# Patient Record
Sex: Female | Born: 1975
Health system: Southern US, Community
[De-identification: ages and names within clinical notes are randomized; demographics above are authoritative.]

## PROBLEM LIST (undated history)

## (undated) DIAGNOSIS — Z9889 Other specified postprocedural states: Secondary | ICD-10-CM

## (undated) DIAGNOSIS — C801 Malignant (primary) neoplasm, unspecified: Secondary | ICD-10-CM

## (undated) DIAGNOSIS — C50919 Malignant neoplasm of unspecified site of unspecified female breast: Secondary | ICD-10-CM

## (undated) DIAGNOSIS — Z9221 Personal history of antineoplastic chemotherapy: Secondary | ICD-10-CM

## (undated) DIAGNOSIS — Z923 Personal history of irradiation: Secondary | ICD-10-CM

## (undated) DIAGNOSIS — R112 Nausea with vomiting, unspecified: Secondary | ICD-10-CM

## (undated) HISTORY — PX: BREAST LUMPECTOMY: SHX2

---

## 2005-09-15 ENCOUNTER — Other Ambulatory Visit: Admission: RE | Admit: 2005-09-15 | Discharge: 2005-09-15 | Payer: Self-pay | Admitting: Obstetrics and Gynecology

## 2007-05-30 ENCOUNTER — Encounter (INDEPENDENT_AMBULATORY_CARE_PROVIDER_SITE_OTHER): Payer: Self-pay | Admitting: Obstetrics and Gynecology

## 2007-05-30 ENCOUNTER — Ambulatory Visit (HOSPITAL_COMMUNITY): Admission: RE | Admit: 2007-05-30 | Discharge: 2007-05-30 | Payer: Self-pay | Admitting: Obstetrics and Gynecology

## 2010-01-07 ENCOUNTER — Encounter (INDEPENDENT_AMBULATORY_CARE_PROVIDER_SITE_OTHER): Payer: Self-pay | Admitting: Obstetrics & Gynecology

## 2010-01-07 ENCOUNTER — Inpatient Hospital Stay (HOSPITAL_COMMUNITY): Admission: AD | Admit: 2010-01-07 | Discharge: 2010-01-10 | Payer: Self-pay | Admitting: Obstetrics and Gynecology

## 2010-11-22 LAB — CBC
HCT: 30.4 % — ABNORMAL LOW (ref 36.0–46.0)
HCT: 36.8 % (ref 36.0–46.0)
Hemoglobin: 10.9 g/dL — ABNORMAL LOW (ref 12.0–15.0)
Hemoglobin: 13.2 g/dL (ref 12.0–15.0)
MCHC: 35.7 g/dL (ref 30.0–36.0)
MCHC: 35.8 g/dL (ref 30.0–36.0)
MCV: 100.2 fL — ABNORMAL HIGH (ref 78.0–100.0)
MCV: 98.4 fL (ref 78.0–100.0)
Platelets: 220 10*3/uL (ref 150–400)
Platelets: 256 10*3/uL (ref 150–400)
RBC: 3.03 MIL/uL — ABNORMAL LOW (ref 3.87–5.11)
RBC: 3.74 MIL/uL — ABNORMAL LOW (ref 3.87–5.11)
RDW: 13.7 % (ref 11.5–15.5)
RDW: 13.8 % (ref 11.5–15.5)
WBC: 11.6 10*3/uL — ABNORMAL HIGH (ref 4.0–10.5)
WBC: 20.1 10*3/uL — ABNORMAL HIGH (ref 4.0–10.5)

## 2010-11-22 LAB — CCBB MATERNAL DONOR DRAW

## 2010-11-22 LAB — RPR: RPR Ser Ql: NONREACTIVE

## 2011-01-17 NOTE — Op Note (Signed)
NAME:  Autumn Bullock, Autumn Bullock               ACCOUNT NO.:  [id number]   MEDICAL RECORD NO.:  [id number]          PATIENT TYPE:  AMB   LOCATION:  SDC                           FACILITY:  [facility]   PHYSICIAN:  Miguel Aschoff, M.D.       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   PROCEDURE:  Cold knife conization of cervix followed by endocervical  curettage.   SURGEON:  Miguel Aschoff, MD   ANESTHESIA:  General.   COMPLICATIONS:  None.   JUSTIFICATION:  The patient is a 35 year old white female who had a Pap  smear evaluation done, which revealed a high-grade squamous  intraepithelial lesion, CIN III, could not rule out CIS.  Because of the  significant high-grade lesion on her cervix, she presents now to undergo  cold knife conization of the cervix for both therapeutic and diagnostic  purposes and to rule out invasive disease.  The risks and benefits of  this procedure were discussed with the patient.   PROCEDURE:  The patient was taken to the operating room and placed in a  supine position and general anesthesia was administered without  difficulty.  She was then placed into the dorsal lithotomy position,  prepped and draped in the usual sterile fashion.  Bladder was  catheterized.  After this was done, speculum was placed in the vaginal  vault and the anterior cervical lip was grasped with a tenaculum.  At  this point, figure-of-eight of 0 chromic were placed between the 2  o'clock and 4 o'clock positions and the 8 o'clock and 10 o'clock  positions to occlude the descending branch of the cervical artery.  At  this point, the cervix was stained with Lugol's solution.  There was  poor Lugol's solution at the transformation zone.  There was diffuse  uptake over the  major portion of the cervix.  At this point, the cervix  was injected with 1% Xylocaine with epinephrine for hemostasis.  After  this was done, a cone biopsy specimen was then cut without difficulty  and tagged at the 12 o'clock position with a Vicryl suture.  The  residual portion of the endocervix was then curetted using an  endocervical curette.  The bed of the cone biopsy site was then  cauterized with electrocautery; excellent hemostasis was achieved.  At  this point, a Gelfoam pack was placed into the defect and the previously  placed sutures were tied across the cervix to hold the Gelfoam in place.  The estimated blood loss was less than 10 mL.  The patient tolerated the  procedure well.   Plan is for the patient be discharged home.  Medications for home  include Darvocet-N 100 one every 4 hours as needed for pain, doxycycline  100 mg twice a day for 3 days.  She is to call if there are any  problems  such as fever, pain or heavy bleeding.  She will be seen back in 4 weeks  for a followup examination.  She was told to place nothing in vagina for  4 weeks.      Miguel Aschoff, M.D.  Electronically Signed     AR/MEDQ  D:  05/30/2007  T:  05/31/2007  Job:  [id number]

## 2011-06-15 LAB — CBC
HCT: 39.2
Hemoglobin: 13.9
MCHC: 35.4
MCV: 97.2
Platelets: 284
RBC: 4.04
RDW: 12.8
WBC: 6.1

## 2011-06-15 LAB — PREGNANCY, URINE: Preg Test, Ur: NEGATIVE

## 2013-06-05 ENCOUNTER — Other Ambulatory Visit: Payer: Self-pay | Admitting: Obstetrics and Gynecology

## 2014-08-10 ENCOUNTER — Other Ambulatory Visit: Payer: Self-pay | Admitting: Obstetrics and Gynecology

## 2014-08-12 LAB — CYTOLOGY - PAP

## 2015-09-15 ENCOUNTER — Other Ambulatory Visit: Payer: Self-pay | Admitting: Obstetrics and Gynecology

## 2015-09-16 LAB — CYTOLOGY - PAP

## 2016-10-04 ENCOUNTER — Other Ambulatory Visit: Payer: Self-pay | Admitting: Obstetrics and Gynecology

## 2016-10-05 LAB — CYTOLOGY - PAP

## 2020-06-07 ENCOUNTER — Other Ambulatory Visit: Payer: Self-pay | Admitting: Obstetrics and Gynecology

## 2020-06-07 DIAGNOSIS — R928 Other abnormal and inconclusive findings on diagnostic imaging of breast: Secondary | ICD-10-CM

## 2020-06-08 ENCOUNTER — Other Ambulatory Visit: Payer: Self-pay | Admitting: Obstetrics and Gynecology

## 2020-06-08 ENCOUNTER — Ambulatory Visit
Admission: RE | Admit: 2020-06-08 | Discharge: 2020-06-08 | Disposition: A | Discharge status: Home / Self Care | Payer: BC Managed Care – PPO | Source: Ambulatory Visit | Attending: Obstetrics and Gynecology | Admitting: Obstetrics and Gynecology

## 2020-06-08 ENCOUNTER — Other Ambulatory Visit: Payer: Self-pay

## 2020-06-08 DIAGNOSIS — R928 Other abnormal and inconclusive findings on diagnostic imaging of breast: Secondary | ICD-10-CM

## 2020-06-08 DIAGNOSIS — N6310 Unspecified lump in the right breast, unspecified quadrant: Secondary | ICD-10-CM

## 2020-06-09 ENCOUNTER — Telehealth: Payer: Self-pay | Admitting: Oncology

## 2020-06-09 NOTE — Telephone Encounter (Signed)
Spoke with patient to confirm morning [facility] appointment for 10/13, will email packet to patient, explained surgeon's office will call

## 2020-06-11 ENCOUNTER — Encounter: Payer: Self-pay | Admitting: *Deleted

## 2020-06-11 DIAGNOSIS — Z170 Estrogen receptor positive status [ER+]: Secondary | ICD-10-CM

## 2020-06-11 DIAGNOSIS — C50411 Malignant neoplasm of upper-outer quadrant of right female breast: Secondary | ICD-10-CM

## 2020-06-15 NOTE — Progress Notes (Signed)
Radiation Oncology         [phone] ________________________________  Initial Outpatient Consultation  Name: Autumn Bullock MRN: [id number]  Date: 06/16/2020  DOB: 10-22-1975  XF:GHWEXH, Maida, PA-C  Donnie Mesa, MD   REFERRING PHYSICIAN: Donnie Mesa, MD  DIAGNOSIS:    ICD-10-CM   1. Malignant neoplasm of upper-outer quadrant of right breast in female, estrogen receptor positive ([place])  C50.411    Z17.0     Cancer Staging Malignant neoplasm of upper-outer quadrant of right breast in female, estrogen receptor positive ([place]) Staging form: Breast, AJCC 8th Edition - Clinical stage from 06/16/2020: Stage IB (cT2, cN0, cM0, G2, ER+, PR+, HER2-) - Unsigned  CHIEF COMPLAINT: Here to discuss management of right breast cancer  HISTORY OF PRESENT ILLNESS:Autumn Bullock is a 44 y.o. female who presented with right breast abnormality on the following imaging: bilateral screening mammogram on the date of 06/02/2020. No symptoms were reported at that time. Ultrasound of breast on 06/08/2020 revealed a suspicious 2.3 cm mass in the 11:30 o'clock location of the right breast and a suspicious 0.8 cm spiculated mass superficial to the larger mass. No right axillary lymphadenopathy. Biopsy on the date of 06/08/2020 showed invasive mammary carcinoma with mammary carcinoma in situ. E-cadherin was positive, supporting a ductal origin. ER status: 90% strong; PR status: 70% strong, Her2 status negative; Grade 2.  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  She has received her Covid vaccines.  She previously had Covid before getting vaccinated.  PREVIOUS RADIATION THERAPY: No  PAST MEDICAL HISTORY:  has a past medical history of Family history of uterine cancer (06/16/2020).    PAST SURGICAL HISTORY: Past Surgical History:  Procedure Laterality Date  . CESAREAN SECTION     2011    FAMILY HISTORY: family history includes Cancer (age of onset: 85) in her  maternal grandmother.  SOCIAL HISTORY:  reports that she has never smoked. She does not have any smokeless tobacco history on file. She reports current alcohol use. She reports that she does not use drugs.  ALLERGIES: Patient has no known allergies.  MEDICATIONS:  No current outpatient medications on file.   No current facility-administered medications for this encounter.    REVIEW OF SYSTEMS: As above.   PHYSICAL EXAM:  vitals were not taken for this visit.   General: Alert and oriented, in no acute distress HEENT: Head is normocephalic. Extraocular movements are intact.  Heart: Regular in rate and rhythm with no murmurs, rubs, or gallops. Chest: Clear to auscultation bilaterally, with no rhonchi, wheezes, or rales. Musculoskeletal: symmetric strength and muscle tone throughout. Neurologic:  No obvious focalities. Speech is fluent. Coordination is intact. Psychiatric: Judgment and insight are intact. Affect is appropriate. Breasts: Notable for at least 2 masses in the upper right breast with overlying ecchymoses; no other palpable masses appreciated in the breasts or axillae bilaterally.   ECOG = 0  0 - Asymptomatic (Fully active, able to carry on all predisease activities without restriction)  1 - Symptomatic but completely ambulatory (Restricted in physically strenuous activity but ambulatory and able to carry out work of a light or sedentary nature. For example, light housework, office work)  2 - Symptomatic, <50% in bed during the day (Ambulatory and capable of all self care but unable to carry out any work activities. Up and about more than 50% of waking hours)  3 - Symptomatic, >50% in bed, but not bedbound (Capable of only limited self-care, confined to bed  or chair 50% or more of waking hours)  4 - Bedbound (Completely disabled. Cannot carry on any self-care. Totally confined to bed or chair)  5 - Death   Santiago Glad MM, Creech RH, Tormey DC, et al. [phone]). "Toxicity and  response criteria of the [facility]". Am. Evlyn Clines. Oncol. 5 (6): 649-55   LABORATORY DATA:  Lab Results  Component Value Date   WBC 6.0 06/16/2020   HGB 13.5 06/16/2020   HCT 39.5 06/16/2020   MCV 96.6 06/16/2020   PLT 263 06/16/2020   CMP     Component Value Date/Time   NA 140 06/16/2020 0819   K 4.1 06/16/2020 0819   CL 104 06/16/2020 0819   CO2 30 06/16/2020 0819   GLUCOSE 123 (H) 06/16/2020 0819   BUN 15 06/16/2020 0819   CREATININE 0.83 06/16/2020 0819   CALCIUM 9.5 06/16/2020 0819   PROT 7.6 06/16/2020 0819   ALBUMIN 4.4 06/16/2020 0819   AST 18 06/16/2020 0819   ALT 15 06/16/2020 0819   ALKPHOS 64 06/16/2020 0819   BILITOT 0.4 06/16/2020 0819   GFRNONAA >60 06/16/2020 0819         RADIOGRAPHY: US BREAST LTD UNI RIGHT INC AXILLA  Result Date: 06/08/2020 CLINICAL DATA:  Patient returns after screening for evaluation possible RIGHT breast mass. EXAM: DIGITAL DIAGNOSTIC RIGHT MAMMOGRAM WITH CAD AND TOMO ULTRASOUND RIGHT BREAST COMPARISON:  Previous exam(s). ACR Breast Density Category c: The breast tissue is heterogeneously dense, which may obscure small masses. FINDINGS: Additional 2-D and 3-D images are performed. These views confirm presence of a spiculated mass in the UPPER-OUTER QUADRANT of the RIGHT breast. No definitive abnormality identified in the retroareolar region. Mammographic images were processed with CAD. On physical exam, I palpate focal soft thickening in the 11:30 location of the RIGHT breast. I palpate no discrete mass. Targeted ultrasound is performed, showing irregular mass with indistinct margins in the 11:30 o'clock location of the RIGHT breast 3 centimeters from the nipple. Mass is 2.3 x 1.5 x 1.7 centimeters. No blood flow identified on Doppler evaluation. Just superficial to this mass, also in the 11:30 o'clock location 3 centimeters from the nipple, there is a spiculated solid mass with internal blood flow measuring 0.8 x 0.6  x 0.7 centimeters. In the 11:30 o'clock location of the RIGHT breast 4 centimeters from the nipple, there is an anechoic mass with internal septations measuring 0.8 x 0.5 x 0.8 centimeters. In the 12 o'clock location 3 centimeters from the nipple, there is a simple cyst measuring 0.6 centimeters. Evaluation of the RIGHT axilla shows no adenopathy. IMPRESSION: 1. Suspicious 2.3 centimeter mass in the 11:30 o'clock location of the RIGHT breast warranting tissue diagnosis. 2. Suspicious 0.8 centimeter spiculated mass superficial to the larger mass also warranting tissue diagnosis. 3. No RIGHT axillary adenopathy. RECOMMENDATION: 1. Recommend ultrasound-guided core biopsy of mass in the 11:30 o'clock location of the RIGHT breast. 2. Recommend ultrasound-guided core biopsy of more superficial mass in the 11:30 o'clock location of the RIGHT breast. 3. Biopsies will be performed later today. I have discussed the findings and recommendations with the patient. If applicable, a reminder letter will be sent to the patient regarding the next appointment. BI-RADS CATEGORY  4: Suspicious. Electronically Signed   By: Norva Pavlov M.D.   On: 06/08/2020 12:20   MM DIAG BREAST TOMO UNI RIGHT  Result Date: 06/08/2020 CLINICAL DATA:  Patient returns after screening for evaluation possible RIGHT breast mass. EXAM: DIGITAL DIAGNOSTIC RIGHT  MAMMOGRAM WITH CAD AND TOMO ULTRASOUND RIGHT BREAST COMPARISON:  Previous exam(s). ACR Breast Density Category c: The breast tissue is heterogeneously dense, which may obscure small masses. FINDINGS: Additional 2-D and 3-D images are performed. These views confirm presence of a spiculated mass in the UPPER-OUTER QUADRANT of the RIGHT breast. No definitive abnormality identified in the retroareolar region. Mammographic images were processed with CAD. On physical exam, I palpate focal soft thickening in the 11:30 location of the RIGHT breast. I palpate no discrete mass. Targeted ultrasound is  performed, showing irregular mass with indistinct margins in the 11:30 o'clock location of the RIGHT breast 3 centimeters from the nipple. Mass is 2.3 x 1.5 x 1.7 centimeters. No blood flow identified on Doppler evaluation. Just superficial to this mass, also in the 11:30 o'clock location 3 centimeters from the nipple, there is a spiculated solid mass with internal blood flow measuring 0.8 x 0.6 x 0.7 centimeters. In the 11:30 o'clock location of the RIGHT breast 4 centimeters from the nipple, there is an anechoic mass with internal septations measuring 0.8 x 0.5 x 0.8 centimeters. In the 12 o'clock location 3 centimeters from the nipple, there is a simple cyst measuring 0.6 centimeters. Evaluation of the RIGHT axilla shows no adenopathy. IMPRESSION: 1. Suspicious 2.3 centimeter mass in the 11:30 o'clock location of the RIGHT breast warranting tissue diagnosis. 2. Suspicious 0.8 centimeter spiculated mass superficial to the larger mass also warranting tissue diagnosis. 3. No RIGHT axillary adenopathy. RECOMMENDATION: 1. Recommend ultrasound-guided core biopsy of mass in the 11:30 o'clock location of the RIGHT breast. 2. Recommend ultrasound-guided core biopsy of more superficial mass in the 11:30 o'clock location of the RIGHT breast. 3. Biopsies will be performed later today. I have discussed the findings and recommendations with the patient. If applicable, a reminder letter will be sent to the patient regarding the next appointment. BI-RADS CATEGORY  4: Suspicious. Electronically Signed   By: Nolon Nations M.D.   On: 06/08/2020 12:20   MM CLIP PLACEMENT RIGHT  Result Date: 06/08/2020 CLINICAL DATA:  Status post ultrasound-guided core biopsies of 2 masses in the RIGHT breast at the 11:30 o'clock axis. EXAM: DIAGNOSTIC RIGHT MAMMOGRAM POST ULTRASOUND BIOPSY x2 COMPARISON:  Previous exam(s). FINDINGS: Mammographic images were obtained following ultrasound guided biopsy of 2 adjacent masses in the RIGHT breast  at the 11:30 o'clock axis. Both biopsy marking clips are in expected position at the sites of the biopsies. IMPRESSION: 1. Appropriate positioning of the ribbon shaped biopsy marking clip at the site of biopsy in the upper-outer quadrant of the RIGHT breast. 2. Appropriate positioning of the coil shaped biopsy marking clip at the site of biopsy in the upper-outer quadrant of the RIGHT breast. Final Assessment: Post Procedure Mammograms for Marker Placement Electronically Signed   By: Franki Cabot M.D.   On: 06/08/2020 16:03   [place] RT BREAST BX W LOC DEV 1ST LESION IMG BX SPEC US GUIDE  Addendum Date: 06/10/2020   ADDENDUM REPORT: 06/09/2020 11:16 ADDENDUM: Pathology revealed GRADE II INVASIVE MAMMARY CARCINOMA, MAMMARY CARCINOMA IN SITU of the RIGHT breast, 11:30 o'clock axis. This was found to be concordant by Dr. Franki Cabot. Pathology revealed GRADE II INVASIVE MAMMARY CARCINOMA, MAMMARY CARCINOMA IN SITU of the RIGHT breast, 11:30 o'clock axis. This was found to be concordant by Dr. Franki Cabot. Pathology results were discussed with the patient by telephone. The patient reported doing well after the biopsies with tenderness at the sites. Post biopsy instructions and care were reviewed and  questions were answered. The patient was encouraged to call [facility] for any additional concerns. The patient was referred to [facility] at [facility] on June 16, 2020. Pathology results reported by Stacie Acres RN on 06/09/2020. Electronically Signed   By: Franki Cabot M.D.   On: 06/09/2020 11:16   Result Date: 06/10/2020 CLINICAL DATA:  Patient with 2 adjacent suspicious masses within the outer RIGHT breast, both at the 11:30 o'clock axis, presents for ultrasound-guided core biopsies of each. EXAM: ULTRASOUND GUIDED RIGHT BREAST CORE NEEDLE BIOPSY x2 COMPARISON:  Previous exam(s). PROCEDURE: I met with the patient and we  discussed the procedure of ultrasound-guided biopsy, including benefits and alternatives. We discussed the high likelihood of a successful procedure. We discussed the risks of the procedure, including infection, bleeding, tissue injury, clip migration, and inadequate sampling. Informed written consent was given. The usual time-out protocol was performed immediately prior to the procedure. Site 1: Lesion quadrant: Upper outer quadrant Using sterile technique and 1% Lidocaine as local anesthetic, under direct ultrasound visualization, a 12 gauge spring-loaded device was used to perform biopsy of the smaller more superficial RIGHT breast mass at the 11:30 o'clock axis using a lateral approach. At the conclusion of the procedure ribbon shaped tissue marker clip was deployed into the biopsy cavity. Site 2: Lesion quadrant: Upper outer quadrant Using sterile technique and 1% Lidocaine as local anesthetic, under direct ultrasound visualization, a 12 gauge spring-loaded device was used to perform biopsy of the larger deeper mass in the RIGHT breast at the 11:30 o'clock using a lateral approach. At the conclusion of the procedure coil shaped tissue marker clip was deployed into the biopsy cavity. Follow up 2 view mammogram was performed and dictated separately. IMPRESSION: 1. Ultrasound guided biopsy of the smaller more superficial mass in the RIGHT breast at the 11:30 o'clock axis (ribbon clip). No apparent complications. 2. Ultrasound guided biopsy of the larger deeper mass in the RIGHT breast at the 11:30 o'clock axis (coil clip). No apparent complications. Electronically Signed: By: Franki Cabot M.D. On: 06/08/2020 15:49   [place] RT BREAST BX W LOC DEV EA ADD LESION IMG BX SPEC US GUIDE  Addendum Date: 06/10/2020   ADDENDUM REPORT: 06/09/2020 11:16 ADDENDUM: Pathology revealed GRADE II INVASIVE MAMMARY CARCINOMA, MAMMARY CARCINOMA IN SITU of the RIGHT breast, 11:30 o'clock axis. This was found to be concordant by Dr.  Franki Cabot. Pathology revealed GRADE II INVASIVE MAMMARY CARCINOMA, MAMMARY CARCINOMA IN SITU of the RIGHT breast, 11:30 o'clock axis. This was found to be concordant by Dr. Franki Cabot. Pathology results were discussed with the patient by telephone. The patient reported doing well after the biopsies with tenderness at the sites. Post biopsy instructions and care were reviewed and questions were answered. The patient was encouraged to call [facility] for any additional concerns. The patient was referred to [facility] on June 16, 2020. Pathology results reported by Stacie Acres RN on 06/09/2020. Electronically Signed   By: Franki Cabot M.D.   On: 06/09/2020 11:16   Result Date: 06/10/2020 CLINICAL DATA:  Patient with 2 adjacent suspicious masses within the outer RIGHT breast, both at the 11:30 o'clock axis, presents for ultrasound-guided core biopsies of each. EXAM: ULTRASOUND GUIDED RIGHT BREAST CORE NEEDLE BIOPSY x2 COMPARISON:  Previous exam(s). PROCEDURE: I met with the patient and we discussed the procedure of  ultrasound-guided biopsy, including benefits and alternatives. We discussed the high likelihood of a successful procedure. We discussed the risks of the procedure, including infection, bleeding, tissue injury, clip migration, and inadequate sampling. Informed written consent was given. The usual time-out protocol was performed immediately prior to the procedure. Site 1: Lesion quadrant: Upper outer quadrant Using sterile technique and 1% Lidocaine as local anesthetic, under direct ultrasound visualization, a 12 gauge spring-loaded device was used to perform biopsy of the smaller more superficial RIGHT breast mass at the 11:30 o'clock axis using a lateral approach. At the conclusion of the procedure ribbon shaped tissue marker clip was deployed into the biopsy cavity. Site 2: Lesion quadrant:  Upper outer quadrant Using sterile technique and 1% Lidocaine as local anesthetic, under direct ultrasound visualization, a 12 gauge spring-loaded device was used to perform biopsy of the larger deeper mass in the RIGHT breast at the 11:30 o'clock using a lateral approach. At the conclusion of the procedure coil shaped tissue marker clip was deployed into the biopsy cavity. Follow up 2 view mammogram was performed and dictated separately. IMPRESSION: 1. Ultrasound guided biopsy of the smaller more superficial mass in the RIGHT breast at the 11:30 o'clock axis (ribbon clip). No apparent complications. 2. Ultrasound guided biopsy of the larger deeper mass in the RIGHT breast at the 11:30 o'clock axis (coil clip). No apparent complications. Electronically Signed: By: Franki Cabot M.D. On: 06/08/2020 15:49      IMPRESSION/PLAN: Right Breast Cancer  The patient was discussed at tumor board.  The consensus is that she will be referred to genetic counseling.  She will undergo an MRI of her breasts for further staging.  Anticipate MammaPrint testing to discern her disposition regarding chemotherapy.  If MRI demonstrates that breast conserving surgery is possible the patient is enthusiastic about that.  Anticipate adjuvant radiotherapy in the setting of breast conservation.  It was a pleasure meeting the patient today. We discussed the risks, benefits, and side effects of radiotherapy. I recommend radiotherapy to the right breast to reduce her risk of locoregional recurrence by 2/3.  We discussed that radiation would take approximately 4 weeks to complete and that I would give the patient a few weeks to heal following surgery before starting treatment planning.  If chemotherapy were to be given, this would precede radiotherapy. We spoke about acute effects including skin irritation and fatigue as well as much less common late effects including internal organ injury or irritation. We spoke about the latest technology  that is used to minimize the risk of late effects for patients undergoing radiotherapy to the breast or chest wall. No guarantees of treatment were given. The patient is enthusiastic about proceeding with treatment. I look forward to participating in the patient's care.  I will await her referral back to me for postoperative follow-up and eventual CT simulation/treatment planning.  On date of service, in total, I spent 32 minutes on this encounter. Patient was seen in person.   __________________________________________   Eppie Gibson, MD  This document serves as a record of services personally performed by Eppie Gibson, MD. It was created on his behalf by Clerance Lav, a trained medical scribe. The creation of this record is based on the scribe's personal observations and the provider's statements to them. This document has been checked and approved by the attending provider.

## 2020-06-15 NOTE — Progress Notes (Signed)
[place]  [phone] [phone]     ID: Autumn Bullock DOB: [birth year]  MR#: [id number]  KGY#:[id number]  Patient Care Team: Brock Ra, PA-C as PCP - General Mauro Kaufmann, RN as Oncology Nurse Navigator Rockwell Germany, RN as Oncology Nurse Navigator Donnie Mesa, MD as Consulting Physician (General Surgery) Eppie Gibson, MD as Attending Physician (Radiation Oncology) Mahagony Grieb, Virgie Dad, MD as Consulting Physician (Oncology) Vanessa Kick, MD as Consulting Physician (Obstetrics and Gynecology) Lovett Calender, MD as Consulting Physician (Orthopedic Surgery) Chauncey Cruel, MD OTHER MD:  CHIEF COMPLAINT: Estrogen receptor positive breast cancer  CURRENT TREATMENT: Awaiting definitive surgery   HISTORY OF CURRENT ILLNESS: Hang had routine screening mammography on 06/02/2020 showing a possible abnormality in the right breast. She underwent right diagnostic mammography with tomography and right breast ultrasonography at [facility] on 06/08/2020 showing: breast density category C; 2.3 cm right breast mass at 11:30; 0.8 cm mass superficial to dominant mass; no right axillary adenopathy.  Accordingly on 06/08/2020 she proceeded to biopsy of the right breast area in question. The pathology from this procedure ([id number]) showed: invasive and in situ mammary carcinoma, grade 2, e-cadherin positive. Both biopsied masses showed this and were found to be morphologically similar. Prognostic indicators significant for: estrogen receptor, 90% positive and progesterone receptor, 70% positive, both with strong staining intensity. Proliferation marker Ki67 at 5%. HER2 negative by immunohistochemistry (1+).  The patient's subsequent history is as detailed below.   INTERVAL HISTORY: Autumn Bullock was evaluated in the multidisciplinary breast cancer clinic on 06/16/2020 accompanied by her husband [name]. Her case was also presented at the  multidisciplinary breast cancer conference on the same day. At that time a preliminary plan was proposed: Genetics testing, MRI, definitive surgery, MammaPrint, adjuvant radiation, antiestrogens   REVIEW OF SYSTEMS: There were no specific symptoms leading to the original mammogram, which was routinely scheduled. The patient denies unusual headaches, visual changes, nausea, vomiting, stiff neck, dizziness, or gait imbalance. There has been no cough, phlegm production, or pleurisy, no chest pain or pressure, and no change in bowel or bladder habits. The patient denies fever, rash, bleeding, unexplained fatigue or unexplained weight loss.  Lakesia exercises by going to the gym about 3 times a week and of course she is on her feet all day at work.  She had Covid and also has had both Pfizer vaccine doses.  A detailed review of systems was otherwise entirely negative.   PAST MEDICAL HISTORY: No past medical history on file.  PAST SURGICAL HISTORY: Past Surgical History:  Procedure Laterality Date  . CESAREAN SECTION     2011    FAMILY HISTORY: Family History  Problem Relation Age of Onset  . Uterine cancer Maternal Grandmother   As of October 2021 her parents are both living, her father at age 82 and her mother at 11, . Millenia has 1 brother and 2 sisters. She reports uterine cancer in her maternal grandmother at age 33. There is no family history of breast, ovarian, or colon cancer to her knowledge.   GYNECOLOGIC HISTORY:  No LMP recorded. Menarche: 44 years old Age at first live birth: 44 years old [place] P 1 LMP 05/05/2020, regular monthly periods, lasting 3 days with 1-2 heavy days Contraceptive: has used for 15 years, no complications HRT n/a  Hysterectomy? no BSO? 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 Husband [name] works in Nature conservation officer. She  lives at home with [name] and their daughter [name], age 16. She  attends [employer] in [place].    ADVANCED DIRECTIVES: In the absence of any documentation to the contrary, the patient's spouse is their HCPOA.    HEALTH MAINTENANCE: Social History   Tobacco Use  . Smoking status: Never Smoker  Substance Use Topics  . Alcohol use: Yes    Comment: 5/week  . Drug use: Never     Colonoscopy: 1999?  PAP: 05/31/2020  Bone density: never done   No Known Allergies  No current outpatient medications on file.   No current facility-administered medications for this visit.    OBJECTIVE: White woman who appears well  Vitals:   06/16/20 0857  BP: 115/76  Pulse: 73  Resp: 19  Temp: 98.2 F (36.8 C)  SpO2: 98%     Body mass index is 25.97 kg/m.   Wt Readings from Last 3 Encounters:  06/16/20 158 lb 8 oz (71.9 kg)      ECOG FS:0 - Asymptomatic  Ocular: Sclerae unicteric, pupils round and equal Ear-nose-throat: Wearing a mask Lymphatic: No cervical or supraclavicular adenopathy Lungs no rales or rhonchi Heart regular rate and rhythm Abd soft, nontender, positive bowel sounds MSK no focal spinal tenderness, no joint edema Neuro: non-focal, well-oriented, appropriate affect Breasts: The right breast is status post recent biopsy.  There is a significant ecchymosis.  Left breast is benign.  Both axillae are benign.   LAB RESULTS:  CMP     Component Value Date/Time   NA 140 06/16/2020 0819   K 4.1 06/16/2020 0819   CL 104 06/16/2020 0819   CO2 30 06/16/2020 0819   GLUCOSE 123 (H) 06/16/2020 0819   BUN 15 06/16/2020 0819   CREATININE 0.83 06/16/2020 0819   CALCIUM 9.5 06/16/2020 0819   PROT 7.6 06/16/2020 0819   ALBUMIN 4.4 06/16/2020 0819   AST 18 06/16/2020 0819   ALT 15 06/16/2020 0819   ALKPHOS 64 06/16/2020 0819   BILITOT 0.4 06/16/2020 0819   GFRNONAA >60 06/16/2020 0819    No results found for: TOTALPROTELP, ALBUMINELP, A1GS, A2GS, BETS, BETA2SER, GAMS, MSPIKE, SPEI  Lab Results  Component Value Date   WBC  6.0 06/16/2020   NEUTROABS 4.1 06/16/2020   HGB 13.5 06/16/2020   HCT 39.5 06/16/2020   MCV 96.6 06/16/2020   PLT 263 06/16/2020    No results found for: LABCA2  No components found for: OQHUTM546  No results for input(s): INR in the last 168 hours.  No results found for: LABCA2  No results found for: TKP546  No results found for: FKC127  No results found for: NTZ001  No results found for: CA2729  No components found for: HGQUANT  No results found for: CEA1 / No results found for: CEA1   No results found for: AFPTUMOR  No results found for: CHROMOGRNA  No results found for: KPAFRELGTCHN, LAMBDASER, KAPLAMBRATIO (kappa/lambda light chains)  No results found for: HGBA, HGBA2QUANT, HGBFQUANT, HGBSQUAN (Hemoglobinopathy evaluation)   No results found for: LDH  No results found for: IRON, TIBC, IRONPCTSAT (Iron and TIBC)  No results found for: FERRITIN  Urinalysis No results found for: COLORURINE, APPEARANCEUR, LABSPEC, PHURINE, GLUCOSEU, HGBUR, BILIRUBINUR, KETONESUR, PROTEINUR, UROBILINOGEN, NITRITE, LEUKOCYTESUR   STUDIES: US BREAST LTD UNI RIGHT INC AXILLA  Result Date: 06/08/2020 CLINICAL DATA:  Patient returns after screening for evaluation possible RIGHT breast mass. EXAM: DIGITAL DIAGNOSTIC RIGHT MAMMOGRAM WITH CAD AND TOMO ULTRASOUND RIGHT BREAST COMPARISON:  Previous exam(s).  ACR Breast Density Category c: The breast tissue is heterogeneously dense, which may obscure small masses. FINDINGS: Additional 2-D and 3-D images are performed. These views confirm presence of a spiculated mass in the UPPER-OUTER QUADRANT of the RIGHT breast. No definitive abnormality identified in the retroareolar region. Mammographic images were processed with CAD. On physical exam, I palpate focal soft thickening in the 11:30 location of the RIGHT breast. I palpate no discrete mass. Targeted ultrasound is performed, showing irregular mass with indistinct margins in the 11:30 o'clock  location of the RIGHT breast 3 centimeters from the nipple. Mass is 2.3 x 1.5 x 1.7 centimeters. No blood flow identified on Doppler evaluation. Just superficial to this mass, also in the 11:30 o'clock location 3 centimeters from the nipple, there is a spiculated solid mass with internal blood flow measuring 0.8 x 0.6 x 0.7 centimeters. In the 11:30 o'clock location of the RIGHT breast 4 centimeters from the nipple, there is an anechoic mass with internal septations measuring 0.8 x 0.5 x 0.8 centimeters. In the 12 o'clock location 3 centimeters from the nipple, there is a simple cyst measuring 0.6 centimeters. Evaluation of the RIGHT axilla shows no adenopathy. IMPRESSION: 1. Suspicious 2.3 centimeter mass in the 11:30 o'clock location of the RIGHT breast warranting tissue diagnosis. 2. Suspicious 0.8 centimeter spiculated mass superficial to the larger mass also warranting tissue diagnosis. 3. No RIGHT axillary adenopathy. RECOMMENDATION: 1. Recommend ultrasound-guided core biopsy of mass in the 11:30 o'clock location of the RIGHT breast. 2. Recommend ultrasound-guided core biopsy of more superficial mass in the 11:30 o'clock location of the RIGHT breast. 3. Biopsies will be performed later today. I have discussed the findings and recommendations with the patient. If applicable, a reminder letter will be sent to the patient regarding the next appointment. BI-RADS CATEGORY  4: Suspicious. Electronically Signed   By: Nolon Nations M.D.   On: 06/08/2020 12:20   MM DIAG BREAST TOMO UNI RIGHT  Result Date: 06/08/2020 CLINICAL DATA:  Patient returns after screening for evaluation possible RIGHT breast mass. EXAM: DIGITAL DIAGNOSTIC RIGHT MAMMOGRAM WITH CAD AND TOMO ULTRASOUND RIGHT BREAST COMPARISON:  Previous exam(s). ACR Breast Density Category c: The breast tissue is heterogeneously dense, which may obscure small masses. FINDINGS: Additional 2-D and 3-D images are performed. These views confirm presence of a  spiculated mass in the UPPER-OUTER QUADRANT of the RIGHT breast. No definitive abnormality identified in the retroareolar region. Mammographic images were processed with CAD. On physical exam, I palpate focal soft thickening in the 11:30 location of the RIGHT breast. I palpate no discrete mass. Targeted ultrasound is performed, showing irregular mass with indistinct margins in the 11:30 o'clock location of the RIGHT breast 3 centimeters from the nipple. Mass is 2.3 x 1.5 x 1.7 centimeters. No blood flow identified on Doppler evaluation. Just superficial to this mass, also in the 11:30 o'clock location 3 centimeters from the nipple, there is a spiculated solid mass with internal blood flow measuring 0.8 x 0.6 x 0.7 centimeters. In the 11:30 o'clock location of the RIGHT breast 4 centimeters from the nipple, there is an anechoic mass with internal septations measuring 0.8 x 0.5 x 0.8 centimeters. In the 12 o'clock location 3 centimeters from the nipple, there is a simple cyst measuring 0.6 centimeters. Evaluation of the RIGHT axilla shows no adenopathy. IMPRESSION: 1. Suspicious 2.3 centimeter mass in the 11:30 o'clock location of the RIGHT breast warranting tissue diagnosis. 2. Suspicious 0.8 centimeter spiculated mass superficial to the larger mass  also warranting tissue diagnosis. 3. No RIGHT axillary adenopathy. RECOMMENDATION: 1. Recommend ultrasound-guided core biopsy of mass in the 11:30 o'clock location of the RIGHT breast. 2. Recommend ultrasound-guided core biopsy of more superficial mass in the 11:30 o'clock location of the RIGHT breast. 3. Biopsies will be performed later today. I have discussed the findings and recommendations with the patient. If applicable, a reminder letter will be sent to the patient regarding the next appointment. BI-RADS CATEGORY  4: Suspicious. Electronically Signed   By: Nolon Nations M.D.   On: 06/08/2020 12:20   MM CLIP PLACEMENT RIGHT  Result Date: 06/08/2020 CLINICAL  DATA:  Status post ultrasound-guided core biopsies of 2 masses in the RIGHT breast at the 11:30 o'clock axis. EXAM: DIAGNOSTIC RIGHT MAMMOGRAM POST ULTRASOUND BIOPSY x2 COMPARISON:  Previous exam(s). FINDINGS: Mammographic images were obtained following ultrasound guided biopsy of 2 adjacent masses in the RIGHT breast at the 11:30 o'clock axis. Both biopsy marking clips are in expected position at the sites of the biopsies. IMPRESSION: 1. Appropriate positioning of the ribbon shaped biopsy marking clip at the site of biopsy in the upper-outer quadrant of the RIGHT breast. 2. Appropriate positioning of the coil shaped biopsy marking clip at the site of biopsy in the upper-outer quadrant of the RIGHT breast. Final Assessment: Post Procedure Mammograms for Marker Placement Electronically Signed   By: Franki Cabot M.D.   On: 06/08/2020 16:03   [place] RT BREAST BX W LOC DEV 1ST LESION IMG BX SPEC US GUIDE  Addendum Date: 06/10/2020   ADDENDUM REPORT: 06/09/2020 11:16 ADDENDUM: Pathology revealed GRADE II INVASIVE MAMMARY CARCINOMA, MAMMARY CARCINOMA IN SITU of the RIGHT breast, 11:30 o'clock axis. This was found to be concordant by Dr. Franki Cabot. Pathology revealed GRADE II INVASIVE MAMMARY CARCINOMA, MAMMARY CARCINOMA IN SITU of the RIGHT breast, 11:30 o'clock axis. This was found to be concordant by Dr. Franki Cabot. Pathology results were discussed with the patient by telephone. The patient reported doing well after the biopsies with tenderness at the sites. Post biopsy instructions and care were reviewed and questions were answered. The patient was encouraged to call [facility] for any additional concerns. The patient was referred to [facility] on June 16, 2020. Pathology results reported by Stacie Acres RN on 06/09/2020. Electronically Signed   By: Franki Cabot M.D.   On: 06/09/2020 11:16   Result Date:  06/10/2020 CLINICAL DATA:  Patient with 2 adjacent suspicious masses within the outer RIGHT breast, both at the 11:30 o'clock axis, presents for ultrasound-guided core biopsies of each. EXAM: ULTRASOUND GUIDED RIGHT BREAST CORE NEEDLE BIOPSY x2 COMPARISON:  Previous exam(s). PROCEDURE: I met with the patient and we discussed the procedure of ultrasound-guided biopsy, including benefits and alternatives. We discussed the high likelihood of a successful procedure. We discussed the risks of the procedure, including infection, bleeding, tissue injury, clip migration, and inadequate sampling. Informed written consent was given. The usual time-out protocol was performed immediately prior to the procedure. Site 1: Lesion quadrant: Upper outer quadrant Using sterile technique and 1% Lidocaine as local anesthetic, under direct ultrasound visualization, a 12 gauge spring-loaded device was used to perform biopsy of the smaller more superficial RIGHT breast mass at the 11:30 o'clock axis using a lateral approach. At the conclusion of the procedure ribbon shaped tissue marker clip was deployed into the biopsy cavity. Site 2: Lesion quadrant: Upper outer quadrant Using sterile technique and  1% Lidocaine as local anesthetic, under direct ultrasound visualization, a 12 gauge spring-loaded device was used to perform biopsy of the larger deeper mass in the RIGHT breast at the 11:30 o'clock using a lateral approach. At the conclusion of the procedure coil shaped tissue marker clip was deployed into the biopsy cavity. Follow up 2 view mammogram was performed and dictated separately. IMPRESSION: 1. Ultrasound guided biopsy of the smaller more superficial mass in the RIGHT breast at the 11:30 o'clock axis (ribbon clip). No apparent complications. 2. Ultrasound guided biopsy of the larger deeper mass in the RIGHT breast at the 11:30 o'clock axis (coil clip). No apparent complications. Electronically Signed: By: Franki Cabot M.D. On:  06/08/2020 15:49   [place] RT BREAST BX W LOC DEV EA ADD LESION IMG BX SPEC US GUIDE  Addendum Date: 06/10/2020   ADDENDUM REPORT: 06/09/2020 11:16 ADDENDUM: Pathology revealed GRADE II INVASIVE MAMMARY CARCINOMA, MAMMARY CARCINOMA IN SITU of the RIGHT breast, 11:30 o'clock axis. This was found to be concordant by Dr. Franki Cabot. Pathology revealed GRADE II INVASIVE MAMMARY CARCINOMA, MAMMARY CARCINOMA IN SITU of the RIGHT breast, 11:30 o'clock axis. This was found to be concordant by Dr. Franki Cabot. Pathology results were discussed with the patient by telephone. The patient reported doing well after the biopsies with tenderness at the sites. Post biopsy instructions and care were reviewed and questions were answered. The patient was encouraged to call [facility] for any additional concerns. The patient was referred to [facility] on June 16, 2020. Pathology results reported by Stacie Acres RN on 06/09/2020. Electronically Signed   By: Franki Cabot M.D.   On: 06/09/2020 11:16   Result Date: 06/10/2020 CLINICAL DATA:  Patient with 2 adjacent suspicious masses within the outer RIGHT breast, both at the 11:30 o'clock axis, presents for ultrasound-guided core biopsies of each. EXAM: ULTRASOUND GUIDED RIGHT BREAST CORE NEEDLE BIOPSY x2 COMPARISON:  Previous exam(s). PROCEDURE: I met with the patient and we discussed the procedure of ultrasound-guided biopsy, including benefits and alternatives. We discussed the high likelihood of a successful procedure. We discussed the risks of the procedure, including infection, bleeding, tissue injury, clip migration, and inadequate sampling. Informed written consent was given. The usual time-out protocol was performed immediately prior to the procedure. Site 1: Lesion quadrant: Upper outer quadrant Using sterile technique and 1% Lidocaine as local anesthetic, under direct  ultrasound visualization, a 12 gauge spring-loaded device was used to perform biopsy of the smaller more superficial RIGHT breast mass at the 11:30 o'clock axis using a lateral approach. At the conclusion of the procedure ribbon shaped tissue marker clip was deployed into the biopsy cavity. Site 2: Lesion quadrant: Upper outer quadrant Using sterile technique and 1% Lidocaine as local anesthetic, under direct ultrasound visualization, a 12 gauge spring-loaded device was used to perform biopsy of the larger deeper mass in the RIGHT breast at the 11:30 o'clock using a lateral approach. At the conclusion of the procedure coil shaped tissue marker clip was deployed into the biopsy cavity. Follow up 2 view mammogram was performed and dictated separately. IMPRESSION: 1. Ultrasound guided biopsy of the smaller more superficial mass in the RIGHT breast at the 11:30 o'clock axis (ribbon clip). No apparent complications. 2. Ultrasound guided biopsy of the larger deeper mass in the RIGHT breast at the 11:30 o'clock axis (coil clip). No apparent complications. Electronically Signed: By: Franki Cabot M.D. On: 06/08/2020  15:49     ELIGIBLE FOR AVAILABLE RESEARCH PROTOCOL: AET  ASSESSMENT: 44 y.o. [place] woman status post right breast upper outer quadrant biopsy 06/08/2020 for a clinical mT2 N0, stage IB invasive ductal carcinoma, grade 2, estrogen and progesterone receptor positive, HER-2 not amplified, with an MIB-1 of 5%.  (1) genetics testing pending  (2) definitive surgery pending  (3) MammaPrint to be obtained from the definitive surgical sample  (4) adjuvant radiation to follow  (5) antiestrogens to start at the completion of local treatment   PLAN: I met today with Vi to review her new diagnosis. Specifically we discussed the biology of her breast cancer, its diagnosis, staging, treatment  options and prognosis.We first reviewed the fact that cancer is not one disease but more than 100  different diseases and that it is important to keep them separate-- otherwise when friends and relatives discuss their own cancer experiences with [place] confusion can result. Similarly we explained that if breast cancer spreads to the bone or liver, the patient would not have bone cancer or liver cancer, but breast cancer in the bone and breast cancer in the liver: one cancer in three places-- not 3 different cancers which otherwise would have to be treated in 3 different ways.  We discussed the difference between local and systemic therapy. In terms of loco-regional treatment, lumpectomy plus radiation is equivalent to mastectomy as far as survival is concerned. For this reason, and because the cosmetic results are generally superior, we recommend breast conserving surgery.   We then discussed the rationale for systemic therapy. There is some risk that this cancer may have already spread to other parts of her body. Patients frequently ask at this point about bone scans, CAT scans and PET scans to find out if they have occult breast cancer somewhere else. The problem is that in early stage disease we are much more likely to find false positives then true cancers and this would expose the patient to unnecessary procedures as well as unnecessary radiation. Scans cannot answer the question the patient really would like to know, which is whether she has microscopic disease elsewhere in her body. For those reasons we do not recommend them.  Of course we would proceed to aggressive evaluation of any symptoms that might suggest metastatic disease, but that is not the case here.  Next we went over the options for systemic therapy which are anti-estrogens, anti-HER-2 immunotherapy, and chemotherapy. Kambrea does not meet criteria for anti-HER-2 immunotherapy. She is a good candidate for anti-estrogens.  The question of chemotherapy is more complicated. Chemotherapy is most effective in rapidly growing, aggressive  tumors. It is much less effective in slow growing cancers, like Brinly 's. For that reason we are going to request a MammaPrint from the definitive surgical sample, as suggested by NCCN guidelines. That will help [place] make a definitive decision regarding chemotherapy in this case.  Wessie also qualifies for genetics testing. In patients who carry a deleterious mutation [for example in a  BRCA gene], the risk of a new breast cancer developing in the future may be sufficiently great that the patient may choose bilateral mastectomies. However if she wishes to keep her breasts in that situation it is safe to do so. That would require intensified screening, which generally means not only yearly mammography but a yearly breast MRI as well.  Galit has a good understanding of the overall plan. She agrees with it. She knows the goal of treatment in her case is cure.  She will call with any problems that may develop before her next visit here.  Total encounter time 65 minutes.Sarajane Jews C. Malaysha Arlen, MD 06/16/2020 11:28 AM Medical Oncology and Hematology [facility] [place], [place] [address] Tel. [phone]    Fax. [phone]   This document serves as a record of services personally performed by Lurline Del, MD. It was created on his behalf by Wilburn Mylar, a trained medical scribe. The creation of this record is based on the scribe's personal observations and the provider's statements to them.   I, Lurline Del MD, have reviewed the above documentation for accuracy and completeness, and I agree with the above.    *Total Encounter Time as defined by the Centers for Medicare and Medicaid Services includes, in addition to the face-to-face time of a patient visit (documented in the note above) non-face-to-face time: obtaining and reviewing outside history, ordering and reviewing medications, tests or procedures, care coordination (communications with other  health care professionals or caregivers) and documentation in the medical record.

## 2020-06-16 ENCOUNTER — Telehealth: Payer: Self-pay | Admitting: *Deleted

## 2020-06-16 ENCOUNTER — Inpatient Hospital Stay: Payer: BC Managed Care – PPO

## 2020-06-16 ENCOUNTER — Encounter: Payer: Self-pay | Admitting: *Deleted

## 2020-06-16 ENCOUNTER — Inpatient Hospital Stay: Payer: BC Managed Care – PPO | Attending: Oncology | Admitting: Oncology

## 2020-06-16 ENCOUNTER — Encounter: Payer: Self-pay | Admitting: Genetic Counselor

## 2020-06-16 ENCOUNTER — Encounter: Payer: Self-pay | Admitting: Physical Therapy

## 2020-06-16 ENCOUNTER — Ambulatory Visit
Admission: RE | Admit: 2020-06-16 | Discharge: 2020-06-16 | Disposition: A | Discharge status: Home / Self Care | Payer: BC Managed Care – PPO | Source: Ambulatory Visit | Attending: Radiation Oncology | Admitting: Radiation Oncology

## 2020-06-16 ENCOUNTER — Ambulatory Visit (HOSPITAL_BASED_OUTPATIENT_CLINIC_OR_DEPARTMENT_OTHER): Payer: BC Managed Care – PPO | Admitting: Genetic Counselor

## 2020-06-16 ENCOUNTER — Ambulatory Visit: Payer: Self-pay | Admitting: Surgery

## 2020-06-16 ENCOUNTER — Other Ambulatory Visit: Payer: Self-pay

## 2020-06-16 ENCOUNTER — Ambulatory Visit: Payer: BC Managed Care – PPO | Attending: Surgery | Admitting: Physical Therapy

## 2020-06-16 ENCOUNTER — Inpatient Hospital Stay: Payer: BC Managed Care – PPO | Admitting: Licensed Clinical Social Worker

## 2020-06-16 ENCOUNTER — Encounter: Payer: Self-pay | Admitting: Radiation Oncology

## 2020-06-16 VITALS — BP 115/76 | HR 73 | Temp 98.2°F | Resp 19 | Ht 65.5 in | Wt 158.5 lb

## 2020-06-16 DIAGNOSIS — C50411 Malignant neoplasm of upper-outer quadrant of right female breast: Secondary | ICD-10-CM

## 2020-06-16 DIAGNOSIS — R293 Abnormal posture: Secondary | ICD-10-CM | POA: Diagnosis present

## 2020-06-16 DIAGNOSIS — Z8049 Family history of malignant neoplasm of other genital organs: Secondary | ICD-10-CM

## 2020-06-16 DIAGNOSIS — Z170 Estrogen receptor positive status [ER+]: Secondary | ICD-10-CM

## 2020-06-16 HISTORY — DX: Family history of malignant neoplasm of other genital organs: Z80.49

## 2020-06-16 LAB — CBC WITH DIFFERENTIAL (CANCER CENTER ONLY)
Abs Immature Granulocytes: 0.02 10*3/uL (ref 0.00–0.07)
Basophils Absolute: 0 10*3/uL (ref 0.0–0.1)
Basophils Relative: 0 %
Eosinophils Absolute: 0.1 10*3/uL (ref 0.0–0.5)
Eosinophils Relative: 1 %
HCT: 39.5 % (ref 36.0–46.0)
Hemoglobin: 13.5 g/dL (ref 12.0–15.0)
Immature Granulocytes: 0 %
Lymphocytes Relative: 24 %
Lymphs Abs: 1.5 10*3/uL (ref 0.7–4.0)
MCH: 33 pg (ref 26.0–34.0)
MCHC: 34.2 g/dL (ref 30.0–36.0)
MCV: 96.6 fL (ref 80.0–100.0)
Monocytes Absolute: 0.3 10*3/uL (ref 0.1–1.0)
Monocytes Relative: 6 %
Neutro Abs: 4.1 10*3/uL (ref 1.7–7.7)
Neutrophils Relative %: 69 %
Platelet Count: 263 10*3/uL (ref 150–400)
RBC: 4.09 MIL/uL (ref 3.87–5.11)
RDW: 11.9 % (ref 11.5–15.5)
WBC Count: 6 10*3/uL (ref 4.0–10.5)
nRBC: 0 % (ref 0.0–0.2)

## 2020-06-16 LAB — CMP (CANCER CENTER ONLY)
ALT: 15 U/L (ref 0–44)
AST: 18 U/L (ref 15–41)
Albumin: 4.4 g/dL (ref 3.5–5.0)
Alkaline Phosphatase: 64 U/L (ref 38–126)
Anion gap: 6 (ref 5–15)
BUN: 15 mg/dL (ref 6–20)
CO2: 30 mmol/L (ref 22–32)
Calcium: 9.5 mg/dL (ref 8.9–10.3)
Chloride: 104 mmol/L (ref 98–111)
Creatinine: 0.83 mg/dL (ref 0.44–1.00)
GFR, Estimated: >60 mL/min (ref >60)
Glucose, Bld: 123 mg/dL — ABNORMAL HIGH (ref 70–99)
Potassium: 4.1 mmol/L (ref 3.5–5.1)
Sodium: 140 mmol/L (ref 135–145)
Total Bilirubin: 0.4 mg/dL (ref 0.3–1.2)
Total Protein: 7.6 g/dL (ref 6.5–8.1)

## 2020-06-16 LAB — GENETIC SCREENING ORDER

## 2020-06-16 NOTE — H&P (Addendum)
History of Present Illness Autumn Bullock. Autumn Ashenfelter MD; 06/16/2020 11:46 AM) The patient is a 44 year old female who presents with breast cancer. [place] - 06/16/20 Autumn Bullock  This is a healthy 44 year old female who presents after routine screening mammogram revealed two suspicious areas in the right upper outer quadrant. She underwent further evaluation with mammogram and [place] that revealed a mass at 11:30 in the right breast 2.3 x 1.5 x 1.7 cm 3 cmfn. Just superficial to this area, there is a second 0.8 x 0.6 x 0.7 cm mass. 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 She has a large hematoma from the biopsy.  No family history of breast cancer.  CLINICAL DATA: Patient returns after screening for evaluation possible RIGHT breast mass.  EXAM: DIGITAL DIAGNOSTIC RIGHT MAMMOGRAM WITH CAD AND TOMO  ULTRASOUND RIGHT BREAST  COMPARISON: Previous exam(s).  ACR Breast Density Category c: The breast tissue is heterogeneously dense, which may obscure small masses.  FINDINGS: Additional 2-D and 3-D images are performed. These views confirm presence of a spiculated mass in the UPPER-OUTER QUADRANT of the RIGHT breast. No definitive abnormality identified in the retroareolar region.  Mammographic images were processed with CAD.  On physical exam, I palpate focal soft thickening in the 11:30 location of the RIGHT breast. I palpate no discrete mass.  Targeted ultrasound is performed, showing irregular mass with indistinct margins in the 11:30 o'clock location of the RIGHT breast 3 centimeters from the nipple. Mass is 2.3 x 1.5 x 1.7 centimeters. No blood flow identified on Doppler evaluation.  Just superficial to this mass, also in the 11:30 o'clock location 3 centimeters from the nipple, there is a spiculated solid mass with internal blood flow measuring 0.8 x 0.6 x 0.7 centimeters.  In the 11:30 o'clock location of the RIGHT breast 4 centimeters from the  nipple, there is an anechoic mass with internal septations measuring 0.8 x 0.5 x 0.8 centimeters.  In the 12 o'clock location 3 centimeters from the nipple, there is a simple cyst measuring 0.6 centimeters.  Evaluation of the RIGHT axilla shows no adenopathy.  IMPRESSION: 1. Suspicious 2.3 centimeter mass in the 11:30 o'clock location of the RIGHT breast warranting tissue diagnosis. 2. Suspicious 0.8 centimeter spiculated mass superficial to the larger mass also warranting tissue diagnosis. 3. No RIGHT axillary adenopathy.  RECOMMENDATION: 1. Recommend ultrasound-guided core biopsy of mass in the 11:30 o'clock location of the RIGHT breast. 2. Recommend ultrasound-guided core biopsy of more superficial mass in the 11:30 o'clock location of the RIGHT breast. 3. Biopsies will be performed later today.  I have discussed the findings and recommendations with the patient. If applicable, a reminder letter will be sent to the patient regarding the next appointment.  BI-RADS CATEGORY 4: Suspicious.   Electronically Signed By: Autumn Bullock M.D. On: 06/08/2020 12:20   CLINICAL DATA: Patient with 2 adjacent suspicious masses within the outer RIGHT breast, both at the 11:30 o'clock axis, presents for ultrasound-guided core biopsies of each.  EXAM: ULTRASOUND GUIDED RIGHT BREAST CORE NEEDLE BIOPSY x2  COMPARISON: Previous exam(s).  PROCEDURE: I met with the patient and we discussed the procedure of ultrasound-guided biopsy, including benefits and alternatives. We discussed the high likelihood of a successful procedure. We discussed the risks of the procedure, including infection, bleeding, tissue injury, clip migration, and inadequate sampling. Informed written consent was given. The usual time-out protocol was performed immediately prior to the procedure.  Site 1: Lesion  quadrant: Upper outer quadrant  Using sterile technique and 1% Lidocaine as local anesthetic,  under direct ultrasound visualization, a 12 gauge spring-loaded device was used to perform biopsy of the smaller more superficial RIGHT breast mass at the 11:30 o'clock axis using a lateral approach. At the conclusion of the procedure ribbon shaped tissue marker clip was deployed into the biopsy cavity.  Site 2: Lesion quadrant: Upper outer quadrant  Using sterile technique and 1% Lidocaine as local anesthetic, under direct ultrasound visualization, a 12 gauge spring-loaded device was used to perform biopsy of the larger deeper mass in the RIGHT breast at the 11:30 o'clock using a lateral approach. At the conclusion of the procedure coil shaped tissue marker clip was deployed into the biopsy cavity.  Follow up 2 view mammogram was performed and dictated separately.  IMPRESSION: 1. Ultrasound guided biopsy of the smaller more superficial mass in the RIGHT breast at the 11:30 o'clock axis (ribbon clip). No apparent complications. 2. Ultrasound guided biopsy of the larger deeper mass in the RIGHT breast at the 11:30 o'clock axis (coil clip). No apparent complications.  Electronically Signed: By: Autumn Bullock M.D. On: 06/08/2020 15:49    Past Surgical History Autumn Slipper, RN; 06/16/2020 8:12 AM) Cesarean Section - 1  Diagnostic Studies History Autumn Slipper, RN; 06/16/2020 8:12 AM) Colonoscopy >10 years ago Mammogram within last year Pap Smear 1-5 years ago  Allergies Autumn Key K. Izeah Vossler, MD; 06/16/2020 11:46 AM) Adhesive Tape Rash. redness, sensitivity to adhesives  Medication History Autumn Bullock. Autumn Germany, MD; 06/16/2020 11:46 AM) Medications Reconciled No Current Medications  Social History Autumn Slipper, RN; 06/16/2020 8:12 AM) Alcohol use Moderate alcohol use. Caffeine use Coffee. Illicit drug use Remotely quit drug use. Tobacco use Former smoker.  Family History Autumn Slipper, RN; 06/16/2020 8:12 AM) Alcohol Abuse Brother, Sister. Diabetes Mellitus  Family Members In General. Heart Disease Family Members In General. Hypertension Father, Sister. Migraine Headache Mother, Sister.  Pregnancy / Birth History Autumn Slipper, RN; 06/16/2020 8:12 AM) Age at menarche 62 years. Gravida 1 Length (months) of breastfeeding 3-6 Maternal age 56-35 Para 1 Regular periods  Other Problems Autumn Slipper, RN; 06/16/2020 8:12 AM) Back Pain Hemorrhoids     Review of Systems Autumn Slipper RN; 06/16/2020 8:12 AM) General Not Present- Appetite Loss, Chills, Fatigue, Fever, Night Sweats, Weight Gain and Weight Loss. Skin Not Present- Change in Wart/Mole, Dryness, Hives, Jaundice, New Lesions, Non-Healing Wounds, Rash and Ulcer. HEENT Present- Seasonal Allergies. Not Present- Earache, Hearing Loss, Hoarseness, Nose Bleed, Oral Ulcers, Ringing in the Ears, Sinus Pain, Sore Throat, Visual Disturbances, Wears glasses/contact lenses and Yellow Eyes. Respiratory Not Present- Bloody sputum, Chronic Cough, Difficulty Breathing, Snoring and Wheezing. Breast Not Present- Breast Mass, Breast Pain, Nipple Discharge and Skin Changes. Cardiovascular Not Present- Chest Pain, Difficulty Breathing Lying Down, Leg Cramps, Palpitations, Rapid Heart Rate, Shortness of Breath and Swelling of Extremities. Gastrointestinal Present- Hemorrhoids. Not Present- Abdominal Pain, Bloating, Bloody Stool, Change in Bowel Habits, Chronic diarrhea, Constipation, Difficulty Swallowing, Excessive gas, Gets full quickly at meals, Indigestion, Nausea, Rectal Pain and Vomiting. Female Genitourinary Not Present- Frequency, Nocturia, Painful Urination, Pelvic Pain and Urgency. Musculoskeletal Not Present- Back Pain, Joint Pain, Joint Stiffness, Muscle Pain, Muscle Weakness and Swelling of Extremities. Neurological Not Present- Decreased Memory, Fainting, Headaches, Numbness, Seizures, Tingling, Tremor, Trouble walking and Weakness. Psychiatric Not Present- Anxiety, Bipolar, Change in  Sleep Pattern, Depression, Fearful and Frequent crying. Endocrine Not Present- Cold Intolerance, Excessive Hunger, Hair Changes, Heat Intolerance, Hot flashes and New Diabetes. Hematology  Present- Easy Bruising. Not Present- Blood Thinners, Excessive bleeding, Gland problems, HIV and Persistent Infections.   Physical Exam Autumn Key K. Chaya Dehaan MD; 06/16/2020 11:47 AM)  The physical exam findings are as follows: Note:Constitutional: WDWN in NAD, conversant, no obvious deformities; resting comfortably Eyes: Pupils equal, round; sclera anicteric; moist conjunctiva; no lid lag HENT: Oral mucosa moist; good dentition Neck: No masses palpated, trachea midline; no thyromegaly Lungs: CTA bilaterally; normal respiratory effort Breasts: symmetric except for ecchymosis in the RUOQ from the biopsy; no nipple changes; no axillary lymphadenopathy; bilateral fibrocystic changes RUOQ at 11:00 deep palpable firmness underneath the ecchymosis CV: Regular rate and rhythm; no murmurs; extremities well-perfused with no edema Abd: +bowel sounds, soft, non-tender, no palpable organomegaly; no palpable hernias Musc: Normal gait; no apparent clubbing or cyanosis in extremities Lymphatic: No palpable cervical or axillary lymphadenopathy Skin: Warm, dry; no sign of jaundice Psychiatric - alert and oriented x 4; calm mood and affect    Assessment & Plan Autumn Key K. 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 Genetics and MRI may push the decision towards mastectomy w/ or w/o reconstruction. We spent about 45 minutes discussing her  disease and the surgical options. We will talk further after the MRI and Genetic testing is complete.  Autumn Bullock. Georgette Dover, MD, [facility]  General/ Trauma Surgery   06/16/2020 11:50 AM

## 2020-06-16 NOTE — H&P (View-Only) (Signed)
History of Present Illness (Gleb Mcguire K. Joann Kulpa MD; 06/16/2020 11:46 AM) The patient is a 44 year old female who presents with breast cancer. [facility] - 06/16/20 Magrinat Squire  This is a healthy 44 year old female who presents after routine screening mammogram revealed two suspicious areas in the right upper outer quadrant. She underwent further evaluation with mammogram and US that revealed a mass at 11:30 in the right breast 2.3 x 1.5 x 1.7 cm 3 cmfn. Just superficial to this area, there is a second 0.8 x 0.6 x 0.7 cm mass. 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 She has a large hematoma from the biopsy.  No family history of breast cancer.  CLINICAL DATA: Patient returns after screening for evaluation possible RIGHT breast mass.  EXAM: DIGITAL DIAGNOSTIC RIGHT MAMMOGRAM WITH CAD AND TOMO  ULTRASOUND RIGHT BREAST  COMPARISON: Previous exam(s).  ACR Breast Density Category c: The breast tissue is heterogeneously dense, which may obscure small masses.  FINDINGS: Additional 2-D and 3-D images are performed. These views confirm presence of a spiculated mass in the UPPER-OUTER QUADRANT of the RIGHT breast. No definitive abnormality identified in the retroareolar region.  Mammographic images were processed with CAD.  On physical exam, I palpate focal soft thickening in the 11:30 location of the RIGHT breast. I palpate no discrete mass.  Targeted ultrasound is performed, showing irregular mass with indistinct margins in the 11:30 o'clock location of the RIGHT breast 3 centimeters from the nipple. Mass is 2.3 x 1.5 x 1.7 centimeters. No blood flow identified on Doppler evaluation.  Just superficial to this mass, also in the 11:30 o'clock location 3 centimeters from the nipple, there is a spiculated solid mass with internal blood flow measuring 0.8 x 0.6 x 0.7 centimeters.  In the 11:30 o'clock location of the RIGHT breast 4 centimeters from the  nipple, there is an anechoic mass with internal septations measuring 0.8 x 0.5 x 0.8 centimeters.  In the 12 o'clock location 3 centimeters from the nipple, there is a simple cyst measuring 0.6 centimeters.  Evaluation of the RIGHT axilla shows no adenopathy.  IMPRESSION: 1. Suspicious 2.3 centimeter mass in the 11:30 o'clock location of the RIGHT breast warranting tissue diagnosis. 2. Suspicious 0.8 centimeter spiculated mass superficial to the larger mass also warranting tissue diagnosis. 3. No RIGHT axillary adenopathy.  RECOMMENDATION: 1. Recommend ultrasound-guided core biopsy of mass in the 11:30 o'clock location of the RIGHT breast. 2. Recommend ultrasound-guided core biopsy of more superficial mass in the 11:30 o'clock location of the RIGHT breast. 3. Biopsies will be performed later today.  I have discussed the findings and recommendations with the patient. If applicable, a reminder letter will be sent to the patient regarding the next appointment.  BI-RADS CATEGORY 4: Suspicious.   Electronically Signed By: Elizabeth Brown M.D. On: 06/08/2020 12:20   CLINICAL DATA: Patient with 2 adjacent suspicious masses within the outer RIGHT breast, both at the 11:30 o'clock axis, presents for ultrasound-guided core biopsies of each.  EXAM: ULTRASOUND GUIDED RIGHT BREAST CORE NEEDLE BIOPSY x2  COMPARISON: Previous exam(s).  PROCEDURE: I met with the patient and we discussed the procedure of ultrasound-guided biopsy, including benefits and alternatives. We discussed the high likelihood of a successful procedure. We discussed the risks of the procedure, including infection, bleeding, tissue injury, clip migration, and inadequate sampling. Informed written consent was given. The usual time-out protocol was performed immediately prior to the procedure.  Site 1: Lesion   quadrant: Upper outer quadrant  Using sterile technique and 1% Lidocaine as local anesthetic,  under direct ultrasound visualization, a 12 gauge spring-loaded device was used to perform biopsy of the smaller more superficial RIGHT breast mass at the 11:30 o'clock axis using a lateral approach. At the conclusion of the procedure ribbon shaped tissue marker clip was deployed into the biopsy cavity.  Site 2: Lesion quadrant: Upper outer quadrant  Using sterile technique and 1% Lidocaine as local anesthetic, under direct ultrasound visualization, a 12 gauge spring-loaded device was used to perform biopsy of the larger deeper mass in the RIGHT breast at the 11:30 o'clock using a lateral approach. At the conclusion of the procedure coil shaped tissue marker clip was deployed into the biopsy cavity.  Follow up 2 view mammogram was performed and dictated separately.  IMPRESSION: 1. Ultrasound guided biopsy of the smaller more superficial mass in the RIGHT breast at the 11:30 o'clock axis (ribbon clip). No apparent complications. 2. Ultrasound guided biopsy of the larger deeper mass in the RIGHT breast at the 11:30 o'clock axis (coil clip). No apparent complications.  Electronically Signed: By: Stan Maynard M.D. On: 06/08/2020 15:49    Past Surgical History (Wendy Smith, RN; 06/16/2020 8:12 AM) Cesarean Section - 1  Diagnostic Studies History (Wendy Smith, RN; 06/16/2020 8:12 AM) Colonoscopy >10 years ago Mammogram within last year Pap Smear 1-5 years ago  Allergies (Kasyn Stouffer K. Clemence Stillings, MD; 06/16/2020 11:46 AM) Adhesive Tape Rash. redness, sensitivity to adhesives  Medication History (Yancarlos Berthold K. Adalina Dopson, MD; 06/16/2020 11:46 AM) Medications Reconciled No Current Medications  Social History (Wendy Smith, RN; 06/16/2020 8:12 AM) Alcohol use Moderate alcohol use. Caffeine use Coffee. Illicit drug use Remotely quit drug use. Tobacco use Former smoker.  Family History (Wendy Smith, RN; 06/16/2020 8:12 AM) Alcohol Abuse Brother, Sister. Diabetes Mellitus  Family Members In General. Heart Disease Family Members In General. Hypertension Father, Sister. Migraine Headache Mother, Sister.  Pregnancy / Birth History (Wendy Smith, RN; 06/16/2020 8:12 AM) Age at menarche 16 years. Gravida 1 Length (months) of breastfeeding 3-6 Maternal age 31-35 Para 1 Regular periods  Other Problems (Wendy Smith, RN; 06/16/2020 8:12 AM) Back Pain Hemorrhoids     Review of Systems (Wendy Smith RN; 06/16/2020 8:12 AM) General Not Present- Appetite Loss, Chills, Fatigue, Fever, Night Sweats, Weight Gain and Weight Loss. Skin Not Present- Change in Wart/Mole, Dryness, Hives, Jaundice, New Lesions, Non-Healing Wounds, Rash and Ulcer. HEENT Present- Seasonal Allergies. Not Present- Earache, Hearing Loss, Hoarseness, Nose Bleed, Oral Ulcers, Ringing in the Ears, Sinus Pain, Sore Throat, Visual Disturbances, Wears glasses/contact lenses and Yellow Eyes. Respiratory Not Present- Bloody sputum, Chronic Cough, Difficulty Breathing, Snoring and Wheezing. Breast Not Present- Breast Mass, Breast Pain, Nipple Discharge and Skin Changes. Cardiovascular Not Present- Chest Pain, Difficulty Breathing Lying Down, Leg Cramps, Palpitations, Rapid Heart Rate, Shortness of Breath and Swelling of Extremities. Gastrointestinal Present- Hemorrhoids. Not Present- Abdominal Pain, Bloating, Bloody Stool, Change in Bowel Habits, Chronic diarrhea, Constipation, Difficulty Swallowing, Excessive gas, Gets full quickly at meals, Indigestion, Nausea, Rectal Pain and Vomiting. Female Genitourinary Not Present- Frequency, Nocturia, Painful Urination, Pelvic Pain and Urgency. Musculoskeletal Not Present- Back Pain, Joint Pain, Joint Stiffness, Muscle Pain, Muscle Weakness and Swelling of Extremities. Neurological Not Present- Decreased Memory, Fainting, Headaches, Numbness, Seizures, Tingling, Tremor, Trouble walking and Weakness. Psychiatric Not Present- Anxiety, Bipolar, Change in  Sleep Pattern, Depression, Fearful and Frequent crying. Endocrine Not Present- Cold Intolerance, Excessive Hunger, Hair Changes, Heat Intolerance, Hot flashes and New Diabetes. Hematology   Present- Easy Bruising. Not Present- Blood Thinners, Excessive bleeding, Gland problems, HIV and Persistent Infections.   Physical Exam (Orian Amberg K. Lismary Kiehn MD; 06/16/2020 11:47 AM)  The physical exam findings are as follows: Note:Constitutional: WDWN in NAD, conversant, no obvious deformities; resting comfortably Eyes: Pupils equal, round; sclera anicteric; moist conjunctiva; no lid lag HENT: Oral mucosa moist; good dentition Neck: No masses palpated, trachea midline; no thyromegaly Lungs: CTA bilaterally; normal respiratory effort Breasts: symmetric except for ecchymosis in the RUOQ from the biopsy; no nipple changes; no axillary lymphadenopathy; bilateral fibrocystic changes RUOQ at 11:00 deep palpable firmness underneath the ecchymosis CV: Regular rate and rhythm; no murmurs; extremities well-perfused with no edema Abd: +bowel sounds, soft, non-tender, no palpable organomegaly; no palpable hernias Musc: Normal gait; no apparent clubbing or cyanosis in extremities Lymphatic: No palpable cervical or axillary lymphadenopathy Skin: Warm, dry; no sign of jaundice Psychiatric - alert and oriented x 4; calm mood and affect    Assessment & Plan (Rajeev Escue K. 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 Genetics and MRI may push the decision towards mastectomy w/ or w/o reconstruction. We spent about 45 minutes discussing her  disease and the surgical options. We will talk further after the MRI and Genetic testing is complete.  Kweku Stankey K. Arihant Pennings, MD, FACS [facility]  General/ Trauma Surgery   06/16/2020 11:50 AM  

## 2020-06-16 NOTE — Telephone Encounter (Signed)
Received order for mammaprint testing on core bx per Dr. Jana Hakim. Requisition faxed to [facility] and [facility].

## 2020-06-16 NOTE — Patient Instructions (Signed)

## 2020-06-16 NOTE — Progress Notes (Signed)
[place] Work  Initial Assessment   Autumn Bullock is a 44 y.o. year old female accompanied by patient and husband, [name]. Clinical Social Work was referred by [facility] for assessment of psychosocial needs.   SDOH (Social Determinants of Health) assessments performed: Yes SDOH Interventions     Most Recent Value  SDOH Interventions  Food Insecurity Interventions Intervention Not Indicated  Financial Strain Interventions Intervention Not Indicated  Housing Interventions Intervention Not Indicated  Transportation Interventions Intervention Not Indicated      Distress Screen completed: Yes:   ONCBCN DISTRESS SCREENING 06/16/2020  Screening Type Initial Screening  Distress experienced in past week (1-10) 8  Emotional problem type Nervousness/Anxiety  Information Concerns Type Lack of info about diagnosis;Lack of info about treatment    Family/Social Information:  . Housing Arrangement: patient lives with husband and 10yo daughter. Parents staying in in-law suite to help during treatment . Family members/support persons in your life? Husband, parents, close friends . Transportation concerns: no  . Employment: Working full timePsychologist, sport and exercise with [employer] (has leave time available) Income source: Employment . Financial concerns: No o Type of concern: None . Food access concerns: no . Religious or spiritual practice: not addressed . Services Currently in place:  Leave available through work  Coping/ Adjustment to diagnosis: . Patient understands treatment plan and what happens next? yes, still waiting on final plan after more imaging, but knows she will have surgery and radiation . Concerns about diagnosis and/or treatment: I'm not especially worried about anything . Patient reported stressors: Nervous around diagnosis and treatment. More information today was helpful . Patient enjoys time with family/ friends and watching football, relaxing on  weekends . Current coping skills/ strengths: Active sense of humor, Communication skills, Financial means, Motivation for treatment/growth and Supportive family/friends    SUMMARY: Current SDOH Barriers:  . None noted today  Clinical Social Work Clinical Goal(s):  Marland Kitchen Patient will continue to attend all scheduled medical appointments  Interventions: . Discussed common feeling and emotions when being diagnosed with cancer, and the importance of support during treatment . Informed patient of the support team roles and support services at [facility] . Provided CSW contact information and encouraged patient to call with any questions or concerns   Follow Up Plan: Patient will contact this CSW with any support or resource needs Patient verbalizes understanding of plan: Yes    Edwinna Areola Sharone Almond LCSW

## 2020-06-16 NOTE — Therapy (Signed)
[place], [place], [address] Phone: [phone]   Fax:  [phone]  Physical Therapy Evaluation  Patient Details  Name: Autumn Bullock MRN: [id number] Date of Birth: [birth year] Referring Provider (PT): Dr. Donnie Mesa   Encounter Date: 06/16/2020   PT End of Session - 06/16/20 1345    Visit Number 1    Number of Visits 2    Date for PT Re-Evaluation 08/11/20    PT Start Time 8938    PT Stop Time 1004   Also saw pt from 1053-1116 for a total of 33 minutes   PT Time Calculation (min) 10 min    Activity Tolerance Patient tolerated treatment well    Behavior During Therapy [facility] for tasks assessed/performed           Past Medical History:  Diagnosis Date  . Family history of uterine cancer 06/16/2020    Past Surgical History:  Procedure Laterality Date  . CESAREAN SECTION     2011    There were no vitals filed for this visit.    Subjective Assessment - 06/16/20 1329    Subjective Patient reports she is here today to be seen by her medical team for her newly diagnosed right breast cancer.    Patient is accompained by: Family member    Pertinent History Patient was diagnosed on 06/02/2020 with right grade II invasive ductal carcinoma breast cancer. There are 2 masses that measure 8 mm and 2.3 cm both in the upper outer quadrant. It is ER/PR positive and HER2 negative with a Ki67 of 5%.    Patient Stated Goals Reduce lymphedema risk and learn post op shoulder ROM HEP    Currently in Pain? No/denies              [facility] PT Assessment - 06/16/20 0001      Assessment   Medical Diagnosis Right breast cancer    Referring Provider (PT) Dr. Donnie Mesa    Onset Date/Surgical Date 06/02/20    Hand Dominance Right    Prior Therapy none      Precautions   Precautions Other (comment)    Precaution Comments Active cancer      Restrictions   Weight Bearing Restrictions No       Balance Screen   Has the patient fallen in the past 6 months No    Has the patient had a decrease in activity level because of a fear of falling?  No    Is the patient reluctant to leave their home because of a fear of falling?  No      Home Environment   Living Environment Private residence    Living Arrangements Spouse/significant other;Children   Husband and 42 y.o. 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    Leisure She does boot camp 2-3x/week      Cognition   Overall Cognitive Status Within Functional Limits for tasks assessed      Posture/Postural Control   Posture/Postural Control Postural limitations    Postural Limitations Rounded Shoulders;Forward head      ROM / Strength   AROM / PROM / Strength AROM;Strength      AROM   Overall AROM Comments Cervical AROM is WNL    AROM Assessment  Site Shoulder    Right/Left Shoulder Right;Left    Right Shoulder Extension 47 Degrees    Right Shoulder Flexion 157 Degrees    Right Shoulder ABduction 153 Degrees    Right Shoulder Internal Rotation 53 Degrees   Painful   Right Shoulder External Rotation 90 Degrees   Painful   Left Shoulder Extension 44 Degrees    Left Shoulder Flexion 154 Degrees    Left Shoulder ABduction 167 Degrees    Left Shoulder Internal Rotation 69 Degrees    Left Shoulder External Rotation 88 Degrees      Strength   Overall Strength Within functional limits for tasks performed             LYMPHEDEMA/ONCOLOGY QUESTIONNAIRE - 06/16/20 0001      Type   Cancer Type Right breast cancer      Lymphedema Assessments   Lymphedema Assessments Upper extremities      Right Upper Extremity Lymphedema   10 cm Proximal to Olecranon Process 30.3 cm    Olecranon Process 25.4 cm    10 cm Proximal to Ulnar Styloid  Process 20.4 cm    Just Proximal to Ulnar Styloid Process 14.3 cm    Across Hand at [employer] 18.8 cm    At [place] of 2nd Digit 5.9 cm      Left Upper Extremity Lymphedema   10 cm Proximal to Olecranon Process 30.3 cm    Olecranon Process 25.3 cm    10 cm Proximal to Ulnar Styloid Process 19.6 cm    Just Proximal to Ulnar Styloid Process 13.9 cm    Across Hand at [employer] 18.4 cm    At [place] of 2nd Digit 5.8 cm           L-DEX FLOWSHEETS - 06/16/20 1300      L-DEX LYMPHEDEMA SCREENING   Measurement Type Unilateral    L-DEX MEASUREMENT EXTREMITY Upper Extremity    POSITION  Standing    DOMINANT SIDE Right    At Risk Side Right    BASELINE SCORE (UNILATERAL) -2.7           The patient was assessed using the L-Dex machine today to produce a lymphedema index baseline score. The patient will be reassessed on a regular basis (typically every 3 months) to obtain new L-Dex scores. If the score is > 6.5 points away from his/her baseline score indicating onset of subclinical lymphedema, it will be recommended to wear a compression garment for 4 weeks, 12 hours per day and then be reassessed. If the score continues to be > 6.5 points from baseline at reassessment, we will initiate lymphedema treatment. Assessing in this manner has a 95% rate of preventing clinically significant lymphedema.      Katina Dung - 06/16/20 0001    Open a tight or new jar No difficulty    Do heavy household chores (wash walls, wash floors) No difficulty    Carry a shopping bag or briefcase No difficulty    Wash your back No difficulty    Use a knife to cut food No difficulty    Recreational activities in which you take some force or impact through your arm, shoulder, or hand (golf, hammering, tennis) No difficulty    During the past week, to what extent has your arm, shoulder or hand problem interfered with your normal social activities with family, friends, neighbors, or groups? Not at all     During the past week, to what  extent has your arm, shoulder or hand problem limited your work or other regular daily activities Not at all    Arm, shoulder, or hand pain. None    Tingling (pins and needles) in your arm, shoulder, or hand None    Difficulty Sleeping No difficulty    DASH Score 0 %            Objective measurements completed on examination: See above findings.        Patient was instructed today in a home exercise program today for post op shoulder range of motion. These included active assist shoulder flexion in sitting, scapular retraction, wall walking with shoulder abduction, and hands behind head external rotation.  She was encouraged to do these twice a day, holding 3 seconds and repeating 5 times when permitted by her physician.           PT Education - 06/16/20 1344    Education Details Lymphedema risk reduction and post op shoulder ROM HEP    Person(s) Educated Patient;Spouse    Methods Explanation;Demonstration;Handout    Comprehension Returned demonstration;Verbalized understanding               PT Long Term Goals - 06/16/20 1349      PT LONG TERM GOAL #1   Title Patient will demonstrate she has regained full shoulder ROM and function post operatively compared to baselines.    Time 8    Period Weeks    Status New    Target Date 08/11/20           Breast Clinic Goals - 06/16/20 1348      Patient will be able to verbalize understanding of pertinent lymphedema risk reduction practices relevant to her diagnosis specifically related to skin care.   Time 1    Period Days    Status Achieved      Patient will be able to return demonstrate and/or verbalize understanding of the post-op home exercise program related to regaining shoulder range of motion.   Time 1    Period Days    Status Achieved      Patient will be able to verbalize understanding of the importance of attending the postoperative After Breast Cancer Class for further  lymphedema risk reduction education and therapeutic exercise.   Time 1    Period Days    Status Achieved                 Plan - 06/16/20 1346    Clinical Impression Statement Patient was diagnosed on 06/02/2020 with right grade II invasive ductal carcinoma breast cancer. There are 2 masses that measure 8 mm and 2.3 cm both in the upper outer quadrant. It is ER/PR positive and HER2 negative with a Ki67 of 5%. Her multidisciplinary medical team met prior to her assessments to determine a recommended treatment plan. She is planning to have a Mammaprint test performed on her core biopsy to determine the needs for chemotherapy, followed by a right double lumpectomy and sentinel node biopsy, radiation, and anti-estrogen therapy. She will benefit from a post op PT reassessment to determine needs and from L-Dex screens every 3 months for 2 years to detect subclinical lymphedema.    Stability/Clinical Decision Making Stable/Uncomplicated    Clinical Decision Making Low    Rehab Potential Excellent    PT Frequency --   Eval and 1 f/u visit   PT Treatment/Interventions ADLs/Self Care Home Management;Therapeutic exercise;Patient/family education    PT Next Visit Plan Will  reassess 3-4 weeks post op    PT Home Exercise Plan Post op shoulder ROM HEP    Consulted and Agree with Plan of Care Patient;Family member/caregiver    Family Member Consulted Husband           Patient will benefit from skilled therapeutic intervention in order to improve the following deficits and impairments:  Postural dysfunction, Decreased range of motion, Pain, Impaired UE functional use, Decreased knowledge of precautions  Visit Diagnosis: Malignant neoplasm of upper-outer quadrant of right breast in female, estrogen receptor positive ([place]) - Plan: PT plan of care cert/re-cert  Abnormal posture - Plan: PT plan of care cert/re-cert   Patient will follow up at outpatient cancer rehab 3-4 weeks following surgery.  If  the patient requires physical therapy at that time, a specific plan will be dictated and sent to the referring physician for approval. The patient was educated today on appropriate basic range of motion exercises to begin post operatively and the importance of attending the After Breast Cancer class following surgery.  Patient was educated today on lymphedema risk reduction practices as it pertains to recommendations that will benefit the patient immediately following surgery.  She verbalized good understanding.     Problem List Patient Active Problem List   Diagnosis Date Noted  . Family history of uterine cancer 06/16/2020  . Malignant neoplasm of upper-outer quadrant of right breast in female, estrogen receptor positive ([place]) 06/11/2020   Annia Friendly, PT 06/16/20 1:51 PM  [place], [place], [address] Phone: [phone]   Fax:  [phone]  Name: Kayonna Lawniczak MRN: [id number] Date of Birth: 1975/12/14

## 2020-06-16 NOTE — Progress Notes (Signed)
REFERRING PROVIDER: Chauncey Cruel, MD [address] [place],  [place] [address]  PRIMARY PROVIDER:  Brock Ra, PA-C  PRIMARY REASON FOR VISIT:  1. Malignant neoplasm of upper-outer quadrant of right breast in female, estrogen receptor positive ([place])   2. Family history of uterine cancer    I connected with Ms. Hakes on 06/16/2020 at 11:30am EDT by Webex video conference and verified that I am speaking with the correct person using two identifiers.   Patient location: [facility] Provider location: [place] OF PRESENT ILLNESS:   Ms. Autumn Bullock, a 44 y.o. female, was seen for a [place] cancer genetics consultation during breast multidisciplinary clinic at the request of Dr. Jana Hakim due to a personal history of breast cancer.  Ms. Kocourek presents to clinic today with her husband to discuss the possibility of a hereditary predisposition to cancer, genetic testing, and to further clarify her future cancer risks, as well as potential cancer risks for family members.   In October 2021, at the age of 9, Ms. Benney was diagnosed with invasive ductal carcinoma of the right breast. Hormone receptor status is ER+/PR+/HER2-. The preliminary treatment plan includes MRI, surgery, MammaPrint, adjuvant radiation, and anti-estrogens.   RISK FACTORS:  Menarche was at age 66.  First live birth at age 48.  OCP use for approximately 15 years.  Ovaries intact: yes.  Hysterectomy: no.  Menopausal status: premenopausal.  HRT use: 0 years. Colonoscopy: colonoscopy in 1999 per patient Mammogram within the last year: yes. Up to date with pelvic exams: yes.  Past Medical History:  Diagnosis Date  . Family history of uterine cancer 06/16/2020    Past Surgical History:  Procedure Laterality Date  . CESAREAN SECTION     2011    Social History   Socioeconomic History  . Marital status: Married    Spouse name: Not on file  . Number of children: Not on file  . Years of  education: Not on file  . Highest education level: Not on file  Occupational History  . Not on file  Tobacco Use  . Smoking status: Never Smoker  Substance and Sexual Activity  . Alcohol use: Yes    Comment: 5/week  . Drug use: Never  . Sexual activity: Not on file  Other Topics Concern  . Not on file  Social History Narrative  . Not on file   Social Determinants of Health   Financial Resource Strain: Low Risk   . Difficulty of Paying Living Expenses: Not hard at all  Food Insecurity: No Food Insecurity  . Worried About Charity fundraiser in the Last Year: Never true  . Ran Out of Food in the Last Year: Never true  Transportation Needs: No Transportation Needs  . Lack of Transportation (Medical): No  . Lack of Transportation (Non-Medical): No  Physical Activity:   . Days of Exercise per Week: Not on file  . Minutes of Exercise per Session: Not on file  Stress:   . Feeling of Stress : Not on file  Social Connections:   . Frequency of Communication with Friends and Family: Not on file  . Frequency of Social Gatherings with Friends and Family: Not on file  . Attends Religious Services: Not on file  . Active Member of Clubs or Organizations: Not on file  . Attends Archivist Meetings: Not on file  . Marital Status: Not on file     FAMILY HISTORY:  We obtained a detailed, 4-generation  family history.  Significant diagnoses are listed below: Family History  Problem Relation Age of Onset  . Cancer Maternal Grandmother 46       Unknown GYN.  ?Uterine    Ms. Meggison has a 65 year old daughter, [name].  Ms. Walthall mother is 34 years old and has not had cancer.  Ms. Preston maternal grandmother, who passed away after the age of 64, had an unknown gynecological cancer, likely uterine cancer, diagnosed at age 11.  Ms. Staron had limited information about other maternal family members. Ms. Dysert father is 47 years old.  No paternal family history of cancer was  reported.   Ms. Demo is unaware of previous family history of genetic testing for hereditary cancer risks. Patient's maternal ancestors are of [place] descent, and paternal ancestors are of Native [place] and White/Caucasian descent. There is no reported Ashkenazi Jewish ancestry. There is no known consanguinity.  GENETIC COUNSELING ASSESSMENT: Ms. Mcmurry is a 44 y.o. female with a personal history of breast cancer and family history of gynecological cancer which is somewhat suggestive of a hereditary cancer syndrome and predisposition to cancer given the ages of 76. We, therefore, discussed and recommended the following at today's visit.   DISCUSSION: We discussed that 5 - 10% of cancer is hereditary, with most cases of hereditary breast cancer associated with mutations in BRCA1 and BRCA2.  There are other genes that can be associated with hereditary breast and uterine cancer syndromes.  Type of cancer risk and level of risk are gene-specific.  We discussed that testing is beneficial for several reasons including knowing how to follow individuals after completing their treatment, identifying whether potential treatment options would be beneficial, and understanding if other family members could be at risk for cancer and allowing them to undergo genetic testing.   We reviewed the characteristics, features and inheritance patterns of hereditary cancer syndromes. We also discussed genetic testing, including the appropriate family members to test, the process of testing, insurance coverage and turn-around-time for results. We discussed the implications of a negative, positive and/or variant of uncertain significant result. In order to get genetic test results in a timely manner so that Ms. Coate can use these genetic test results for surgical decisions, we recommended Ms. Pollino pursue genetic testing for the STAT Breast Cancer Panel. The STAT Breast cancer panel offered by [facility] includes  sequencing and rearrangement analysis for the following 9 genes:  ATM, BRCA1, BRCA2, CDH1, CHEK2, PALB2, PTEN, STK11 and TP53.  Once complete, we recommend Ms. Duhamel pursue reflex genetic testing to a more comprehensive gene panel.   Ms. Dorval  was offered a common hereditary cancer panel (48 genes) and an expanded pan-cancer panel (85 genes). Ms. Paff was informed of the benefits and limitations of each panel, including that expanded pan-cancer panels contain several preliminary evidence genes that do not have clear management guidelines at this point in time.  We also discussed that as the number of genes included on a panel increases, the chances of variants of uncertain significance increases.  After considering the benefits and limitations of each gene panel, Ms. Mirando  elected to have a common hereditary cancer panel through [facility].  The Common Hereditary Cancers Panel offered by [facility] includes sequencing and/or deletion duplication testing of the following 48 genes: APC, ATM, AXIN2, BARD1, BMPR1A, BRCA1, BRCA2, BRIP1, CDH1, CDK4, CDKN2A (p14ARF), CDKN2A (p16INK4a), CHEK2, CTNNA1, DICER1, EPCAM (Deletion/duplication testing only), GREM1 (promoter region deletion/duplication testing only), KIT, MEN1, MLH1, MSH2, MSH3, MSH6, MUTYH, NBN,  NF1, NHTL1, PALB2, PDGFRA, PMS2, POLD1, POLE, PTEN, RAD50, RAD51C, RAD51D, RNF43, SDHB, SDHC, SDHD, SMAD4, SMARCA4. STK11, TP53, TSC1, TSC2, and VHL.  The following genes were evaluated for sequence changes only: SDHA and HOXB13 c.251G>A variant only.  Based on Ms. Robb's personal history of breast cancer before the age of 76, she meets medical criteria for genetic testing. Despite that she meets criteria, she may still have an out of pocket cost. We discussed that if her out of pocket cost for testing is over $100, the laboratory will call and confirm whether she wants to proceed with testing.  If the out of pocket cost of testing is less than $100 she will be  billed by the genetic testing laboratory.   PLAN: After considering the risks, benefits, and limitations, Ms. Haluska provided informed consent to pursue genetic testing and the blood sample was sent to [facility] for analysis of the STAT Breast Cancer + Common Hereditary Cancers Panels. Results should be available within approximately 1-2 weeks' time, at which point they will be disclosed by telephone to Ms. Mcreynolds, as will any additional recommendations warranted by these results. Ms. Tomczak will receive a summary of her genetic counseling visit and a copy of her results once available. This information will also be available in Epic.   Lastly, we encouraged Ms. Curley to remain in contact with cancer genetics annually so that we can continuously update the family history and inform her of any changes in cancer genetics and testing that may be of benefit for this family.   Ms. Kretz questions were answered to her satisfaction today. Our contact information was provided should additional questions or concerns arise. Thank you for the referral and allowing [place] to share in the care of your patient.   Alawna Graybeal M. Joette Catching, [place], [facility] Certified Film/video [email or web address] (P) [phone]  The patient was seen for a total of 20 minutes in telehealth genetic counseling.  This patient was discussed with Drs. Magrinat, Lindi Adie and/or Burr Medico who agrees with the above.   _______________________________________________________________________ For Office Staff:  Number of people involved in session: 1 Was an Intern/ student involved with case: no

## 2020-06-18 ENCOUNTER — Telehealth: Payer: Self-pay | Admitting: Oncology

## 2020-06-18 NOTE — Telephone Encounter (Signed)
Scheduled per 10/13 los. Called and spoke with pt, confirmed 12/21 appt

## 2020-06-22 ENCOUNTER — Other Ambulatory Visit: Payer: Self-pay

## 2020-06-22 ENCOUNTER — Ambulatory Visit (HOSPITAL_COMMUNITY)
Admission: RE | Admit: 2020-06-22 | Discharge: 2020-06-22 | Disposition: A | Discharge status: Home / Self Care | Payer: BC Managed Care – PPO | Source: Ambulatory Visit | Attending: Surgery | Admitting: Surgery

## 2020-06-22 DIAGNOSIS — C50411 Malignant neoplasm of upper-outer quadrant of right female breast: Secondary | ICD-10-CM | POA: Diagnosis present

## 2020-06-22 DIAGNOSIS — Z170 Estrogen receptor positive status [ER+]: Secondary | ICD-10-CM | POA: Diagnosis present

## 2020-06-22 MED ORDER — GADOBUTROL 1 MMOL/ML IV SOLN
7.0000 mL | Freq: Once | INTRAVENOUS | Status: AC | PRN
  Administered 2020-06-22: 7 mL via INTRAVENOUS

## 2020-06-23 ENCOUNTER — Encounter: Payer: Self-pay | Admitting: Genetic Counselor

## 2020-06-23 ENCOUNTER — Ambulatory Visit: Payer: Self-pay | Admitting: Genetic Counselor

## 2020-06-23 ENCOUNTER — Telehealth: Payer: Self-pay | Admitting: Genetic Counselor

## 2020-06-23 DIAGNOSIS — Z1379 Encounter for other screening for genetic and chromosomal anomalies: Secondary | ICD-10-CM | POA: Insufficient documentation

## 2020-06-23 DIAGNOSIS — C50411 Malignant neoplasm of upper-outer quadrant of right female breast: Secondary | ICD-10-CM

## 2020-06-23 DIAGNOSIS — Z8049 Family history of malignant neoplasm of other genital organs: Secondary | ICD-10-CM

## 2020-06-23 DIAGNOSIS — Z170 Estrogen receptor positive status [ER+]: Secondary | ICD-10-CM

## 2020-06-23 NOTE — Telephone Encounter (Signed)
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  Discussed that we do not know why she has breast cancer or why there is cancer in the family. It could be sporadic, due to a different gene that we are not testing, or maybe our current technology may not be able to pick something up.  It will be important for her to keep in contact with genetics to keep up with whether additional testing may be needed.

## 2020-06-23 NOTE — Progress Notes (Signed)
HPI:  Autumn Bullock was previously seen in the [place] clinic due to a personal history of breast cancer and concerns regarding a hereditary predisposition to cancer. Please refer to our prior cancer genetics clinic note for more information regarding our discussion, assessment and recommendations, at the time. Autumn Bullock recent genetic test results were disclosed to her, as were recommendations warranted by these results. These results and recommendations are discussed in more detail below.  CANCER HISTORY:  Oncology History  Malignant neoplasm of upper-outer quadrant of right breast in female, estrogen receptor positive ([place])  06/11/2020 Initial Diagnosis   Malignant neoplasm of upper-outer quadrant of right breast in female, estrogen receptor positive ([place])   06/22/2020 Genetic Testing   Negative genetic testing: no pathogenic variants detected in [facility] Common Hereditary Cancers Panel.  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  The report date is June 22, 2020.    The Common Hereditary Cancers Panel offered by [facility] includes sequencing and/or deletion duplication testing of the following 48 genes: APC, ATM, AXIN2, BARD1, BMPR1A, BRCA1, BRCA2, BRIP1, CDH1, CDK4, CDKN2A (p14ARF), CDKN2A (p16INK4a), CHEK2, CTNNA1, DICER1, EPCAM (Deletion/duplication testing only), GREM1 (promoter region deletion/duplication testing only), KIT, MEN1, MLH1, MSH2, MSH3, MSH6, MUTYH, NBN, NF1, NHTL1, PALB2, PDGFRA, PMS2, POLD1, POLE, PTEN, RAD50, RAD51C, RAD51D, RNF43, SDHB, SDHC, SDHD, SMAD4, SMARCA4. STK11, TP53, TSC1, TSC2, and VHL.  The following genes were evaluated for sequence changes only: SDHA and HOXB13 c.251G>A variant only.     FAMILY HISTORY:  We obtained a detailed, 4-generation family history.  Significant diagnoses are listed below: Family History  Problem Relation Age of Onset  . Cancer Maternal Grandmother 29       Unknown GYN.  ?Uterine     Autumn Bullock has a 13 year old daughter, [name].  Autumn Bullock mother is 17 years old and has not had cancer.  Autumn Bullock maternal grandmother, who passed away after the age of 3, had an unknown gynecological cancer, likely uterine cancer, diagnosed at age 51.  Autumn Bullock had limited information about other maternal family members. Autumn Bullock father is 34 years old.  No paternal family history of cancer was reported.   Autumn Bullock is unaware of previous family history of genetic testing for hereditary cancer risks. Patient's maternal ancestors are of [place] descent, and paternal ancestors are of Native [place] and White/Caucasian descent. There is no reported Ashkenazi Jewish ancestry. There is no known consanguinity.  GENETIC TEST RESULTS: Genetic testing reported out on June 22, 2020.  The Common Hereditary Cancers Panel through [facility] found no pathogenic mutations. The Common Hereditary Cancers Panel offered by [facility] includes sequencing and/or deletion duplication testing of the following 48 genes: APC, ATM, AXIN2, BARD1, BMPR1A, BRCA1, BRCA2, BRIP1, CDH1, CDK4, CDKN2A (p14ARF), CDKN2A (p16INK4a), CHEK2, CTNNA1, DICER1, EPCAM (Deletion/duplication testing only), GREM1 (promoter region deletion/duplication testing only), KIT, MEN1, MLH1, MSH2, MSH3, MSH6, MUTYH, NBN, NF1, NHTL1, PALB2, PDGFRA, PMS2, POLD1, POLE, PTEN, RAD50, RAD51C, RAD51D, RNF43, SDHB, SDHC, SDHD, SMAD4, SMARCA4. STK11, TP53, TSC1, TSC2, and VHL.  The following genes were evaluated for sequence changes only: SDHA and HOXB13 c.251G>A variant only.  The test report has been scanned into EPIC and is located under the Molecular Pathology section of the Results Review tab.  A portion of the result report is included below for reference.     We discussed with Autumn Bullock that because current genetic testing is not perfect, it is possible there may be a gene mutation in one  of these genes that current testing cannot detect,  but that chance is small.  We also discussed, that there could be another gene that has not yet been discovered, or that we have not yet tested, that is responsible for the cancer diagnoses in the family. It is also possible there is a hereditary cause for the cancer in the family that Autumn Bullock did not inherit and therefore was not identified in her testing.  Therefore, it is important to remain in touch with cancer genetics in the future so that we can continue to offer Autumn Bullock the most up to date genetic testing.   Genetic testing did identify a variant of uncertain significance (VUS) was identified in the MSH3 gene called c.2731T>G (p.Leu911Val).  At this time, it is unknown if this variant is associated with increased cancer risk or if this is a normal finding, but most variants such as this get reclassified to being inconsequential. It should not be used to make medical management decisions. With time, we suspect the lab will determine the significance of this variant, if any. If we do learn more about it, we will try to contact Autumn Bullock to discuss it further. However, it is important to stay in touch with [place] periodically and keep the address and phone number up to date.  ADDITIONAL GENETIC TESTING: We discussed with Autumn Bullock that there are other genes that are associated with increased cancer risk that can be analyzed. Should Autumn Bullock wish to pursue additional genetic testing, we are happy to discuss and coordinate this testing, at any time.    CANCER SCREENING RECOMMENDATIONS: Autumn Bullock test result is considered negative (normal).  This means that we have not identified a hereditary cause for her personal history of breast cancer at this time. Most cancers happen by chance and this negative test suggests that her cancer may fall into this category.    While reassuring, this does not definitively rule out a hereditary predisposition to cancer. It is still possible that there could be  genetic mutations that are undetectable by current technology. There could be genetic mutations in genes that have not been tested or identified to increase cancer risk.  Therefore, it is recommended she continue to follow the cancer management and screening guidelines provided by heroncology and primary healthcare provider.   An individual's cancer risk and medical management are not determined by genetic test results alone. Overall cancer risk assessment incorporates additional factors, including personal medical history, family history, and any available genetic information that may result in a personalized plan for cancer prevention and surveillance  RECOMMENDATIONS FOR FAMILY MEMBERS:  Individuals in this family might be at some increased risk of developing cancer, over the general population risk, simply due to the family history of cancer.  We recommended women in this family have a yearly mammogram beginning at age 69, or 43 years younger than the earliest onset of cancer, an annual clinical breast exam, and perform monthly breast self-exams. Women in this family should also have a gynecological exam as recommended by their primary provider. All family members should be referred for colonoscopy starting at age 68.  It is also possible there is a hereditary cause for the cancer in Ms. Bitterman's family that she did not inherit and therefore was not identified in her.  Based on Ms. Evers's family history of a maternal grandmother with a gynecological cancer before the age of 66, we recommended Ms. Bunter's mother have genetic counseling. Ms. Cordoba will  let us know if we can be of any assistance in coordinating genetic counseling and/or testing for this family member.   FOLLOW-UP: Lastly, we discussed with Ms. Shrewsbury that cancer genetics is a rapidly advancing field and it is possible that new genetic tests will be appropriate for her and/or her family members in the future. We encouraged her to remain  in contact with cancer genetics on an annual basis so we can update her personal and family histories and let her know of advances in cancer genetics that may benefit this family.   Our contact number was provided. Ms. Coombes questions were answered to her satisfaction, and she knows she is welcome to call us at anytime with additional questions or concerns.   Qamar Aughenbaugh M. Joette Catching, [place], [facility] Certified Film/video [email or web address] (P) [phone]

## 2020-06-24 ENCOUNTER — Telehealth: Payer: Self-pay | Admitting: *Deleted

## 2020-06-24 ENCOUNTER — Encounter: Payer: Self-pay | Admitting: *Deleted

## 2020-06-24 NOTE — Telephone Encounter (Signed)
Spoke with patient to follow up from [facility] and assess navigation needs.  Patient is anxious to move forward and get her surgery scheduled.  I have sent a message to Dr. Vonna Kotyk office. Encouraged her to call with any needs or concerns.

## 2020-06-25 ENCOUNTER — Ambulatory Visit: Payer: Self-pay | Admitting: Surgery

## 2020-06-25 DIAGNOSIS — C50911 Malignant neoplasm of unspecified site of right female breast: Secondary | ICD-10-CM

## 2020-06-29 ENCOUNTER — Other Ambulatory Visit: Payer: Self-pay | Admitting: Oncology

## 2020-06-29 NOTE — Progress Notes (Signed)
I called Autumn Bullock with her MammaPrint results.  Unfortunately she will need chemo if she wants to have the best prognosis.  She will see me this Friday, 07/02/2020 to discuss.

## 2020-06-30 ENCOUNTER — Encounter: Payer: Self-pay | Admitting: *Deleted

## 2020-06-30 ENCOUNTER — Telehealth: Payer: Self-pay | Admitting: *Deleted

## 2020-06-30 NOTE — Telephone Encounter (Signed)
Received Mammaprint result on Core bx of HIGH RISK. Physician team notified.

## 2020-07-01 ENCOUNTER — Other Ambulatory Visit (HOSPITAL_COMMUNITY): Payer: BC Managed Care – PPO

## 2020-07-01 ENCOUNTER — Other Ambulatory Visit: Payer: Self-pay | Admitting: Surgery

## 2020-07-01 DIAGNOSIS — C50911 Malignant neoplasm of unspecified site of right female breast: Secondary | ICD-10-CM

## 2020-07-02 ENCOUNTER — Inpatient Hospital Stay: Payer: BC Managed Care – PPO | Admitting: Oncology

## 2020-07-02 ENCOUNTER — Other Ambulatory Visit: Payer: Self-pay | Admitting: Oncology

## 2020-07-02 ENCOUNTER — Encounter: Payer: Self-pay | Admitting: Oncology

## 2020-07-02 ENCOUNTER — Other Ambulatory Visit: Payer: Self-pay

## 2020-07-02 VITALS — BP 126/66 | HR 91 | Temp 98.0°F | Resp 18 | Ht 65.5 in | Wt 158.5 lb

## 2020-07-02 DIAGNOSIS — C50411 Malignant neoplasm of upper-outer quadrant of right female breast: Secondary | ICD-10-CM

## 2020-07-02 DIAGNOSIS — Z170 Estrogen receptor positive status [ER+]: Secondary | ICD-10-CM | POA: Diagnosis not present

## 2020-07-02 MED ORDER — PROCHLORPERAZINE MALEATE 10 MG PO TABS: 10.0000 mg | ORAL_TABLET | Freq: Four times a day (QID) | ORAL | 1 refills | Status: DC | PRN

## 2020-07-02 MED ORDER — LIDOCAINE-PRILOCAINE 2.5-2.5 % EX CREA: TOPICAL_CREAM | CUTANEOUS | 3 refills | Status: DC

## 2020-07-02 MED ORDER — LORAZEPAM 0.5 MG PO TABS: 0.5000 mg | ORAL_TABLET | Freq: Every evening | ORAL | 0 refills | Status: DC | PRN

## 2020-07-02 MED ORDER — DEXAMETHASONE 4 MG PO TABS: 8.0000 mg | ORAL_TABLET | Freq: Every day | ORAL | 1 refills | Status: DC

## 2020-07-02 NOTE — Progress Notes (Signed)
START ON PATHWAY REGIMEN - Breast     Cycles 1 through 4: A cycle is every 14 days:     Doxorubicin      Cyclophosphamide      Pegfilgrastim-xxxx    Cycles 5 through 16: A cycle is every 7 days:     Paclitaxel   **Always confirm dose/schedule in your pharmacy ordering system**  Patient Characteristics: Postoperative without Neoadjuvant Therapy (Pathologic Staging), Invasive Disease, Adjuvant Therapy, HER2 Negative/Unknown/Equivocal, ER Positive, Node Negative, pT1a-c, pN0/N76m or pT2 or Higher, pN0, MammaPrint(R), High Genomic Risk Therapeutic Status: Postoperative without Neoadjuvant Therapy (Pathologic Staging) AJCC Grade: G2 AJCC N Category: pN0 AJCC M Category: cM0 ER Status: Positive (+) AJCC 8 Stage Grouping: IA HER2 Status: Negative (-) Oncotype Dx Recurrence Score: Ordered Other Genomic Test AJCC T Category: pT2 PR Status: Positive (+) Has this patient completed genomic testing<= Yes - MammaPrint(R) MammaPrint(R) Score: High Genomic Risk Intent of Therapy: Curative Intent, Discussed with Patient

## 2020-07-02 NOTE — Progress Notes (Signed)
[name]  [phone] [phone]     ID: Autumn Bullock DOB: [birth year]  MR#: [id number]  XHB#:[id number]  Patient Care Team: Brock Ra, PA-C as PCP - General Mauro Kaufmann, RN as Oncology Nurse Navigator Rockwell Germany, RN as Oncology Nurse Navigator Donnie Mesa, MD as Consulting Physician (General Surgery) Eppie Gibson, MD as Attending Physician (Radiation Oncology) Khrystyna Schwalm, Virgie Dad, MD as Consulting Physician (Oncology) Vanessa Kick, MD as Consulting Physician (Obstetrics and Gynecology) Lovett Calender, MD as Consulting Physician (Orthopedic Surgery) Chauncey Cruel, MD OTHER MD:  CHIEF COMPLAINT: Estrogen receptor positive breast cancer  CURRENT TREATMENT: Neoadjuvant chemotherapy   INTERVAL HISTORY: [name] returns today for follow up of her estrogen receptor positive breast cancer. She was evaluated in the multidisciplinary breast cancer clinic on 06/16/2020.  She is accompanied by her husband Autumn Bullock testing was performed during clinic. The results were negative with the exception of a variant of uncertain significance in MSH3.  Since consultation, she underwent breast MRI on 06/22/2020 showing: breast composition C; known malignancy in upper-outer right breast, two sites are 2.8 cm measured together; no axillary adenopathy; enhancing 7 mm skin lesion in left breast at 12 o'clock.  Mammaprint was obtained on the original biopsy sample. Results returned high risk.   REVIEW OF SYSTEMS: [name] is understandably anxious about the MammaPrint report.  She tells me the family is doing "okay".  She would like to continue to work as long as possible but is also concerned about exposure to children who have not been vaccinated.  She likely will need to go on disability at least for the first 3 months.   COVID 19 VACCINATION STATUS: Status post Pfizer x2, most recent treatment April 2021.  Patient also was diagnosed  with COVID-19 disease November 2020   HISTORY OF CURRENT ILLNESS: From the original intake note:  Autumn Bullock had routine screening mammography on 06/02/2020 showing a possible abnormality in the right breast. She underwent right diagnostic mammography with tomography and right breast ultrasonography at [facility] on 06/08/2020 showing: breast density category C; 2.3 cm right breast mass at 11:30; 0.8 cm mass superficial to dominant mass; no right axillary adenopathy.  Accordingly on 06/08/2020 she proceeded to biopsy of the right breast area in question. The pathology from this procedure ([id number]) showed: invasive and in situ mammary carcinoma, grade 2, e-cadherin positive. Both biopsied masses showed this and were found to be morphologically similar. Prognostic indicators significant for: estrogen receptor, 90% positive and progesterone receptor, 70% positive, both with strong staining intensity. Proliferation marker Ki67 at 5%. HER2 negative by immunohistochemistry (1+).  The patient's subsequent history is as detailed below.   PAST MEDICAL HISTORY: Past Medical History:  Diagnosis Date  . Family history of uterine cancer 06/16/2020    PAST SURGICAL HISTORY: Past Surgical History:  Procedure Laterality Date  . CESAREAN SECTION     2011    FAMILY HISTORY: Family History  Problem Relation Age of Onset  . Cancer Maternal Grandmother 68       Unknown GYN.  ?Uterine  As of October 2021 her parents are both living, her father at age 57 and her mother at 44, . Autumn Bullock has 1 brother and 2 sisters. She reports uterine cancer in her maternal grandmother at age 66. There is no family history of breast, ovarian, or colon cancer to her knowledge.   GYNECOLOGIC HISTORY:  No LMP recorded. Menarche: 44 years old Age at first live birth: 44  years old [place] P 1 LMP 05/05/2020, regular monthly periods, lasting 3 days with 1-2 heavy days Contraceptive: has used for 15 years, no  complications HRT n/a  Hysterectomy? no BSO? 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 Husband Autumn Bullock works in Nature conservation officer. She lives at home with Autumn Bullock and their daughter Autumn Bullock, age 66. She attends [employer] in [place].    ADVANCED DIRECTIVES: In the absence of any documentation to the contrary, the patient's spouse is their HCPOA.    HEALTH MAINTENANCE: Social History   Tobacco Use  . Smoking status: Never Smoker  Substance Use Topics  . Alcohol use: Yes    Comment: 5/week  . Drug use: Never     Colonoscopy: 1999?  PAP: 05/31/2020  Bone density: never done   No Known Allergies  No current outpatient medications on file.   No current facility-administered medications for this visit.    OBJECTIVE: White woman who appears well  Vitals:   07/02/20 1334  BP: 126/66  Pulse: 91  Resp: 18  Temp: 98 F (36.7 C)  SpO2: 100%     Body mass index is 25.97 kg/m.   Wt Readings from Last 3 Encounters:  07/02/20 158 lb 8 oz (71.9 kg)  06/16/20 158 lb 8 oz (71.9 kg)      ECOG FS:0 - Asymptomatic  Sclerae unicteric, EOMs intact Wearing a mask No cervical or supraclavicular adenopathy Lungs no rales or rhonchi Heart regular rate and rhythm Abd soft, nontender, positive bowel sounds MSK no focal spinal tenderness, no upper extremity lymphedema Neuro: nonfocal, well oriented, appropriate affect Breasts: Deferred  LAB RESULTS:  CMP     Component Value Date/Time   NA 140 06/16/2020 0819   K 4.1 06/16/2020 0819   CL 104 06/16/2020 0819   CO2 30 06/16/2020 0819   GLUCOSE 123 (H) 06/16/2020 0819   BUN 15 06/16/2020 0819   CREATININE 0.83 06/16/2020 0819   CALCIUM 9.5 06/16/2020 0819   PROT 7.6 06/16/2020 0819   ALBUMIN 4.4 06/16/2020 0819   AST 18 06/16/2020 0819   ALT 15 06/16/2020 0819   ALKPHOS 64 06/16/2020 0819   BILITOT 0.4 06/16/2020 0819   GFRNONAA >60 06/16/2020 0819     No results found for: TOTALPROTELP, ALBUMINELP, A1GS, A2GS, BETS, BETA2SER, GAMS, MSPIKE, SPEI  Lab Results  Component Value Date   WBC 6.0 06/16/2020   NEUTROABS 4.1 06/16/2020   HGB 13.5 06/16/2020   HCT 39.5 06/16/2020   MCV 96.6 06/16/2020   PLT 263 06/16/2020    No results found for: LABCA2  No components found for: QASTMH962  No results for input(s): INR in the last 168 hours.  No results found for: LABCA2  No results found for: IWL798  No results found for: XQJ194  No results found for: RDE081  No results found for: CA2729  No components found for: HGQUANT  No results found for: CEA1 / No results found for: CEA1   No results found for: AFPTUMOR  No results found for: CHROMOGRNA  No results found for: KPAFRELGTCHN, LAMBDASER, KAPLAMBRATIO (kappa/lambda light chains)  No results found for: HGBA, HGBA2QUANT, HGBFQUANT, HGBSQUAN (Hemoglobinopathy evaluation)   No results found for: LDH  No results found for: IRON, TIBC, IRONPCTSAT (Iron and TIBC)  No results found for: FERRITIN  Urinalysis No results found for: COLORURINE, APPEARANCEUR, LABSPEC, PHURINE, GLUCOSEU, HGBUR, BILIRUBINUR, KETONESUR, PROTEINUR, UROBILINOGEN, NITRITE, LEUKOCYTESUR   STUDIES: MR BREAST BILATERAL  W WO CONTRAST INC CAD  Result Date: 06/23/2020 CLINICAL DATA:  Recent diagnosis of grade 2 invasive mammary carcinoma at 2 sites in the 11:30 o'clock location of the RIGHT breast, 3 centimeters from the nipple. LABS:  None obtained at the time of imaging. EXAM: BILATERAL BREAST MRI WITH AND WITHOUT CONTRAST TECHNIQUE: Multiplanar, multisequence MR images of both breasts were obtained prior to and following the intravenous administration of 7 ml of Gadavist Three-dimensional MR images were rendered by post-processing of the original MR data on an independent workstation. The three-dimensional MR images were interpreted, and findings are reported in the following complete MRI report for  this study. Three dimensional images were evaluated at the independent interpreting workstation using the DynaCAD thin client. COMPARISON:  Previous exam(s). FINDINGS: Breast composition: c. Heterogeneous fibroglandular tissue. Background parenchymal enhancement: Moderate. Right breast: Enhancing irregular mass is identified in the UPPER-OUTER QUADRANT of the RIGHT breast and associated with 2 tissue marker clips. Mass is 2.8 x 1.9 x 2.5 cm. Mass demonstrates primarily persistent type enhancement kinetics and correlates well with the known 2 sites of malignancy in the 11:30 o'clock location of the RIGHT breast. No other suspicious findings identified in the RIGHT breast. Left breast: There is an enhancing 7 millimeters skin lesion in the 12 o'clock location of the LEFT breast approximately 8-9 centimeters from the nipple. This is favored to represent a benign skin lesion and correlation with physical exam is recommended. Lymph nodes: No abnormal appearing lymph nodes. Ancillary findings:  None. IMPRESSION: 1. Known malignancy in the UPPER-OUTER QUADRANT of the RIGHT breast. A 2 sites are measured together and are 2.8 centimeters. 2. No axillary adenopathy. 3. Enhancing skin lesion in the LEFT breast, approximately 12 o'clock location 8 9 centimeters from the nipple warranting correlation with physical exam. RECOMMENDATION: 1. Treatment plan for known RIGHT breast malignancy. 2. Recommend physical exam correlation to evaluate skin lesion in the 12 o'clock location of the LEFT breast. BI-RADS CATEGORY  6: Known biopsy-proven malignancy. Electronically Signed   By: Nolon Nations M.D.   On: 06/23/2020 13:34   US BREAST LTD UNI RIGHT INC AXILLA  Result Date: 06/08/2020 CLINICAL DATA:  Patient returns after screening for evaluation possible RIGHT breast mass. EXAM: DIGITAL DIAGNOSTIC RIGHT MAMMOGRAM WITH CAD AND TOMO ULTRASOUND RIGHT BREAST COMPARISON:  Previous exam(s). ACR Breast Density Category c: The breast  tissue is heterogeneously dense, which may obscure small masses. FINDINGS: Additional 2-D and 3-D images are performed. These views confirm presence of a spiculated mass in the UPPER-OUTER QUADRANT of the RIGHT breast. No definitive abnormality identified in the retroareolar region. Mammographic images were processed with CAD. On physical exam, I palpate focal soft thickening in the 11:30 location of the RIGHT breast. I palpate no discrete mass. Targeted ultrasound is performed, showing irregular mass with indistinct margins in the 11:30 o'clock location of the RIGHT breast 3 centimeters from the nipple. Mass is 2.3 x 1.5 x 1.7 centimeters. No blood flow identified on Doppler evaluation. Just superficial to this mass, also in the 11:30 o'clock location 3 centimeters from the nipple, there is a spiculated solid mass with internal blood flow measuring 0.8 x 0.6 x 0.7 centimeters. In the 11:30 o'clock location of the RIGHT breast 4 centimeters from the nipple, there is an anechoic mass with internal septations measuring 0.8 x 0.5 x 0.8 centimeters. In the 12 o'clock location 3 centimeters from the nipple, there is a simple cyst measuring 0.6 centimeters. Evaluation of the RIGHT axilla shows no  adenopathy. IMPRESSION: 1. Suspicious 2.3 centimeter mass in the 11:30 o'clock location of the RIGHT breast warranting tissue diagnosis. 2. Suspicious 0.8 centimeter spiculated mass superficial to the larger mass also warranting tissue diagnosis. 3. No RIGHT axillary adenopathy. RECOMMENDATION: 1. Recommend ultrasound-guided core biopsy of mass in the 11:30 o'clock location of the RIGHT breast. 2. Recommend ultrasound-guided core biopsy of more superficial mass in the 11:30 o'clock location of the RIGHT breast. 3. Biopsies will be performed later today. I have discussed the findings and recommendations with the patient. If applicable, a reminder letter will be sent to the patient regarding the next appointment. BI-RADS CATEGORY   4: Suspicious. Electronically Signed   By: Nolon Nations M.D.   On: 06/08/2020 12:20   MM DIAG BREAST TOMO UNI RIGHT  Result Date: 06/08/2020 CLINICAL DATA:  Patient returns after screening for evaluation possible RIGHT breast mass. EXAM: DIGITAL DIAGNOSTIC RIGHT MAMMOGRAM WITH CAD AND TOMO ULTRASOUND RIGHT BREAST COMPARISON:  Previous exam(s). ACR Breast Density Category c: The breast tissue is heterogeneously dense, which may obscure small masses. FINDINGS: Additional 2-D and 3-D images are performed. These views confirm presence of a spiculated mass in the UPPER-OUTER QUADRANT of the RIGHT breast. No definitive abnormality identified in the retroareolar region. Mammographic images were processed with CAD. On physical exam, I palpate focal soft thickening in the 11:30 location of the RIGHT breast. I palpate no discrete mass. Targeted ultrasound is performed, showing irregular mass with indistinct margins in the 11:30 o'clock location of the RIGHT breast 3 centimeters from the nipple. Mass is 2.3 x 1.5 x 1.7 centimeters. No blood flow identified on Doppler evaluation. Just superficial to this mass, also in the 11:30 o'clock location 3 centimeters from the nipple, there is a spiculated solid mass with internal blood flow measuring 0.8 x 0.6 x 0.7 centimeters. In the 11:30 o'clock location of the RIGHT breast 4 centimeters from the nipple, there is an anechoic mass with internal septations measuring 0.8 x 0.5 x 0.8 centimeters. In the 12 o'clock location 3 centimeters from the nipple, there is a simple cyst measuring 0.6 centimeters. Evaluation of the RIGHT axilla shows no adenopathy. IMPRESSION: 1. Suspicious 2.3 centimeter mass in the 11:30 o'clock location of the RIGHT breast warranting tissue diagnosis. 2. Suspicious 0.8 centimeter spiculated mass superficial to the larger mass also warranting tissue diagnosis. 3. No RIGHT axillary adenopathy. RECOMMENDATION: 1. Recommend ultrasound-guided core biopsy of  mass in the 11:30 o'clock location of the RIGHT breast. 2. Recommend ultrasound-guided core biopsy of more superficial mass in the 11:30 o'clock location of the RIGHT breast. 3. Biopsies will be performed later today. I have discussed the findings and recommendations with the patient. If applicable, a reminder letter will be sent to the patient regarding the next appointment. BI-RADS CATEGORY  4: Suspicious. Electronically Signed   By: Nolon Nations M.D.   On: 06/08/2020 12:20   MM CLIP PLACEMENT RIGHT  Result Date: 06/08/2020 CLINICAL DATA:  Status post ultrasound-guided core biopsies of 2 masses in the RIGHT breast at the 11:30 o'clock axis. EXAM: DIAGNOSTIC RIGHT MAMMOGRAM POST ULTRASOUND BIOPSY x2 COMPARISON:  Previous exam(s). FINDINGS: Mammographic images were obtained following ultrasound guided biopsy of 2 adjacent masses in the RIGHT breast at the 11:30 o'clock axis. Both biopsy marking clips are in expected position at the sites of the biopsies. IMPRESSION: 1. Appropriate positioning of the ribbon shaped biopsy marking clip at the site of biopsy in the upper-outer quadrant of the RIGHT breast. 2. Appropriate positioning of  the coil shaped biopsy marking clip at the site of biopsy in the upper-outer quadrant of the RIGHT breast. Final Assessment: Post Procedure Mammograms for Marker Placement Electronically Signed   By: Franki Cabot M.D.   On: 06/08/2020 16:03   [place] RT BREAST BX W LOC DEV 1ST LESION IMG BX SPEC US GUIDE  Addendum Date: 06/10/2020   ADDENDUM REPORT: 06/09/2020 11:16 ADDENDUM: Pathology revealed GRADE II INVASIVE MAMMARY CARCINOMA, MAMMARY CARCINOMA IN SITU of the RIGHT breast, 11:30 o'clock axis. This was found to be concordant by Dr. Franki Cabot. Pathology revealed GRADE II INVASIVE MAMMARY CARCINOMA, MAMMARY CARCINOMA IN SITU of the RIGHT breast, 11:30 o'clock axis. This was found to be concordant by Dr. Franki Cabot. Pathology results were discussed with the patient by  telephone. The patient reported doing well after the biopsies with tenderness at the sites. Post biopsy instructions and care were reviewed and questions were answered. The patient was encouraged to call [facility] for any additional concerns. The patient was referred to [facility] on June 16, 2020. Pathology results reported by Stacie Acres RN on 06/09/2020. Electronically Signed   By: Franki Cabot M.D.   On: 06/09/2020 11:16   Result Date: 06/10/2020 CLINICAL DATA:  Patient with 2 adjacent suspicious masses within the outer RIGHT breast, both at the 11:30 o'clock axis, presents for ultrasound-guided core biopsies of each. EXAM: ULTRASOUND GUIDED RIGHT BREAST CORE NEEDLE BIOPSY x2 COMPARISON:  Previous exam(s). PROCEDURE: I met with the patient and we discussed the procedure of ultrasound-guided biopsy, including benefits and alternatives. We discussed the high likelihood of a successful procedure. We discussed the risks of the procedure, including infection, bleeding, tissue injury, clip migration, and inadequate sampling. Informed written consent was given. The usual time-out protocol was performed immediately prior to the procedure. Site 1: Lesion quadrant: Upper outer quadrant Using sterile technique and 1% Lidocaine as local anesthetic, under direct ultrasound visualization, a 12 gauge spring-loaded device was used to perform biopsy of the smaller more superficial RIGHT breast mass at the 11:30 o'clock axis using a lateral approach. At the conclusion of the procedure ribbon shaped tissue marker clip was deployed into the biopsy cavity. Site 2: Lesion quadrant: Upper outer quadrant Using sterile technique and 1% Lidocaine as local anesthetic, under direct ultrasound visualization, a 12 gauge spring-loaded device was used to perform biopsy of the larger deeper mass in the RIGHT breast at the 11:30  o'clock using a lateral approach. At the conclusion of the procedure coil shaped tissue marker clip was deployed into the biopsy cavity. Follow up 2 view mammogram was performed and dictated separately. IMPRESSION: 1. Ultrasound guided biopsy of the smaller more superficial mass in the RIGHT breast at the 11:30 o'clock axis (ribbon clip). No apparent complications. 2. Ultrasound guided biopsy of the larger deeper mass in the RIGHT breast at the 11:30 o'clock axis (coil clip). No apparent complications. Electronically Signed: By: Franki Cabot M.D. On: 06/08/2020 15:49   [place] RT BREAST BX W LOC DEV EA ADD LESION IMG BX SPEC US GUIDE  Addendum Date: 06/10/2020   ADDENDUM REPORT: 06/09/2020 11:16 ADDENDUM: Pathology revealed GRADE II INVASIVE MAMMARY CARCINOMA, MAMMARY CARCINOMA IN SITU of the RIGHT breast, 11:30 o'clock axis. This was found to be concordant by Dr. Franki Cabot. Pathology revealed GRADE II INVASIVE MAMMARY CARCINOMA, MAMMARY CARCINOMA IN SITU of the RIGHT breast, 11:30 o'clock axis. This was found to be concordant  by Dr. Franki Cabot. Pathology results were discussed with the patient by telephone. The patient reported doing well after the biopsies with tenderness at the sites. Post biopsy instructions and care were reviewed and questions were answered. The patient was encouraged to call [facility] for any additional concerns. The patient was referred to [facility] on June 16, 2020. Pathology results reported by Stacie Acres RN on 06/09/2020. Electronically Signed   By: Franki Cabot M.D.   On: 06/09/2020 11:16   Result Date: 06/10/2020 CLINICAL DATA:  Patient with 2 adjacent suspicious masses within the outer RIGHT breast, both at the 11:30 o'clock axis, presents for ultrasound-guided core biopsies of each. EXAM: ULTRASOUND GUIDED RIGHT BREAST CORE NEEDLE BIOPSY x2 COMPARISON:  Previous  exam(s). PROCEDURE: I met with the patient and we discussed the procedure of ultrasound-guided biopsy, including benefits and alternatives. We discussed the high likelihood of a successful procedure. We discussed the risks of the procedure, including infection, bleeding, tissue injury, clip migration, and inadequate sampling. Informed written consent was given. The usual time-out protocol was performed immediately prior to the procedure. Site 1: Lesion quadrant: Upper outer quadrant Using sterile technique and 1% Lidocaine as local anesthetic, under direct ultrasound visualization, a 12 gauge spring-loaded device was used to perform biopsy of the smaller more superficial RIGHT breast mass at the 11:30 o'clock axis using a lateral approach. At the conclusion of the procedure ribbon shaped tissue marker clip was deployed into the biopsy cavity. Site 2: Lesion quadrant: Upper outer quadrant Using sterile technique and 1% Lidocaine as local anesthetic, under direct ultrasound visualization, a 12 gauge spring-loaded device was used to perform biopsy of the larger deeper mass in the RIGHT breast at the 11:30 o'clock using a lateral approach. At the conclusion of the procedure coil shaped tissue marker clip was deployed into the biopsy cavity. Follow up 2 view mammogram was performed and dictated separately. IMPRESSION: 1. Ultrasound guided biopsy of the smaller more superficial mass in the RIGHT breast at the 11:30 o'clock axis (ribbon clip). No apparent complications. 2. Ultrasound guided biopsy of the larger deeper mass in the RIGHT breast at the 11:30 o'clock axis (coil clip). No apparent complications. Electronically Signed: By: Franki Cabot M.D. On: 06/08/2020 15:49     ELIGIBLE FOR AVAILABLE RESEARCH PROTOCOL: AET  ASSESSMENT: 44 y.o. [place] woman status post right breast upper outer quadrant biopsy 06/08/2020 for a clinical mT2 N0, stage IB invasive ductal carcinoma, grade 2, estrogen and progesterone  receptor positive, HER-2 not amplified, with an MIB-1 of 5%.  (1) genetics testing 06/22/2020 through the  (a) Negative genetic testing: no pathogenic variants detected in [facility] Common Hereditary Cancers Panel. The report date is June 22, 2020.    The Common Hereditary Cancers Panel offered by [facility] includes sequencing and/or deletion duplication testing of the following 48 genes: APC, ATM, AXIN2, BARD1, BMPR1A, BRCA1, BRCA2, BRIP1, CDH1, CDK4, CDKN2A (p14ARF), CDKN2A (p16INK4a), CHEK2, CTNNA1, DICER1, EPCAM (Deletion/duplication testing only), GREM1 (promoter region deletion/duplication testing only), KIT, MEN1, MLH1, MSH2, MSH3, MSH6, MUTYH, NBN, NF1, NHTL1, PALB2, PDGFRA, PMS2, POLD1, POLE, PTEN, RAD50, RAD51C, RAD51D, RNF43, SDHB, SDHC, SDHD, SMAD4, SMARCA4. STK11, TP53, TSC1, TSC2, and VHL.  The following genes were evaluated for sequence changes only: SDHA and HOXB13 c.251G>A variant only.  (a)  a variant of uncertain significance in MSH3 at c.2731T>G (p.Leu911Val).   (2) definitive surgery 07/13/2020  (3) MammaPrint obtained from  the initial biopsy showed high risk, predicting a 93% 5-year disease-free survival with chemotherapy and significant benefit from chemotherapy (greater than 12%).  (4) cyclophosphamide and doxorubicin in dose dense fashion x4 to start 08/03/2020 followed by paclitaxel weekly x12  (5) adjuvant radiation to follow  (6) antiestrogens to start at the completion of local treatment   PLAN: I the MammaPrint results with Rilea and her husband.  She understands that this predicts a very good disease-free survival but to obtain that she will need to receive chemotherapy.  The benefit of chemotherapy is predicted to be greater than 12%.  We discussed the "gold standard" chemotherapy namely cyclophosphamide and doxorubicin in dose dense fashion x4.  She has a good understanding of the possible toxicities side effects and complications of these agents.  She will  further discuss this with the chemotherapy teaching nurse before starting treatment and she will also have a tour of the treatment area  She will need an echocardiogram and a port.  I have alerted her surgeon and also entered an order for the echo.  We are hoping to start chemotherapy 08/03/2020, which will work best in terms of the coming holidays.  She will make sure we get the human resources documents on time so that we can get them completed for her as she is planning on a minimum of 3 months, more likely 6 of disability.  Total encounter time 45 minutes.Sarajane Jews C. Marielena Harvell, MD 07/02/2020 5:58 PM Medical Oncology and Hematology [facility] [place], [place] [address] Tel. [phone]    Fax. [phone]   This document serves as a record of services personally performed by Lurline Del, MD. It was created on his behalf by Wilburn Mylar, a trained medical scribe. The creation of this record is based on the scribe's personal observations and the provider's statements to them.   I, Lurline Del MD, have reviewed the above documentation for accuracy and completeness, and I agree with the above.   *Total Encounter Time as defined by the Centers for Medicare and Medicaid Services includes, in addition to the face-to-face time of a patient visit (documented in the note above) non-face-to-face time: obtaining and reviewing outside history, ordering and reviewing medications, tests or procedures, care coordination (communications with other health care professionals or caregivers) and documentation in the medical record.

## 2020-07-03 MED FILL — LIDOCAINE-PRILOCAINE CREAM: 2.5-2.5 | 21 days supply | Qty: 30 | Fill #0

## 2020-07-05 ENCOUNTER — Encounter: Payer: Self-pay | Admitting: *Deleted

## 2020-07-05 ENCOUNTER — Ambulatory Visit: Payer: Self-pay | Admitting: Surgery

## 2020-07-05 MED FILL — PROCHLORPERAZINE 10 MG TAB: 10 | 7 days supply | Qty: 30 | Fill #0

## 2020-07-05 MED FILL — DEXAMETHASONE 4 MG TABLET: 4 | 15 days supply | Qty: 30 | Fill #0

## 2020-07-05 MED FILL — LORazepam 0.5 MG TABS: 0.5 | 30 days supply | Qty: 30 | Fill #0

## 2020-07-06 ENCOUNTER — Other Ambulatory Visit: Payer: Self-pay

## 2020-07-06 ENCOUNTER — Encounter (HOSPITAL_BASED_OUTPATIENT_CLINIC_OR_DEPARTMENT_OTHER): Payer: Self-pay | Admitting: Surgery

## 2020-07-07 ENCOUNTER — Telehealth: Payer: Self-pay | Admitting: *Deleted

## 2020-07-07 ENCOUNTER — Encounter: Payer: Self-pay | Admitting: *Deleted

## 2020-07-07 NOTE — Telephone Encounter (Signed)
Received fax for FMLA from [employer].  Placed on Roz's desk.

## 2020-07-09 ENCOUNTER — Other Ambulatory Visit: Payer: Self-pay

## 2020-07-09 ENCOUNTER — Ambulatory Visit (HOSPITAL_COMMUNITY): Payer: BC Managed Care – PPO | Attending: Cardiology

## 2020-07-09 DIAGNOSIS — C50411 Malignant neoplasm of upper-outer quadrant of right female breast: Secondary | ICD-10-CM

## 2020-07-09 DIAGNOSIS — Z0189 Encounter for other specified special examinations: Secondary | ICD-10-CM

## 2020-07-09 DIAGNOSIS — Z170 Estrogen receptor positive status [ER+]: Secondary | ICD-10-CM

## 2020-07-09 LAB — ECHOCARDIOGRAM COMPLETE
Area-P 1/2: 3.12 cm2
S' Lateral: 3.1 cm

## 2020-07-10 ENCOUNTER — Other Ambulatory Visit (HOSPITAL_COMMUNITY)
Admission: RE | Admit: 2020-07-10 | Discharge: 2020-07-10 | Disposition: A | Discharge status: Home / Self Care | Payer: BC Managed Care – PPO | Source: Ambulatory Visit | Attending: Surgery | Admitting: Surgery

## 2020-07-10 DIAGNOSIS — Z01812 Encounter for preprocedural laboratory examination: Secondary | ICD-10-CM | POA: Insufficient documentation

## 2020-07-10 DIAGNOSIS — Z20822 Contact with and (suspected) exposure to covid-19: Secondary | ICD-10-CM | POA: Insufficient documentation

## 2020-07-10 LAB — SARS CORONAVIRUS 2 (TAT 6-24 HRS): SARS Coronavirus 2: NEGATIVE

## 2020-07-13 ENCOUNTER — Ambulatory Visit
Admission: RE | Admit: 2020-07-13 | Discharge: 2020-07-13 | Disposition: A | Discharge status: Home / Self Care | Payer: BC Managed Care – PPO | Source: Ambulatory Visit | Attending: Surgery | Admitting: Surgery

## 2020-07-13 ENCOUNTER — Other Ambulatory Visit: Payer: Self-pay

## 2020-07-13 DIAGNOSIS — C50911 Malignant neoplasm of unspecified site of right female breast: Secondary | ICD-10-CM

## 2020-07-14 ENCOUNTER — Other Ambulatory Visit: Payer: Self-pay

## 2020-07-14 ENCOUNTER — Ambulatory Visit (HOSPITAL_COMMUNITY): Payer: BC Managed Care – PPO

## 2020-07-14 ENCOUNTER — Encounter (HOSPITAL_BASED_OUTPATIENT_CLINIC_OR_DEPARTMENT_OTHER): Payer: Self-pay | Admitting: Surgery

## 2020-07-14 ENCOUNTER — Encounter (HOSPITAL_BASED_OUTPATIENT_CLINIC_OR_DEPARTMENT_OTHER)
Admission: RE | Disposition: A | Discharge status: Home / Self Care | Payer: Self-pay | Source: Home / Self Care | Attending: Surgery

## 2020-07-14 ENCOUNTER — Ambulatory Visit (HOSPITAL_BASED_OUTPATIENT_CLINIC_OR_DEPARTMENT_OTHER): Payer: BC Managed Care – PPO | Admitting: Certified Registered"

## 2020-07-14 ENCOUNTER — Ambulatory Visit (HOSPITAL_COMMUNITY)
Admission: RE | Admit: 2020-07-14 | Discharge: 2020-07-14 | Disposition: A | Discharge status: Home / Self Care | Payer: BC Managed Care – PPO | Source: Ambulatory Visit | Attending: Surgery | Admitting: Surgery

## 2020-07-14 ENCOUNTER — Ambulatory Visit
Admission: RE | Admit: 2020-07-14 | Discharge: 2020-07-14 | Disposition: A | Discharge status: Home / Self Care | Payer: BC Managed Care – PPO | Source: Ambulatory Visit | Attending: Surgery | Admitting: Surgery

## 2020-07-14 ENCOUNTER — Ambulatory Visit (HOSPITAL_BASED_OUTPATIENT_CLINIC_OR_DEPARTMENT_OTHER)
Admission: RE | Admit: 2020-07-14 | Discharge: 2020-07-14 | Disposition: A | Discharge status: Home / Self Care | Payer: BC Managed Care – PPO | Attending: Surgery | Admitting: Surgery

## 2020-07-14 DIAGNOSIS — Z87891 Personal history of nicotine dependence: Secondary | ICD-10-CM | POA: Diagnosis not present

## 2020-07-14 DIAGNOSIS — Z452 Encounter for adjustment and management of vascular access device: Secondary | ICD-10-CM

## 2020-07-14 DIAGNOSIS — C50911 Malignant neoplasm of unspecified site of right female breast: Secondary | ICD-10-CM

## 2020-07-14 DIAGNOSIS — C773 Secondary and unspecified malignant neoplasm of axilla and upper limb lymph nodes: Secondary | ICD-10-CM | POA: Diagnosis not present

## 2020-07-14 DIAGNOSIS — C50411 Malignant neoplasm of upper-outer quadrant of right female breast: Secondary | ICD-10-CM | POA: Diagnosis present

## 2020-07-14 DIAGNOSIS — Z170 Estrogen receptor positive status [ER+]: Secondary | ICD-10-CM | POA: Insufficient documentation

## 2020-07-14 DIAGNOSIS — Z95828 Presence of other vascular implants and grafts: Secondary | ICD-10-CM

## 2020-07-14 HISTORY — PX: PORTACATH PLACEMENT: SHX2246

## 2020-07-14 HISTORY — PX: BREAST LUMPECTOMY WITH RADIOACTIVE SEED AND SENTINEL LYMPH NODE BIOPSY: SHX6550

## 2020-07-14 LAB — POCT PREGNANCY, URINE: Preg Test, Ur: NEGATIVE

## 2020-07-14 SURGERY — BREAST LUMPECTOMY WITH RADIOACTIVE SEED AND SENTINEL LYMPH NODE BIOPSY
Anesthesia: General | Site: Chest | Laterality: Right

## 2020-07-14 MED ORDER — GABAPENTIN 300 MG PO CAPS
ORAL_CAPSULE | ORAL | Status: AC
  Filled 2020-07-14: qty 1

## 2020-07-14 MED ORDER — CHLORHEXIDINE GLUCONATE CLOTH 2 % EX PADS: 6.0000 | MEDICATED_PAD | Freq: Once | CUTANEOUS | Status: DC

## 2020-07-14 MED ORDER — DEXAMETHASONE SODIUM PHOSPHATE 10 MG/ML IJ SOLN
INTRAMUSCULAR | Status: DC | PRN
  Administered 2020-07-14: 10 mg via INTRAVENOUS

## 2020-07-14 MED ORDER — SCOPOLAMINE 1 MG/3DAYS TD PT72
MEDICATED_PATCH | TRANSDERMAL | Status: AC
  Filled 2020-07-14: qty 1

## 2020-07-14 MED ORDER — GABAPENTIN 300 MG PO CAPS
300.0000 mg | ORAL_CAPSULE | ORAL | Status: AC
  Administered 2020-07-14: 300 mg via ORAL

## 2020-07-14 MED ORDER — ALCOHOL 98 % IJ SOLN
INTRAMUSCULAR | Status: AC
  Filled 2020-07-14: qty 5

## 2020-07-14 MED ORDER — SODIUM CHLORIDE (PF) 0.9 % IJ SOLN
INTRAVENOUS | Status: DC | PRN
  Administered 2020-07-14: 5 mL

## 2020-07-14 MED ORDER — DIPHENHYDRAMINE HCL 50 MG/ML IJ SOLN
INTRAMUSCULAR | Status: AC
  Filled 2020-07-14: qty 1

## 2020-07-14 MED ORDER — CEFAZOLIN SODIUM-DEXTROSE 2-4 GM/100ML-% IV SOLN
INTRAVENOUS | Status: AC
  Filled 2020-07-14: qty 100

## 2020-07-14 MED ORDER — SODIUM BICARBONATE 4.2 % IV SOLN
INTRAVENOUS | Status: AC
  Filled 2020-07-14: qty 10

## 2020-07-14 MED ORDER — MIDAZOLAM HCL 2 MG/2ML IJ SOLN
INTRAMUSCULAR | Status: AC
  Filled 2020-07-14: qty 2

## 2020-07-14 MED ORDER — FENTANYL CITRATE (PF) 100 MCG/2ML IJ SOLN
INTRAMUSCULAR | Status: DC | PRN
  Administered 2020-07-14 (×2): 25 ug via INTRAVENOUS
  Administered 2020-07-14: 50 ug via INTRAVENOUS

## 2020-07-14 MED ORDER — ACETAMINOPHEN 500 MG PO TABS
ORAL_TABLET | ORAL | Status: AC
  Filled 2020-07-14: qty 2

## 2020-07-14 MED ORDER — LIDOCAINE HCL (CARDIAC) PF 100 MG/5ML IV SOSY
PREFILLED_SYRINGE | INTRAVENOUS | Status: DC | PRN
  Administered 2020-07-14: 100 mg via INTRAVENOUS

## 2020-07-14 MED ORDER — DIPHENHYDRAMINE HCL 50 MG/ML IJ SOLN
INTRAMUSCULAR | Status: DC | PRN
  Administered 2020-07-14: 12.5 mg via INTRAVENOUS

## 2020-07-14 MED ORDER — ONDANSETRON HCL 4 MG/2ML IJ SOLN
INTRAMUSCULAR | Status: AC
  Filled 2020-07-14: qty 2

## 2020-07-14 MED ORDER — PROPOFOL 10 MG/ML IV BOLUS
INTRAVENOUS | Status: AC
  Filled 2020-07-14: qty 20

## 2020-07-14 MED ORDER — MIDAZOLAM HCL 2 MG/2ML IJ SOLN
2.0000 mg | Freq: Once | INTRAMUSCULAR | Status: AC
  Administered 2020-07-14: 2 mg via INTRAVENOUS

## 2020-07-14 MED ORDER — SUCCINYLCHOLINE CHLORIDE 20 MG/ML IJ SOLN
INTRAMUSCULAR | Status: DC | PRN
  Administered 2020-07-14: 120 mg via INTRAVENOUS

## 2020-07-14 MED ORDER — EPHEDRINE 5 MG/ML INJ
INTRAVENOUS | Status: AC
  Filled 2020-07-14: qty 10

## 2020-07-14 MED ORDER — PROPOFOL 10 MG/ML IV BOLUS
INTRAVENOUS | Status: DC | PRN
  Administered 2020-07-14: 30 mg via INTRAVENOUS
  Administered 2020-07-14: 170 mg via INTRAVENOUS

## 2020-07-14 MED ORDER — HEPARIN (PORCINE) IN NACL 2-0.9 UNITS/ML
INTRAMUSCULAR | Status: AC | PRN
  Administered 2020-07-14: 1 via INTRAVENOUS

## 2020-07-14 MED ORDER — TECHNETIUM TC 99M TILMANOCEPT KIT
1.0000 | PACK | Freq: Once | INTRAVENOUS | Status: AC | PRN
  Administered 2020-07-14: 1 via INTRADERMAL

## 2020-07-14 MED ORDER — LACTATED RINGERS IV SOLN: INTRAVENOUS | Status: DC

## 2020-07-14 MED ORDER — ROCURONIUM BROMIDE 10 MG/ML (PF) SYRINGE
PREFILLED_SYRINGE | INTRAVENOUS | Status: AC
  Filled 2020-07-14: qty 10

## 2020-07-14 MED ORDER — SODIUM CHLORIDE (PF) 0.9 % IJ SOLN
INTRAMUSCULAR | Status: AC
  Filled 2020-07-14: qty 10

## 2020-07-14 MED ORDER — EPHEDRINE SULFATE 50 MG/ML IJ SOLN
INTRAMUSCULAR | Status: DC | PRN
  Administered 2020-07-14 (×2): 10 mg via INTRAVENOUS

## 2020-07-14 MED ORDER — BUPIVACAINE HCL 0.25 % IJ SOLN
INTRAMUSCULAR | Status: DC | PRN
  Administered 2020-07-14: 20 mL

## 2020-07-14 MED ORDER — LIDOCAINE 2% (20 MG/ML) 5 ML SYRINGE
INTRAMUSCULAR | Status: AC
  Filled 2020-07-14: qty 5

## 2020-07-14 MED ORDER — HYDROMORPHONE HCL 1 MG/ML IJ SOLN: 0.2500 mg | INTRAMUSCULAR | Status: DC | PRN

## 2020-07-14 MED ORDER — FENTANYL CITRATE (PF) 100 MCG/2ML IJ SOLN
100.0000 ug | Freq: Once | INTRAMUSCULAR | Status: AC
  Administered 2020-07-14: 100 ug via INTRAVENOUS

## 2020-07-14 MED ORDER — CEFAZOLIN SODIUM-DEXTROSE 2-4 GM/100ML-% IV SOLN
2.0000 g | INTRAVENOUS | Status: AC
  Administered 2020-07-14: 2 g via INTRAVENOUS

## 2020-07-14 MED ORDER — SCOPOLAMINE 1 MG/3DAYS TD PT72
1.0000 | MEDICATED_PATCH | TRANSDERMAL | Status: DC
  Administered 2020-07-14: 1.5 mg via TRANSDERMAL

## 2020-07-14 MED ORDER — HEPARIN SOD (PORK) LOCK FLUSH 100 UNIT/ML IV SOLN
INTRAVENOUS | Status: DC | PRN
  Administered 2020-07-14: 500 [IU] via INTRAVENOUS

## 2020-07-14 MED ORDER — ONDANSETRON HCL 4 MG/2ML IJ SOLN
INTRAMUSCULAR | Status: DC | PRN
  Administered 2020-07-14: 4 mg via INTRAVENOUS

## 2020-07-14 MED ORDER — ACETAMINOPHEN 500 MG PO TABS
1000.0000 mg | ORAL_TABLET | ORAL | Status: AC
  Administered 2020-07-14: 1000 mg via ORAL

## 2020-07-14 MED ORDER — AMISULPRIDE (ANTIEMETIC) 5 MG/2ML IV SOLN
INTRAVENOUS | Status: AC
  Filled 2020-07-14: qty 4

## 2020-07-14 MED ORDER — FENTANYL CITRATE (PF) 100 MCG/2ML IJ SOLN
INTRAMUSCULAR | Status: AC
  Filled 2020-07-14: qty 2

## 2020-07-14 MED ORDER — OXYCODONE HCL 5 MG PO TABS: 5.0000 mg | ORAL_TABLET | Freq: Four times a day (QID) | ORAL | 0 refills | Status: DC | PRN

## 2020-07-14 MED ORDER — SUCCINYLCHOLINE CHLORIDE 200 MG/10ML IV SOSY
PREFILLED_SYRINGE | INTRAVENOUS | Status: AC
  Filled 2020-07-14: qty 10

## 2020-07-14 MED ORDER — AMISULPRIDE (ANTIEMETIC) 5 MG/2ML IV SOLN
10.0000 mg | Freq: Once | INTRAVENOUS | Status: AC
  Administered 2020-07-14: 10 mg via INTRAVENOUS

## 2020-07-14 MED ORDER — METHYLENE BLUE 0.5 % INJ SOLN
INTRAVENOUS | Status: AC
  Filled 2020-07-14: qty 10

## 2020-07-14 SURGICAL SUPPLY — 68 items
APL PRP STRL LF DISP 70% ISPRP (MISCELLANEOUS) ×4
APL SKNCLS STERI-STRIP NONHPOA (GAUZE/BANDAGES/DRESSINGS) ×2
APPLIER CLIP 9.375 MED OPEN (MISCELLANEOUS) ×3
APR CLP MED 9.3 20 MLT OPN (MISCELLANEOUS) ×2
BAG DECANTER FOR FLEXI CONT (MISCELLANEOUS) ×3 IMPLANT
BENZOIN TINCTURE PRP APPL 2/3 (GAUZE/BANDAGES/DRESSINGS) ×3 IMPLANT
BLADE HEX COATED 2.75 (ELECTRODE) ×3 IMPLANT
BLADE SURG 10 STRL SS (BLADE) IMPLANT
BLADE SURG 11 STRL SS (BLADE) ×3 IMPLANT
BLADE SURG 15 STRL LF DISP TIS (BLADE) ×2 IMPLANT
BLADE SURG 15 STRL SS (BLADE) ×3
CANISTER SUC SOCK COL 7IN (MISCELLANEOUS) IMPLANT
CANISTER SUCT 1200ML W/VALVE (MISCELLANEOUS) IMPLANT
CHLORAPREP W/TINT 26 (MISCELLANEOUS) ×6 IMPLANT
CLEANER CAUTERY TIP 5X5 PAD (MISCELLANEOUS) IMPLANT
CLIP APPLIE 9.375 MED OPEN (MISCELLANEOUS) ×2 IMPLANT
COVER BACK TABLE 60X90IN (DRAPES) ×3 IMPLANT
COVER MAYO STAND STRL (DRAPES) ×3 IMPLANT
COVER PROBE 5X48 (MISCELLANEOUS) ×3
COVER PROBE W GEL 5X96 (DRAPES) ×3 IMPLANT
COVER WAND RF STERILE (DRAPES) IMPLANT
DECANTER SPIKE VIAL GLASS SM (MISCELLANEOUS) IMPLANT
DRAPE C-ARM 42X72 X-RAY (DRAPES) ×3 IMPLANT
DRAPE LAPAROSCOPIC ABDOMINAL (DRAPES) ×3 IMPLANT
DRAPE LAPAROTOMY TRNSV 102X78 (DRAPES) ×3 IMPLANT
DRAPE UTILITY XL STRL (DRAPES) ×6 IMPLANT
DRSG TEGADERM 2-3/8X2-3/4 SM (GAUZE/BANDAGES/DRESSINGS) ×3 IMPLANT
DRSG TEGADERM 4X4.75 (GAUZE/BANDAGES/DRESSINGS) ×6 IMPLANT
ELECT REM PT RETURN 9FT ADLT (ELECTROSURGICAL) ×3
ELECTRODE REM PT RTRN 9FT ADLT (ELECTROSURGICAL) ×2 IMPLANT
GAUZE SPONGE 4X4 12PLY STRL LF (GAUZE/BANDAGES/DRESSINGS) ×3 IMPLANT
GLOVE BIO SURGEON STRL SZ 6.5 (GLOVE) ×3 IMPLANT
GLOVE BIO SURGEON STRL SZ7 (GLOVE) ×6 IMPLANT
GLOVE BIOGEL PI IND STRL 7.5 (GLOVE) ×4 IMPLANT
GLOVE BIOGEL PI INDICATOR 7.5 (GLOVE) ×2
GOWN STRL REUS W/ TWL LRG LVL3 (GOWN DISPOSABLE) ×6 IMPLANT
GOWN STRL REUS W/TWL LRG LVL3 (GOWN DISPOSABLE) ×9
ILLUMINATOR WAVEGUIDE N/F (MISCELLANEOUS) IMPLANT
IV KIT MINILOC 20X1 SAFETY (NEEDLE) IMPLANT
KIT CVR 48X5XPRB PLUP LF (MISCELLANEOUS) ×2 IMPLANT
KIT MARKER MARGIN INK (KITS) ×3 IMPLANT
KIT PORT POWER 8FR ISP CVUE (Port) ×3 IMPLANT
LIGHT WAVEGUIDE WIDE FLAT (MISCELLANEOUS) IMPLANT
NDL SAFETY ECLIPSE 18X1.5 (NEEDLE) IMPLANT
NEEDLE HYPO 18GX1.5 SHARP (NEEDLE)
NEEDLE HYPO 25X1 1.5 SAFETY (NEEDLE) ×3 IMPLANT
NEEDLE SPNL 22GX3.5 QUINCKE BK (NEEDLE) IMPLANT
NS IRRIG 1000ML POUR BTL (IV SOLUTION) ×3 IMPLANT
PACK BASIN DAY SURGERY FS (CUSTOM PROCEDURE TRAY) ×3 IMPLANT
PAD CLEANER CAUTERY TIP 5X5 (MISCELLANEOUS)
PENCIL SMOKE EVACUATOR (MISCELLANEOUS) ×3 IMPLANT
SLEEVE SCD COMPRESS KNEE MED (MISCELLANEOUS) ×3 IMPLANT
SPONGE GAUZE 2X2 8PLY STRL LF (GAUZE/BANDAGES/DRESSINGS) IMPLANT
SPONGE LAP 18X18 RF (DISPOSABLE) IMPLANT
SPONGE LAP 4X18 RFD (DISPOSABLE) ×9 IMPLANT
STRIP CLOSURE SKIN 1/2X4 (GAUZE/BANDAGES/DRESSINGS) ×3 IMPLANT
SUT MON AB 4-0 PC3 18 (SUTURE) ×9 IMPLANT
SUT PROLENE 2 0 CT2 30 (SUTURE) ×3 IMPLANT
SUT SILK 2 0 SH (SUTURE) IMPLANT
SUT VIC AB 3-0 SH 27 (SUTURE) ×9
SUT VIC AB 3-0 SH 27X BRD (SUTURE) ×6 IMPLANT
SYR 5ML LUER SLIP (SYRINGE) ×3 IMPLANT
SYR BULB EAR ULCER 3OZ GRN STR (SYRINGE) ×3 IMPLANT
SYR CONTROL 10ML LL (SYRINGE) ×6 IMPLANT
TOWEL GREEN STERILE FF (TOWEL DISPOSABLE) ×3 IMPLANT
TRAY FAXITRON CT DISP (TRAY / TRAY PROCEDURE) ×3 IMPLANT
TUBE CONNECTING 20X1/4 (TUBING) ×3 IMPLANT
YANKAUER SUCT BULB TIP NO VENT (SUCTIONS) ×3 IMPLANT

## 2020-07-14 NOTE — Progress Notes (Signed)
Assisted Dr. Green with right, ultrasound guided, pectoralis block. Side rails up, monitors on throughout procedure. See vital signs in flow sheet. Tolerated Procedure well. 

## 2020-07-14 NOTE — Anesthesia Preprocedure Evaluation (Signed)
Anesthesia Evaluation  Patient identified by MRN, date of birth, ID band Patient awake    Reviewed: Allergy & Precautions, NPO status , Patient's Chart, lab work & pertinent test results  Airway Mallampati: II  TM Distance: >3 FB     Dental   Pulmonary    breath sounds clear to auscultation       Cardiovascular negative cardio ROS   Rhythm:Regular     Neuro/Psych    GI/Hepatic negative GI ROS, Neg liver ROS,   Endo/Other  negative endocrine ROS  Renal/GU negative Renal ROS     Musculoskeletal   Abdominal   Peds  Hematology negative hematology ROS (+)   Anesthesia Other Findings   Reproductive/Obstetrics                             Anesthesia Physical Anesthesia Plan  ASA: II  Anesthesia Plan: General   Post-op Pain Management:  Regional for Post-op pain   Induction: Intravenous  PONV Risk Score and Plan: 3 and Ondansetron, Dexamethasone and Midazolam  Airway Management Planned: LMA  Additional Equipment:   Intra-op Plan:   Post-operative Plan: Extubation in OR  Informed Consent: I have reviewed the patients History and Physical, chart, labs and discussed the procedure including the risks, benefits and alternatives for the proposed anesthesia with the patient or authorized representative who has indicated his/her understanding and acceptance.     Dental advisory given  Plan Discussed with: CRNA and Anesthesiologist  Anesthesia Plan Comments:         Anesthesia Quick Evaluation

## 2020-07-14 NOTE — Anesthesia Procedure Notes (Addendum)
Anesthesia Regional Block: Pectoralis block   Pre-Anesthetic Checklist: ,, timeout performed, Correct Patient, Correct Site, Correct Laterality, Correct Procedure, Correct Position, site marked, Risks and benefits discussed,  Surgical consent,  Pre-op evaluation,  At surgeon's request and post-op pain management  Laterality: Right  Prep: chloraprep       Needles:  Injection technique: Single-shot  Needle Type: Stimulator Needle - 40          Additional Needles:   Procedures: Doppler guided,,,, ultrasound used (permanent image in chart),,,,  Narrative:  Start time: 07/14/2020 11:55 AM End time: 07/14/2020 12:10 PM Injection made incrementally with aspirations every 5 mL.  Performed by: Personally  Anesthesiologist: Belinda Block, MD

## 2020-07-14 NOTE — Discharge Instructions (Signed)
[place] Office Phone Number [phone]  BREAST BIOPSY/ PARTIAL MASTECTOMY: POST OP INSTRUCTIONS  Always review your discharge instruction sheet given to you by the facility where your surgery was performed.  IF YOU HAVE DISABILITY OR FAMILY LEAVE FORMS, YOU MUST BRING THEM TO THE OFFICE FOR PROCESSING.  DO NOT GIVE THEM TO YOUR DOCTOR.  1. A prescription for pain medication may be given to you upon discharge.  Take your pain medication as prescribed, if needed.  If narcotic pain medicine is not needed, then you may take acetaminophen (Tylenol) or ibuprofen (Advil) as needed. No Tylenol until 4:00pm if needed. 2. Take your usually prescribed medications unless otherwise directed 3. If you need a refill on your pain medication, please contact your pharmacy.  They will contact our office to request authorization.  Prescriptions will not be filled after 5pm or on week-ends. 4. You should eat very light the first 24 hours after surgery, such as soup, crackers, pudding, etc.  Resume your normal diet the day after surgery. 5. Most patients will experience some swelling and bruising in the breast.  Ice packs and a good support bra will help.  Swelling and bruising can take several days to resolve.  6. It is common to experience some constipation if taking pain medication after surgery.  Increasing fluid intake and taking a stool softener will usually help or prevent this problem from occurring.  A mild laxative (Milk of Magnesia or Miralax) should be taken according to package directions if there are no bowel movements after 48 hours. 7. Unless discharge instructions indicate otherwise, you may remove your bandages 24-48 hours after surgery, and you may shower at that time.  You may have steri-strips (small skin tapes) in place directly over the incision.  These strips should be left on the skin for 7-10 days.  If your surgeon used skin glue on the incision, you may shower in 24 hours.   The glue will flake off over the next 2-3 weeks.  Any sutures or staples will be removed at the office during your follow-up visit. 8. ACTIVITIES:  You may resume regular daily activities (gradually increasing) beginning the next day.  Wearing a good support bra or sports bra minimizes pain and swelling.  You may have sexual intercourse when it is comfortable. a. You may drive when you no longer are taking prescription pain medication, you can comfortably wear a seatbelt, and you can safely maneuver your car and apply brakes. b. RETURN TO WORK:  ______________________________________________________________________________________ 9. You should see your doctor in the office for a follow-up appointment approximately two weeks after your surgery.  Your doctor's nurse will typically make your follow-up appointment when she calls you with your pathology report.  Expect your pathology report 2-3 business days after your surgery.  You may call to check if you do not hear from [place] after three days. 10. OTHER INSTRUCTIONS: _______________________________________________________________________________________________ _____________________________________________________________________________________________________________________________________ _____________________________________________________________________________________________________________________________________ _____________________________________________________________________________________________________________________________________  WHEN TO CALL YOUR DOCTOR: 1. Fever over 101.0 2. Nausea and/or vomiting. 3. Extreme swelling or bruising. 4. Continued bleeding from incision. 5. Increased pain, redness, or drainage from the incision.  The clinic staff is available to answer your questions during regular business hours.  Please don't hesitate to call and ask to speak to one of the nurses for clinical concerns.  If you have a medical  emergency, go to the nearest emergency room or call 911.  A surgeon from [facility] is always on call at the hospital.  For  further questions, please visit [email or web address]       PORT-A-CATH: POST OP INSTRUCTIONS  Always review your discharge instruction sheet given to you by the facility where your surgery was performed.   1. A prescription for pain medication may be given to you upon discharge. Take your pain medication as prescribed, if needed. If narcotic pain medicine is not needed, then you make take acetaminophen (Tylenol) or ibuprofen (Advil) as needed.  2. Take your usually prescribed medications unless otherwise directed. 3. If you need a refill on your pain medication, please contact our office. All narcotic pain medicine now requires a paper prescription.  Phoned in and fax refills are no longer allowed by law.  Prescriptions will not be filled after 5 pm or on weekends.  4. You should follow a light diet for the remainder of the day after your procedure. 5. Most patients will experience some mild swelling and/or bruising in the area of the incision. It may take several days to resolve. 6. It is common to experience some constipation if taking pain medication after surgery. Increasing fluid intake and taking a stool softener (such as Colace) will usually help or prevent this problem from occurring. A mild laxative (Milk of Magnesia or Miralax) should be taken according to package directions if there are no bowel movements after 48 hours.  7. Unless discharge instructions indicate otherwise, you may remove your bandages 48 hours after surgery, and you may shower at that time. You may have steri-strips (small white skin tapes) in place directly over the incision.  These strips should be left on the skin for 7-10 days.  If your surgeon used Dermabond (skin glue) on the incision, you may shower in 24 hours.  The glue will flake off over the next 2-3 weeks.  8. If  your port is left accessed at the end of surgery (needle left in port), the dressing cannot get wet and should only by changed by a healthcare professional. When the port is no longer accessed (when the needle has been removed), follow step 7.   9. ACTIVITIES:  Limit activity involving your arms for the next 72 hours. Do no strenuous exercise or activity for 1 week. You may drive when you are no longer taking prescription pain medication, you can comfortably wear a seatbelt, and you can maneuver your car. 10.You may need to see your doctor in the office for a follow-up appointment.  Please       check with your doctor.  11.When you receive a new Port-a-Cath, you will get a product guide and        ID card.  Please keep them in case you need them.  WHEN TO CALL YOUR DOCTOR [phone]): 1. Fever over 101.0 2. Chills 3. Continued bleeding from incision 4. Increased redness and tenderness at the site 5. Shortness of breath, difficulty breathing   The clinic staff is available to answer your questions during regular business hours. Please don't hesitate to call and ask to speak to one of the nurses or medical assistants for clinical concerns. If you have a medical emergency, go to the nearest emergency room or call 911.  A surgeon from [facility] is always on call at the hospital.     For further information, please visit [email or web address]      Post Anesthesia Home Care Instructions  Activity: Get plenty of rest for the remainder of the day. A responsible individual must stay with you for 24 hours following the  procedure.  For the next 24 hours, DO NOT: -Drive a car -Paediatric nurse -Drink alcoholic beverages -Take any medication unless instructed by your physician -Make any legal decisions or sign important papers.  Meals: Start with liquid foods such as gelatin or soup. Progress to regular foods as tolerated. Avoid greasy, spicy, heavy foods. If  nausea and/or vomiting occur, drink only clear liquids until the nausea and/or vomiting subsides. Call your physician if vomiting continues.  Special Instructions/Symptoms: Your throat may feel dry or sore from the anesthesia or the breathing tube placed in your throat during surgery. If this causes discomfort, gargle with warm salt water. The discomfort should disappear within 24 hours.  If you had a scopolamine patch placed behind your ear for the management of post- operative nausea and/or vomiting:  1. The medication in the patch is effective for 72 hours, after which it should be removed.  Wrap patch in a tissue and discard in the trash. Wash hands thoroughly with soap and water. 2. You may remove the patch earlier than 72 hours if you experience unpleasant side effects which may include dry mouth, dizziness or visual disturbances. 3. Avoid touching the patch. Wash your hands with soap and water after contact with the patch.

## 2020-07-14 NOTE — Interval H&P Note (Signed)
History and Physical Interval Note:  07/14/2020 9:31 AM  Autumn Bullock  has presented today for surgery, with the diagnosis of RIGHT INTRADUCTAL CARCINOMA.  The various methods of treatment have been discussed with the patient and family. After consideration of risks, benefits and other options for treatment, the patient has consented to  Procedure(s) with comments: RIGHT BREAST LUMPECTOMY WITH BRACKETED RADIOACTIVE SEED AND SENTINEL LYMPH NODE BIOPSY, BLUE DYE INJECTION (Right) - PEC BLOCK, BLUE DYE INJECTION INSERTION PORT-A-CATH WITH ULTRASOUND GUIDANCE (N/A) as a surgical intervention.  The patient's history has been reviewed, patient examined, no change in status, stable for surgery.  I have reviewed the patient's chart and labs.  Questions were answered to the patient's satisfaction.     Maia Petties

## 2020-07-14 NOTE — Anesthesia Procedure Notes (Addendum)
Procedure Name: Intubation Date/Time: 07/14/2020 11:51 AM Performed by: Lavonia Dana, CRNA Pre-anesthesia Checklist: Patient identified, Emergency Drugs available, Suction available and Patient being monitored Patient Re-evaluated:Patient Re-evaluated prior to induction Oxygen Delivery Method: Circle system utilized Preoxygenation: Pre-oxygenation with 100% oxygen Induction Type: IV induction Ventilation: Mask ventilation without difficulty Laryngoscope Size: Mac and 3 Grade View: Grade I Tube type: Oral Tube size: 7.0 mm Number of attempts: 1 Airway Equipment and Method: Stylet and Bite block Placement Confirmation: ETT inserted through vocal cords under direct vision,  positive ETCO2 and breath sounds checked- equal and bilateral Secured at: 21 cm Tube secured with: Tape Dental Injury: Teeth and Oropharynx as per pre-operative assessment

## 2020-07-14 NOTE — Transfer of Care (Signed)
Immediate Anesthesia Transfer of Care Note  Patient: Autumn Bullock  Procedure(s) Performed: RIGHT BREAST LUMPECTOMY WITH BRACKETED RADIOACTIVE SEED AND SENTINEL LYMPH NODE BIOPSY, BLUE DYE INJECTION (Right Breast) INSERTION PORT-A-CATH WITH ULTRASOUND GUIDANCE (Right Chest)  Patient Location: PACU  Anesthesia Type:GA combined with regional for post-op pain  Level of Consciousness: awake, alert  and oriented  Airway & Oxygen Therapy: Patient Spontanous Breathing and Patient connected to face mask oxygen  Post-op Assessment: Report given to RN and Post -op Vital signs reviewed and stable  Post vital signs: Reviewed and stable  Last Vitals:  Vitals Value Taken Time  BP 130/76 07/14/20 1416  Temp    Pulse 101 07/14/20 1419  Resp 13 07/14/20 1419  SpO2 100 % 07/14/20 1419  Vitals shown include unvalidated device data.  Last Pain:  Vitals:   07/14/20 0948  TempSrc: Oral  PainSc: 0-No pain      Patients Stated Pain Goal: 4 (46/04/79 [id number])  Complications: No complications documented.

## 2020-07-14 NOTE — Op Note (Signed)
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 She underwent further evaluation with mammogram and [place] that revealed a mass at 11:30 in the right breast 2.3 x 1.5 x 1.7 cm 3 cmfn. Just superficial to this area, there is a second 0.8 x 0.6 x 0.7 cm mass. 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  She presents now for bracketed lumpectomy/ SLNB/ and port placement for chemotherapy.   Description of procedure: The patient is brought to the operating room placed in the supine position on the operating table.  After an adequate level of general anesthesia was obtained, the patient right arm was tucked at her side.  Her right chest and neck were prepped with ChloraPrep and draped sterile fashion.  A timeout was taken to ensure the proper patient and proper procedure.  She was placed in Trendelenburg position.  We interrogated her neck with the ultrasound.  The jugular vein is easily identified.  Using ultrasound guidance we directly cannulated the internal jugular vein with good blood return.  The wire passed easily.  Fluoroscopy confirmed that the wire headed down the right side of the mediastinum.  The needle was removed.  We created a subcutaneous pocket below the right clavicle.  We first anesthetized with local anesthetic.  We created a subcutaneous tunnel from the subcutaneous pocket to the insertion site on the neck.  An 8 French [place] port was assembled and was tunneled from the subcutaneous pocket to the insertion site.  The  catheter was cut to the appropriate length using fluoroscopic guidance.  Using fluoroscopic guidance, we passed the dilator and breakaway sheath over the wire.  The wire and dilator were removed.  The catheter was then advanced through the sheath which was removed.  Fluoroscopy confirmed that there were no kinks along the length of the catheter.  We are able to aspirate blood easily through the port and were able to flush easily.  The port was secured with two interrupted 2-0 Prolene sutures.  3-0 Vicryl was used to close the subcutaneous tissue and 4-0 Monocryl was used to close the skin at both sites.  Benzoin and Steri-Strips were applied.  An occlusive dressing was placed.     We turned our attention to the right breast.  I injected some methylene blue dye solution around the right nipple.  Her right breast was prepped with ChloraPrep and draped in sterile fashion. A timeout was taken to ensure the proper patient and proper procedure. We interrogated the breast with the neoprobe. We made a circumareolar incision around the lateral side of the nipple after infiltrating with 0.25% Marcaine. Dissection was carried down in the breast tissue with cautery. We used the neoprobe to guide [place] towards the radioactive seed. We excised an area of tissue around the radioactive seed 3 cm in diameter. The specimen was removed and was oriented with a paint kit. Specimen mammogram showed both radioactive seed as well as the biopsy clips within the specimen. This was sent for pathologic examination. There is no residual radioactivity within the biopsy cavity. We inspected carefully for hemostasis. The wound was thoroughly  irrigated. The wound was closed with a deep layer of 3-0 Vicryl and a subcuticular layer of 4-0 Monocryl.   We turned our attention to the right axilla.  I interrogated the axilla with the neoprobe.  We detected an area of activity.  I made a transverse incision across the axilla.  We dissected up into the  axillary contents.  I was able to identify a single hot blue lymph node.  This was removed and sent for pathologic examination.  There was minimal background radioactivity.  A small clip was used for hemostasis.  The wound was then irrigated and again inspected for hemostasis.  We closed with a deep layer of 3-0 Vicryl and a subcuticular layer 4-0 Monocryl.  Benzoin Steri-Strips were applied to both incisions.  Dry dressings were applied.. The patient was then extubated and brought to the recovery room in stable condition. All sponge, instrument, and needle counts are correct.  Imogene Burn. Georgette Dover, MD, [facility]  General/ Trauma Surgery  07/14/2020 2:17 PM

## 2020-07-14 NOTE — Anesthesia Postprocedure Evaluation (Signed)
Anesthesia Post Note  Patient: Autumn Bullock  Procedure(s) Performed: RIGHT BREAST LUMPECTOMY WITH BRACKETED RADIOACTIVE SEED AND SENTINEL LYMPH NODE BIOPSY, BLUE DYE INJECTION (Right Breast) INSERTION PORT-A-CATH WITH ULTRASOUND GUIDANCE (Right Chest)     Patient location during evaluation: PACU Anesthesia Type: General Level of consciousness: awake Pain management: pain level controlled Vital Signs Assessment: post-procedure vital signs reviewed and stable Respiratory status: spontaneous breathing Cardiovascular status: stable Postop Assessment: no apparent nausea or vomiting Anesthetic complications: no   No complications documented.  Last Vitals:  Vitals:   07/14/20 1445 07/14/20 1500  BP: 130/87 127/68  Pulse: 84 92  Resp: 14 16  Temp:  36.8 C  SpO2: 100% 100%    Last Pain:  Vitals:   07/14/20 1500  TempSrc:   PainSc: 3                  Kameria Canizares

## 2020-07-15 ENCOUNTER — Encounter (HOSPITAL_BASED_OUTPATIENT_CLINIC_OR_DEPARTMENT_OTHER): Payer: Self-pay | Admitting: Surgery

## 2020-07-16 ENCOUNTER — Encounter: Payer: Self-pay | Admitting: Oncology

## 2020-07-17 ENCOUNTER — Encounter: Payer: Self-pay | Admitting: Oncology

## 2020-07-20 ENCOUNTER — Ambulatory Visit: Payer: Self-pay | Admitting: Surgery

## 2020-07-20 ENCOUNTER — Telehealth: Payer: Self-pay | Admitting: *Deleted

## 2020-07-20 LAB — SURGICAL PATHOLOGY

## 2020-07-20 NOTE — Telephone Encounter (Signed)
Pt called requesting update on her FMLA paperwork. Msg sent to [facility] specialist for an update Confirmed chemo class and f/u with Dr. Jana Hakim on 11/19. Encourage pt to call with needs. Received verbal understanding.

## 2020-07-21 ENCOUNTER — Encounter (HOSPITAL_BASED_OUTPATIENT_CLINIC_OR_DEPARTMENT_OTHER): Payer: Self-pay | Admitting: Surgery

## 2020-07-21 ENCOUNTER — Other Ambulatory Visit: Payer: Self-pay

## 2020-07-21 ENCOUNTER — Telehealth: Payer: Self-pay | Admitting: *Deleted

## 2020-07-21 NOTE — Telephone Encounter (Signed)
Spoke to pt, informed FMLA paperwork at from desk of [facility] to pick up. Received verbal understanding.

## 2020-07-22 ENCOUNTER — Encounter: Payer: Self-pay | Admitting: *Deleted

## 2020-07-23 ENCOUNTER — Inpatient Hospital Stay: Payer: BC Managed Care – PPO | Admitting: Oncology

## 2020-07-23 ENCOUNTER — Telehealth: Payer: Self-pay | Admitting: Oncology

## 2020-07-23 ENCOUNTER — Inpatient Hospital Stay: Payer: BC Managed Care – PPO | Attending: Oncology

## 2020-07-23 ENCOUNTER — Encounter: Payer: Self-pay | Admitting: Oncology

## 2020-07-23 ENCOUNTER — Other Ambulatory Visit: Payer: Self-pay

## 2020-07-23 VITALS — BP 121/79 | HR 96 | Temp 97.8°F | Resp 18 | Ht 65.0 in | Wt 157.2 lb

## 2020-07-23 DIAGNOSIS — C50411 Malignant neoplasm of upper-outer quadrant of right female breast: Secondary | ICD-10-CM | POA: Diagnosis not present

## 2020-07-23 DIAGNOSIS — Z170 Estrogen receptor positive status [ER+]: Secondary | ICD-10-CM | POA: Diagnosis not present

## 2020-07-23 MED ORDER — KETOCONAZOLE 2 % EX CREA: 1.0000 "application " | TOPICAL_CREAM | Freq: Every day | CUTANEOUS | 0 refills | Status: DC

## 2020-07-23 NOTE — Telephone Encounter (Signed)
Released records to [place] center to [id number]    Release:  [id number]

## 2020-07-23 NOTE — Progress Notes (Signed)
[place]  Telephone:([phone]) [phone] Fax:(336) [phone]     ID: Autumn Bullock DOB: October 15, 1975  MR#: [id number]  DJT#:[id number]  Patient Care Team: Brock Ra, PA-C as PCP - General Mauro Kaufmann, RN as Oncology Nurse Navigator Rockwell Germany, RN as Oncology Nurse Navigator Donnie Mesa, MD as Consulting Physician (General Surgery) Eppie Gibson, MD as Attending Physician (Radiation Oncology) Cyana Shook, Virgie Dad, MD as Consulting Physician (Oncology) Vanessa Kick, MD as Consulting Physician (Obstetrics and Gynecology) Lovett Calender, MD as Consulting Physician (Orthopedic Surgery) Chauncey Cruel, MD OTHER MD:  CHIEF COMPLAINT: Estrogen receptor positive breast cancer  CURRENT TREATMENT: Awaiting further surgery   INTERVAL HISTORY: Autumn Bullock returns today for follow up of her estrogen receptor positive breast cancer accompanied by her husband.   Since her last visit, she underwent right lumpectomy on 07/14/2020 under Dr. Georgette Dover. 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  The biopsied lymph node showed micrometastasis.  She is scheduled for re-excision of the positive margin on 08/03/2020.   REVIEW OF SYSTEMS: Autumn Bullock did generally well with the surgery.  She had a very hard time with the anesthesia and vomited quite a bit.  She had many questions of course regarding the positive margin and restaging issues.  She was very uncomfortable with the port at first but is learning to live with it.  She tells me her right breast is a bit swollen and there is a little bit of a rash in between the breasts at present.  Aside from this a detailed review of systems today was stable   COVID 19 VACCINATION STATUS: Status post [place] x2, most recent treatment April 2021.  Patient also  was diagnosed with COVID-19 disease November 2020   HISTORY OF CURRENT ILLNESS: From the original intake note:  Autumn Bullock had routine screening mammography on 06/02/2020 showing a possible abnormality in the right breast. She underwent right diagnostic mammography with tomography and right breast ultrasonography at [facility] on 06/08/2020 showing: breast density category C; 2.3 cm right breast mass at 11:30; 0.8 cm mass superficial to dominant mass; no right axillary adenopathy.  Accordingly on 06/08/2020 she proceeded to biopsy of the right breast area in question. The pathology from this procedure ([id number]) showed: invasive and in situ mammary carcinoma, grade 2, e-cadherin positive. Both biopsied masses showed this and were found to be morphologically similar. Prognostic indicators significant for: estrogen receptor, 90% positive and progesterone receptor, 70% positive, both with strong staining intensity. Proliferation marker Ki67 at 5%. HER2 negative by immunohistochemistry (1+).  The patient's subsequent history is as detailed below.   PAST MEDICAL HISTORY: Past Medical History:  Diagnosis Date  . Family history of uterine cancer 06/16/2020  . PONV (postoperative nausea and vomiting)     PAST SURGICAL HISTORY: Past Surgical History:  Procedure Laterality Date  . BREAST LUMPECTOMY WITH RADIOACTIVE SEED AND SENTINEL LYMPH NODE BIOPSY Right 07/14/2020   Procedure: RIGHT BREAST LUMPECTOMY WITH BRACKETED RADIOACTIVE SEED AND SENTINEL LYMPH NODE BIOPSY, BLUE DYE INJECTION;  Surgeon: Donnie Mesa, MD;  Location: [facility];  Service: General;  Laterality: Right;  PEC BLOCK, BLUE DYE INJECTION  . CESAREAN SECTION     2011  . PORTACATH PLACEMENT Right 07/14/2020   Procedure: INSERTION PORT-A-CATH WITH ULTRASOUND GUIDANCE;  Surgeon: Donnie Mesa, MD;  Location: [facility];  Service: General;  Laterality:  Right;    FAMILY HISTORY: Family History    Problem Relation Age of Onset  . Cancer Maternal Grandmother 87       Unknown GYN.  ?Uterine  As of October 2021 her parents are both living, her father at age 39 and her mother at 64, . Kymorah has 1 brother and 2 sisters. She reports uterine cancer in her maternal grandmother at age 10. There is no family history of breast, ovarian, or colon cancer to her knowledge.   GYNECOLOGIC HISTORY:  Patient's last menstrual period was 06/29/2020. Menarche: 44 years old Age at first live birth: 44 years old [place] P 1 LMP 05/05/2020, regular monthly periods, lasting 3 days with 1-2 heavy days Contraceptive: has used for 15 years, no complications HRT n/a  Hysterectomy? no BSO? 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 Husband Autumn Bullock works in Nature conservation officer. She lives at home with Autumn Bullock and their daughter Autumn Bullock, age 41. She attends [employer] in [place].    ADVANCED DIRECTIVES: In the absence of any documentation to the contrary, the patient's spouse is their HCPOA.    HEALTH MAINTENANCE: Social History   Tobacco Use  . Smoking status: Never Smoker  . Smokeless tobacco: Never Used  Substance Use Topics  . Alcohol use: Yes    Comment: 7/week  . Drug use: Never     Colonoscopy: 1999?  PAP: 05/31/2020  Bone density: never done   Allergies  Allergen Reactions  . Wound Dressing Adhesive     Tape etc     Current Outpatient Medications  Medication Sig Dispense Refill  . dexamethasone (DECADRON) 4 MG tablet Take 2 tablets (8 mg total) by mouth daily. Take daily for 3 days after chemo. Take with food. 30 tablet 1  . lidocaine-prilocaine (EMLA) cream Apply to affected area once 30 g 3  . LORazepam (ATIVAN) 0.5 MG tablet Take 1 tablet (0.5 mg total) by mouth at bedtime as needed (Nausea or vomiting). 30 tablet 0  . oxyCODONE (OXY IR/ROXICODONE) 5 MG immediate release tablet Take 1 tablet (5 mg total) by mouth  every 6 (six) hours as needed for severe pain. 20 tablet 0  . prochlorperazine (COMPAZINE) 10 MG tablet Take 1 tablet (10 mg total) by mouth every 6 (six) hours as needed (Nausea or vomiting). 30 tablet 1   No current facility-administered medications for this visit.    OBJECTIVE: White woman who appears well  There were no vitals filed for this visit.   There is no height or weight on file to calculate BMI.   Wt Readings from Last 3 Encounters:  07/21/20 152 lb 1.9 oz (69 kg)  07/14/20 160 lb 7.9 oz (72.8 kg)  07/02/20 158 lb 8 oz (71.9 kg)      ECOG FS:0 - Asymptomatic  Sclerae unicteric, EOMs intact Wearing a mask No cervical or supraclavicular adenopathy Lungs no rales or rhonchi Heart regular rate and rhythm Abd soft, nontender, positive bowel sounds MSK no focal spinal tenderness, no upper extremity lymphedema Neuro: nonfocal, well oriented, appropriate affect Breasts: Right breast is status post lumpectomy.  The incisions are healing nicely.  The breast is slightly swollen but not erythematous.  There is no dehiscence.  Both axillae are benign.   LAB RESULTS:  CMP     Component Value Date/Time   NA 140 06/16/2020 0819   K 4.1 06/16/2020 0819   CL 104 06/16/2020 0819   CO2  30 06/16/2020 0819   GLUCOSE 123 (H) 06/16/2020 0819   BUN 15 06/16/2020 0819   CREATININE 0.83 06/16/2020 0819   CALCIUM 9.5 06/16/2020 0819   PROT 7.6 06/16/2020 0819   ALBUMIN 4.4 06/16/2020 0819   AST 18 06/16/2020 0819   ALT 15 06/16/2020 0819   ALKPHOS 64 06/16/2020 0819   BILITOT 0.4 06/16/2020 0819   GFRNONAA >60 06/16/2020 0819    No results found for: TOTALPROTELP, ALBUMINELP, A1GS, A2GS, BETS, BETA2SER, GAMS, MSPIKE, SPEI  Lab Results  Component Value Date   WBC 6.0 06/16/2020   NEUTROABS 4.1 06/16/2020   HGB 13.5 06/16/2020   HCT 39.5 06/16/2020   MCV 96.6 06/16/2020   PLT 263 06/16/2020    No results found for: LABCA2  No components found for: GBTDVV616  No  results for input(s): INR in the last 168 hours.  No results found for: LABCA2  No results found for: WVP710  No results found for: GYI948  No results found for: NIO270  No results found for: CA2729  No components found for: HGQUANT  No results found for: CEA1 / No results found for: CEA1   No results found for: AFPTUMOR  No results found for: CHROMOGRNA  No results found for: KPAFRELGTCHN, LAMBDASER, KAPLAMBRATIO (kappa/lambda light chains)  No results found for: HGBA, HGBA2QUANT, HGBFQUANT, HGBSQUAN (Hemoglobinopathy evaluation)   No results found for: LDH  No results found for: IRON, TIBC, IRONPCTSAT (Iron and TIBC)  No results found for: FERRITIN  Urinalysis No results found for: COLORURINE, APPEARANCEUR, LABSPEC, PHURINE, GLUCOSEU, HGBUR, BILIRUBINUR, KETONESUR, PROTEINUR, UROBILINOGEN, NITRITE, LEUKOCYTESUR   STUDIES: NM Sentinel Node Inj-No Rpt (Breast)  Result Date: 07/14/2020 Sulfur colloid was injected by the nuclear medicine technologist for melanoma sentinel node.   MM Breast Surgical Specimen  Result Date: 07/14/2020 CLINICAL DATA:  Bracketed right lumpectomy for multifocal invasive ductal carcinoma. EXAM: SPECIMEN RADIOGRAPH OF THE RIGHT BREAST COMPARISON:  Previous exam(s). FINDINGS: Status post excision of the right breast. Two radioactive seeds, ribbon shaped biopsy marker clip and coil shaped biopsy marker clip are present, completely intact. IMPRESSION: Specimen radiograph of the right breast. Electronically Signed   By: Claudie Revering M.D.   On: 07/14/2020 13:37   DG CHEST PORT 1 VIEW  Result Date: 07/14/2020 CLINICAL DATA:  Port-A-Cath insertion EXAM: PORTABLE CHEST 1 VIEW COMPARISON:  Portable exam 1416 hours compared to 08/13/2019 FINDINGS: RIGHT jugular Port-A-Cath with tip projecting over SVC near cavoatrial junction. Normal heart size, mediastinal contours, and pulmonary vascularity. Lungs clear. No acute infiltrate, pleural effusion, or  pneumothorax. Surgical clips and air within RIGHT breast consistent with interval surgery. IMPRESSION: No pneumothorax following RIGHT jugular Port-A-Cath insertion. Postsurgical changes RIGHT breast. Electronically Signed   By: Lavonia Dana M.D.   On: 07/14/2020 14:33   DG Fluoro Guide CV Line-No Report  Result Date: 07/14/2020 Fluoroscopy was utilized by the requesting physician.  No radiographic interpretation.   ECHOCARDIOGRAM COMPLETE  Result Date: 07/09/2020    ECHOCARDIOGRAM REPORT   Patient Name:   Kezia RENEE Lavell Islam Date of Exam: 07/09/2020 Medical Rec #:  [id number]            Height:       65.0 in Accession #:    [id number]           Weight:       153.0 lb Date of Birth:  04/24/1976             BSA:  1.765 m Patient Age:    44 years             BP:           126/66 mmHg Patient Gender: F                    HR:           70 bpm. Exam Location:  [address] Procedure: 2D Echo, 3D Echo, Cardiac Doppler, Color Doppler and Strain Analysis Indications:    Z09 Chemo  History:        Patient has no prior history of Echocardiogram examinations.                 Neoplasm of right breast.  Sonographer:    NaTashia Rodgers-Jones RDCS Referring Phys: [place]  1. Left ventricular ejection fraction, by estimation, is 60 to 65%. The left ventricle has normal function. The left ventricle has no regional wall motion abnormalities. Left ventricular diastolic parameters were normal. The average left ventricular global longitudinal strain is -29.3 %. The global longitudinal strain is normal.  2. Right ventricular systolic function is normal. The right ventricular size is normal.  3. The mitral valve is normal in structure. Trivial mitral valve regurgitation.  4. The aortic valve is tricuspid. There is mild thickening of the aortic valve. Aortic valve regurgitation is not visualized.  5. The inferior vena cava is normal in size with greater than 50% respiratory variability, suggesting  right atrial pressure of 3 mmHg. Comparison(s): No prior Echocardiogram. FINDINGS  Left Ventricle: Left ventricular ejection fraction, by estimation, is 60 to 65%. The left ventricle has normal function. The left ventricle has no regional wall motion abnormalities. The average left ventricular global longitudinal strain is -29.3 %. The global longitudinal strain is normal. The left ventricular internal cavity size was normal in size. There is no left ventricular hypertrophy. Left ventricular diastolic parameters were normal. Right Ventricle: The right ventricular size is normal. No increase in right ventricular wall thickness. Right ventricular systolic function is normal. Left Atrium: Left atrial size was normal in size. Right Atrium: Right atrial size was normal in size. Pericardium: There is no evidence of pericardial effusion. Mitral Valve: The mitral valve is normal in structure. There is mild thickening of the mitral valve leaflet(s). Trivial mitral valve regurgitation. Tricuspid Valve: The tricuspid valve is normal in structure. Tricuspid valve regurgitation is mild. Aortic Valve: The aortic valve is tricuspid. There is mild thickening of the aortic valve. Aortic valve regurgitation is not visualized. Pulmonic Valve: The pulmonic valve was normal in structure. Pulmonic valve regurgitation is trivial. Aorta: The aortic root and ascending aorta are structurally normal, with no evidence of dilitation. Venous: The inferior vena cava is normal in size with greater than 50% respiratory variability, suggesting right atrial pressure of 3 mmHg. IAS/Shunts: No atrial level shunt detected by color flow Doppler.  LEFT VENTRICLE PLAX 2D LVIDd:         5.20 cm  Diastology LVIDs:         3.10 cm  LV e' medial:    9.25 cm/s LV PW:         0.40 cm  LV E/e' medial:  8.5 LV IVS:        0.40 cm  LV e' lateral:   13.60 cm/s LVOT diam:     1.80 cm  LV E/e' lateral: 5.8 LV SV:         51  LV SV Index:   29       2D Longitudinal  Strain LVOT Area:     2.54 cm 2D Strain GLS (A2C):   -28.9 %                         2D Strain GLS (A3C):   -29.0 %                         2D Strain GLS (A4C):   -29.9 %                         2D Strain GLS Avg:     -29.3 %                          3D Volume EF:                         3D EF:        61 %                         LV EDV:       168 ml                         LV ESV:       65 ml                         LV SV:        102 ml RIGHT VENTRICLE             IVC RV Basal diam:  3.70 cm     IVC diam: 2.10 cm RV S prime:     15.10 cm/s TAPSE (M-mode): 2.8 cm LEFT ATRIUM             Index       RIGHT ATRIUM          Index LA diam:        3.60 cm 2.04 cm/m  RA Area:     9.39 cm LA Vol (A2C):   43.0 ml 24.36 ml/m RA Volume:   21.20 ml 12.01 ml/m LA Vol (A4C):   37.4 ml 21.19 ml/m LA Biplane Vol: 41.3 ml 23.40 ml/m  AORTIC VALVE LVOT Vmax:   88.70 cm/s LVOT Vmean:  63.350 cm/s LVOT VTI:    0.200 m  AORTA Ao Root diam: 2.80 cm Ao Asc diam:  2.90 cm MITRAL VALVE               TRICUSPID VALVE MV Area (PHT): 3.12 cm    TR Peak grad:   11.4 mmHg MV Decel Time: 243 msec    TR Vmax:        169.00 cm/s MV E velocity: 78.40 cm/s MV A velocity: 51.40 cm/s  SHUNTS MV E/A ratio:  1.53        Systemic VTI:  0.20 m                            Systemic Diam: 1.80 cm Gwyndolyn Kaufman MD Electronically signed by Gwyndolyn Kaufman MD Signature Date/Time: 07/09/2020/6:02:08 PM    Final    MM RT RADIOACTIVE SEED LOC MAMMO GUIDE  Result Date: 07/13/2020 CLINICAL DATA:  44 year old with biopsy-proven multifocal invasive ductal carcinoma involving the UPPER OUTER QUADRANT of the RIGHT breast. The patient presents for bracketed radioactive seed localization of the adjacent masses in anticipation of lumpectomy which is scheduled for tomorrow. EXAM: MAMMOGRAPHIC GUIDED RADIOACTIVE SEED LOCALIZATION OF THE RIGHT BREAST x 2 COMPARISON:  Previous exam(s). FINDINGS: Patient presents for radioactive seed localization prior to RIGHT  breast lumpectomy. I met with the patient and we discussed the procedure of seed localization including benefits and alternatives. We discussed the high likelihood of a successful procedure. We discussed the risks of the procedure including infection, bleeding, tissue injury and further surgery. We discussed the low dose of radioactivity involved in the procedure. Informed, written consent was given. The usual time-out protocol was performed immediately prior to the procedure. Using mammographic guidance, sterile technique with chlorhexidine as skin antisepsis, 1% lidocaine as local anesthetic, 2 separate I-125 radioactive seeds were used to bracket the ribbon shaped tissue marker clip in the coil shaped tissue marker clip associated with the biopsy-proven multifocal IDC in the UPPER OUTER RIGHT breast using a superior approach. The follow-up mammogram images confirm that the seeds are appropriately positioned, bracketing the clips and masses. The images are marked for Dr. Georgette Dover. Follow-up survey of the patient confirms the presence of the radioactive seed. Order number of I-125 seed (associated with the ribbon clip anterolaterally): [id number] Total activity: 0.241 mCi Reference Date: 06/25/2020 Order number of I-125 seed (associated with the coil clip posteromedially): [id number] Total activity: 0.248 mCi Reference Date: 06/04/2020 The patient tolerated the procedure well and was released from the [facility]. She was given instructions regarding seed removal. IMPRESSION: Bracketed radioactive seed localization (x 2) of the multifocal malignancy involving the UPPER OUTER QUADRANT of the RIGHT breast. No apparent complications. Electronically Signed   By: Evangeline Dakin M.D.   On: 07/13/2020 14:29   MM RT RADIO SEED EA ADD LESION LOC MAMMO  Result Date: 07/13/2020 CLINICAL DATA:  44 year old with biopsy-proven multifocal invasive ductal carcinoma involving the UPPER OUTER QUADRANT of the RIGHT breast. The  patient presents for bracketed radioactive seed localization of the adjacent masses in anticipation of lumpectomy which is scheduled for tomorrow. EXAM: MAMMOGRAPHIC GUIDED RADIOACTIVE SEED LOCALIZATION OF THE RIGHT BREAST x 2 COMPARISON:  Previous exam(s). FINDINGS: Patient presents for radioactive seed localization prior to RIGHT breast lumpectomy. I met with the patient and we discussed the procedure of seed localization including benefits and alternatives. We discussed the high likelihood of a successful procedure. We discussed the risks of the procedure including infection, bleeding, tissue injury and further surgery. We discussed the low dose of radioactivity involved in the procedure. Informed, written consent was given. The usual time-out protocol was performed immediately prior to the procedure. Using mammographic guidance, sterile technique with chlorhexidine as skin antisepsis, 1% lidocaine as local anesthetic, 2 separate I-125 radioactive seeds were used to bracket the ribbon shaped tissue marker clip in the coil shaped tissue marker clip associated with the biopsy-proven multifocal IDC in the UPPER OUTER RIGHT breast using a superior approach. The follow-up mammogram images confirm that the seeds are appropriately positioned, bracketing the clips and masses. The images are marked for Dr. Georgette Dover. Follow-up survey of the patient confirms the presence of the radioactive seed. Order number of I-125 seed (associated with the ribbon clip anterolaterally): [id number] Total activity: 0.241 mCi Reference Date: 06/25/2020 Order number of I-125 seed (associated with the coil clip posteromedially): [id number] Total activity: 0.248 mCi Reference Date:  06/04/2020 The patient tolerated the procedure well and was released from the [facility]. She was given instructions regarding seed removal. IMPRESSION: Bracketed radioactive seed localization (x 2) of the multifocal malignancy involving the UPPER OUTER QUADRANT of the  RIGHT breast. No apparent complications. Electronically Signed   By: Evangeline Dakin M.D.   On: 07/13/2020 14:29     ELIGIBLE FOR AVAILABLE RESEARCH PROTOCOL: AET  ASSESSMENT: 44 y.o. [place] woman status post right breast upper outer quadrant biopsy 06/08/2020 for a clinical mT2 N0, stage IB invasive ductal carcinoma, grade 2, estrogen and progesterone receptor positive, HER-2 not amplified, with an MIB-1 of 5%.  (1) genetics testing 06/22/2020 through the Common Hereditary Cancers Panel offered by [facility] found no deleterious mutations in APC, ATM, AXIN2, BARD1, BMPR1A, BRCA1, BRCA2, BRIP1, CDH1, CDK4, CDKN2A (p14ARF), CDKN2A (p16INK4a), CHEK2, CTNNA1, DICER1, EPCAM (Deletion/duplication testing only), GREM1 (promoter region deletion/duplication testing only), KIT, MEN1, MLH1, MSH2, MSH3, MSH6, MUTYH, NBN, NF1, NHTL1, PALB2, PDGFRA, PMS2, POLD1, POLE, PTEN, RAD50, RAD51C, RAD51D, RNF43, SDHB, SDHC, SDHD, SMAD4, SMARCA4. STK11, TP53, TSC1, TSC2, and VHL.  The following genes were evaluated for sequence changes only: SDHA and HOXB13 c.251G>A variant only.  (a)  a variant of uncertain significance in MSH3 at c.2731T>G (p.Leu911Val).   (2) right lumpectomy and sentinel lymph node sampling 07/14/2020 showed a pT2 pN1(mic), stage IIA invasive ductal carcinoma, grade 2, with a positive superior margin  (a) additional surgery 08/03/2020  (3) MammaPrint obtained from the initial biopsy showed high risk, predicting a 93% 5-year disease-free survival with chemotherapy and significant benefit from chemotherapy (greater than 12%).  (4) cyclophosphamide and doxorubicin in dose dense fashion x4 to start 08/03/2020, to be followed by paclitaxel weekly x12  (a) echo 07/09/2020 shows an ejection fraction in the 60-65% range.  (5) adjuvant radiation to follow  (6) antiestrogens to start at the completion of local treatment   PLAN: Autumn Bullock did very well with the surgery.  She understands that a positive  margin does not mean there was anything wrong with the surgery.  The surgeons tried to remove as little breast as possible to optimize cosmesis and this means there will be a positive margin about 15% of the time.  I expect this will be cleared with her additional surgery 08/03/2020.    This will move back her treatment.  Her first dose of chemotherapy will be 08/17/2020.  We will have went over the advanced practice providers see her that day and she will see me a week later.  She will keep a symptom diary so she can tell me what the side effects of the chemo were so we can troubleshoot them for her subsequent treatments.  Her echocardiogram was excellent.  She has also met with the chemotherapy teaching nurse.  She is getting comfortable with her port.  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  Total encounter time 35 minutes.Sarajane Jews C. Shanira Tine, MD 07/23/2020 11:17 AM Medical Oncology and Hematology [facility] [place], [place] [address] Tel. [phone]    Fax. [phone]   This document serves as a record of services personally performed by Lurline Del, MD. It was created on his behalf by Wilburn Mylar, a trained medical scribe. The creation of this record is based on the scribe's personal  observations and the provider's statements to them.   I, Lurline Del MD, have reviewed the above documentation for accuracy and completeness, and I agree with the above.   *Total Encounter Time as defined by the Centers for Medicare and Medicaid Services includes, in addition to the face-to-face time of a patient visit (documented in the note above) non-face-to-face time: obtaining and reviewing outside history, ordering and reviewing medications, tests or procedures, care coordination  (communications with other health care professionals or caregivers) and documentation in the medical record.

## 2020-07-23 NOTE — Progress Notes (Signed)
Met with patient at registration to introduce myself as Arboriculturist and to offer available resources.  Discussed one-time $1000 Radio broadcast assistant to assist with personal expenses while going through treatment.  Also discussed available copay assistance for injection if ded/OOP had not been met. Patient states OOP had not been met. Advised I would enroll on her behalf, she was very appreciative.  Gave her  My card for any additional financial questions or concerns.

## 2020-07-26 ENCOUNTER — Encounter: Payer: Self-pay | Admitting: *Deleted

## 2020-07-26 ENCOUNTER — Ambulatory Visit: Payer: Self-pay | Admitting: Surgery

## 2020-07-26 ENCOUNTER — Telehealth: Payer: Self-pay | Admitting: Nurse Practitioner

## 2020-07-26 NOTE — Telephone Encounter (Signed)
Scheduled appts per 11/19 los. Pt confirmed new arrival time.

## 2020-07-26 NOTE — H&P (Signed)
History of Present Illness Autumn Bullock. Brailon Don MD; 07/26/2020 9:02 AM) The patient is a 44 year old female presenting for a post-operative visit. [place] - 06/16/20 Magrinat Isidore Moos  This is a healthy 44 year old female who presents after routine screening mammogram revealed two suspicious areas in the right upper outer quadrant. She underwent further evaluation with mammogram and [place] that revealed a mass at 11:30 in the right breast 2.3 x 1.5 x 1.7 cm 3 cmfn. Just superficial to this area, there is a second 0.8 x 0.6 x 0.7 cm mass. 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  On 07/14/20, she underwent port placement, right bracketed radioactive seed localized lumpectomy and sentinel lymph node biopsy. The sentinel lymph node showed micrometastasis. The tumor was 3.5 cm with some surrounding DCIS. The superior margin was positive. She is scheduled for reexcision next week. Her right breast feels heavy due to the seroma. She still has some soreness in her axilla. She feels pulling at the port site, likely due to the weight of her right breast from the seroma.   Problem List/Past Medical Rodman Key K. 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 Kriste Broman, MD; 07/26/2020 9:03 AM) Cesarean Section - 1  Diagnostic Studies History Rodman Key K. Zaryia Markel, MD; 07/26/2020 9:03 AM) Colonoscopy >10 years ago Mammogram within last year Pap Smear 1-5 years ago  Allergies Darden Palmer, RMA; 07/26/2020 8:39 AM) Adhesive Tape Rash. redness, sensitivity to adhesives Allergies Reconciled  Medication History Darden Palmer, [place]; 07/26/2020 8:39 AM) No Current Medications Medications Reconciled  Social History Autumn Bullock. Tecia Cinnamon, MD; 07/26/2020 9:03 AM) Alcohol use Moderate alcohol use. Caffeine use Coffee. Illicit drug use Remotely quit drug use. Tobacco use Former smoker.  Family  History Autumn Bullock. Charlyn Vialpando, MD; 07/26/2020 9:03 AM) Alcohol Abuse Brother, Sister. Diabetes Mellitus Family Members In General. Heart Disease Family Members In General. Hypertension Father, Sister. Migraine Headache Mother, Sister.  Pregnancy / Birth History Autumn Bullock. Marquia Costello, MD; 07/26/2020 9:03 AM) Age at menarche 30 years. Gravida 1 Length (months) of breastfeeding 3-6 Maternal age 63-35 Para 1 Regular periods  Other Problems Autumn Bullock. Kyron Schlitt, MD; 07/26/2020 9:03 AM) Back Pain Hemorrhoids    Vitals Darden Palmer RMA; 07/26/2020 8:39 AM) 07/26/2020 8:39 AM Weight: 162.5 lb Height: 65in Body Surface Area: 1.81 m Body Mass Index: 27.04 kg/m  Temp.: 97.50F  Pulse: 70 (Regular)  P.OX: 99% (Room air) BP: 120/70(Sitting, Left Arm, Standard)        Physical Exam Rodman Key K. Minh Jasper MD; 07/26/2020 9:03 AM)  The physical exam findings are as follows: Note:Her port site, circumareolar incision, an axillary incision are all well-healed with no sign of infection. I can palpate a seroma underneath the lumpectomy site but it is not causing any skin changes.    Assessment & Plan Autumn Bullock. 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  Call us sooner as needed.  Note:I answered all of her questions. She is accompanied by her husband. We will proceed with reexcision next week.  The surgical procedure has been discussed with the patient. Potential risks, benefits, alternative treatments, and expected outcomes have been explained. All of the patient's questions at this time have been answered. The likelihood of reaching the patient's treatment goal is good. The patient understand the  proposed surgical procedure and wishes to proceed.  Autumn Bullock. Georgette Dover, MD, [facility]  General/ Trauma Surgery   07/26/2020 9:04  AM

## 2020-07-26 NOTE — Progress Notes (Signed)
      Enhanced Recovery after Surgery for Orthopedics Enhanced Recovery after Surgery is a protocol used to improve the stress on your body and your recovery after surgery.  Patient Instructions  . The night before surgery:  o No food after midnight. ONLY clear liquids after midnight  . The day of surgery (if you do NOT have diabetes):  o Drink ONE (1) Pre-Surgery Clear Ensure as directed.   o This drink was given to you during your hospital  pre-op appointment visit. o The pre-op nurse will instruct you on the time to drink the  Pre-Surgery Ensure depending on your surgery time. o Finish the drink at the designated time by the pre-op nurse.  o Nothing else to drink after completing the  Pre-Surgery Clear Ensure.  . The day of surgery (if you have diabetes): o Drink ONE (1) Gatorade 2 (G2) as directed. o This drink was given to you during your hospital  pre-op appointment visit.  o The pre-op nurse will instruct you on the time to drink the   Gatorade 2 (G2) depending on your surgery time. o Color of the Gatorade may vary. Red is not allowed. o Nothing else to drink after completing the  Gatorade 2 (G2).         If you have questions, please contact your surgeon's office.    Surgical soap given with instructions. Patient verbalized understanding.  

## 2020-07-26 NOTE — H&P (View-Only) (Signed)
History of Present Illness Autumn Bullock. Mionna Advincula MD; 07/26/2020 9:02 AM) The patient is a 44 year old female presenting for a post-operative visit. [place] - 06/16/20 Autumn Bullock  This is a healthy 44 year old female who presents after routine screening mammogram revealed two suspicious areas in the right upper outer quadrant. She underwent further evaluation with mammogram and [place] that revealed a mass at 11:30 in the right breast 2.3 x 1.5 x 1.7 cm 3 cmfn. Just superficial to this area, there is a second 0.8 x 0.6 x 0.7 cm mass. 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  On 07/14/20, she underwent port placement, right bracketed radioactive seed localized lumpectomy and sentinel lymph node biopsy. The sentinel lymph node showed micrometastasis. The tumor was 3.5 cm with some surrounding DCIS. The superior margin was positive. She is scheduled for reexcision next week. Her right breast feels heavy due to the seroma. She still has some soreness in her axilla. She feels pulling at the port site, likely due to the weight of her right breast from the seroma.   Problem List/Past Medical Rodman Key K. 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 Karriem Muench, MD; 07/26/2020 9:03 AM) Cesarean Section - 1  Diagnostic Studies History Rodman Key K. Brendaly Townsel, MD; 07/26/2020 9:03 AM) Colonoscopy >10 years ago Mammogram within last year Pap Smear 1-5 years ago  Allergies Darden Palmer, RMA; 07/26/2020 8:39 AM) Adhesive Tape Rash. redness, sensitivity to adhesives Allergies Reconciled  Medication History Darden Palmer, [place]; 07/26/2020 8:39 AM) No Current Medications Medications Reconciled  Social History Autumn Bullock. Jina Olenick, MD; 07/26/2020 9:03 AM) Alcohol use Moderate alcohol use. Caffeine use Coffee. Illicit drug use Remotely quit drug use. Tobacco use Former smoker.  Family  History Autumn Bullock. Arriona Prest, MD; 07/26/2020 9:03 AM) Alcohol Abuse Brother, Sister. Diabetes Mellitus Family Members In General. Heart Disease Family Members In General. Hypertension Father, Sister. Migraine Headache Mother, Sister.  Pregnancy / Birth History Autumn Bullock. Darick Fetters, MD; 07/26/2020 9:03 AM) Age at menarche 40 years. Gravida 1 Length (months) of breastfeeding 3-6 Maternal age 72-35 Para 1 Regular periods  Other Problems Autumn Bullock. Anas Reister, MD; 07/26/2020 9:03 AM) Back Pain Hemorrhoids    Vitals Darden Palmer RMA; 07/26/2020 8:39 AM) 07/26/2020 8:39 AM Weight: 162.5 lb Height: 65in Body Surface Area: 1.81 m Body Mass Index: 27.04 kg/m  Temp.: 97.30F  Pulse: 70 (Regular)  P.OX: 99% (Room air) BP: 120/70(Sitting, Left Arm, Standard)        Physical Exam Rodman Key K. Jurnei Latini MD; 07/26/2020 9:03 AM)  The physical exam findings are as follows: Note:Her port site, circumareolar incision, an axillary incision are all well-healed with no sign of infection. I can palpate a seroma underneath the lumpectomy site but it is not causing any skin changes.    Assessment & Plan Autumn Bullock. 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  Call us sooner as needed.  Note:I answered all of her questions. She is accompanied by her husband. We will proceed with reexcision next week.  The surgical procedure has been discussed with the patient. Potential risks, benefits, alternative treatments, and expected outcomes have been explained. All of the patient's questions at this time have been answered. The likelihood of reaching the patient's treatment goal is good. The patient understand the  proposed surgical procedure and wishes to proceed.  Autumn Bullock. Georgette Dover, MD, [facility]  General/ Trauma Surgery   07/26/2020 9:04  AM

## 2020-07-27 ENCOUNTER — Telehealth: Payer: Self-pay

## 2020-07-27 NOTE — Telephone Encounter (Signed)
Pt called for clarification of appts in Dec. Pt was given information regarding this and verbalizes understanding.

## 2020-07-30 ENCOUNTER — Other Ambulatory Visit (HOSPITAL_COMMUNITY)
Admission: RE | Admit: 2020-07-30 | Discharge: 2020-07-30 | Disposition: A | Discharge status: Home / Self Care | Payer: BC Managed Care – PPO | Source: Ambulatory Visit | Attending: Surgery | Admitting: Surgery

## 2020-07-30 DIAGNOSIS — Z01818 Encounter for other preprocedural examination: Secondary | ICD-10-CM | POA: Diagnosis present

## 2020-07-30 DIAGNOSIS — Z20822 Contact with and (suspected) exposure to covid-19: Secondary | ICD-10-CM | POA: Diagnosis not present

## 2020-07-30 LAB — SARS CORONAVIRUS 2 (TAT 6-24 HRS): SARS Coronavirus 2: NEGATIVE

## 2020-08-03 ENCOUNTER — Other Ambulatory Visit: Payer: Self-pay

## 2020-08-03 ENCOUNTER — Ambulatory Visit (HOSPITAL_BASED_OUTPATIENT_CLINIC_OR_DEPARTMENT_OTHER): Payer: BC Managed Care – PPO | Admitting: Anesthesiology

## 2020-08-03 ENCOUNTER — Other Ambulatory Visit: Payer: BC Managed Care – PPO

## 2020-08-03 ENCOUNTER — Ambulatory Visit: Payer: BC Managed Care – PPO

## 2020-08-03 ENCOUNTER — Encounter (HOSPITAL_BASED_OUTPATIENT_CLINIC_OR_DEPARTMENT_OTHER)
Admission: RE | Disposition: A | Discharge status: Home / Self Care | Payer: Self-pay | Source: Home / Self Care | Attending: Surgery

## 2020-08-03 ENCOUNTER — Other Ambulatory Visit (HOSPITAL_BASED_OUTPATIENT_CLINIC_OR_DEPARTMENT_OTHER): Payer: Self-pay | Admitting: Surgery

## 2020-08-03 ENCOUNTER — Ambulatory Visit (HOSPITAL_BASED_OUTPATIENT_CLINIC_OR_DEPARTMENT_OTHER)
Admission: RE | Admit: 2020-08-03 | Discharge: 2020-08-03 | Disposition: A | Discharge status: Home / Self Care | Payer: BC Managed Care – PPO | Attending: Surgery | Admitting: Surgery

## 2020-08-03 ENCOUNTER — Encounter (HOSPITAL_BASED_OUTPATIENT_CLINIC_OR_DEPARTMENT_OTHER): Payer: Self-pay | Admitting: Surgery

## 2020-08-03 DIAGNOSIS — Z820 Family history of epilepsy and other diseases of the nervous system: Secondary | ICD-10-CM | POA: Diagnosis not present

## 2020-08-03 DIAGNOSIS — C50911 Malignant neoplasm of unspecified site of right female breast: Secondary | ICD-10-CM | POA: Diagnosis present

## 2020-08-03 DIAGNOSIS — Z170 Estrogen receptor positive status [ER+]: Secondary | ICD-10-CM | POA: Insufficient documentation

## 2020-08-03 DIAGNOSIS — Z833 Family history of diabetes mellitus: Secondary | ICD-10-CM | POA: Insufficient documentation

## 2020-08-03 DIAGNOSIS — Z811 Family history of alcohol abuse and dependence: Secondary | ICD-10-CM | POA: Insufficient documentation

## 2020-08-03 DIAGNOSIS — C50411 Malignant neoplasm of upper-outer quadrant of right female breast: Secondary | ICD-10-CM | POA: Diagnosis not present

## 2020-08-03 DIAGNOSIS — Z8249 Family history of ischemic heart disease and other diseases of the circulatory system: Secondary | ICD-10-CM | POA: Insufficient documentation

## 2020-08-03 DIAGNOSIS — Z87891 Personal history of nicotine dependence: Secondary | ICD-10-CM | POA: Diagnosis not present

## 2020-08-03 HISTORY — DX: Other specified postprocedural states: Z98.890

## 2020-08-03 HISTORY — DX: Nausea with vomiting, unspecified: R11.2

## 2020-08-03 HISTORY — PX: RE-EXCISION OF BREAST LUMPECTOMY: SHX6048

## 2020-08-03 LAB — POCT PREGNANCY, URINE: Preg Test, Ur: NEGATIVE

## 2020-08-03 SURGERY — EXCISION, LESION, BREAST
Anesthesia: General | Site: Breast | Laterality: Right

## 2020-08-03 MED ORDER — PROPOFOL 10 MG/ML IV BOLUS
INTRAVENOUS | Status: AC
  Filled 2020-08-03: qty 20

## 2020-08-03 MED ORDER — FENTANYL CITRATE (PF) 100 MCG/2ML IJ SOLN
INTRAMUSCULAR | Status: AC
  Filled 2020-08-03: qty 2

## 2020-08-03 MED ORDER — CHLORHEXIDINE GLUCONATE CLOTH 2 % EX PADS: 6.0000 | MEDICATED_PAD | Freq: Once | CUTANEOUS | Status: DC

## 2020-08-03 MED ORDER — OXYCODONE HCL 5 MG/5ML PO SOLN: 5.0000 mg | Freq: Once | ORAL | Status: DC | PRN

## 2020-08-03 MED ORDER — DEXAMETHASONE SODIUM PHOSPHATE 10 MG/ML IJ SOLN
INTRAMUSCULAR | Status: DC | PRN
  Administered 2020-08-03: 10 mg via INTRAVENOUS

## 2020-08-03 MED ORDER — CEFAZOLIN SODIUM-DEXTROSE 2-4 GM/100ML-% IV SOLN
2.0000 g | INTRAVENOUS | Status: AC
  Administered 2020-08-03: 2 g via INTRAVENOUS

## 2020-08-03 MED ORDER — BUPIVACAINE HCL (PF) 0.25 % IJ SOLN
INTRAMUSCULAR | Status: AC
  Filled 2020-08-03: qty 30

## 2020-08-03 MED ORDER — ACETAMINOPHEN 10 MG/ML IV SOLN: 1000.0000 mg | Freq: Once | INTRAVENOUS | Status: DC | PRN

## 2020-08-03 MED ORDER — AMISULPRIDE (ANTIEMETIC) 5 MG/2ML IV SOLN
INTRAVENOUS | Status: AC
  Filled 2020-08-03: qty 2

## 2020-08-03 MED ORDER — ACETAMINOPHEN 160 MG/5ML PO SOLN: 1000.0000 mg | Freq: Once | ORAL | Status: DC | PRN

## 2020-08-03 MED ORDER — ACETAMINOPHEN 500 MG PO TABS: 1000.0000 mg | ORAL_TABLET | Freq: Once | ORAL | Status: DC | PRN

## 2020-08-03 MED ORDER — OXYCODONE HCL 5 MG PO TABS: 5.0000 mg | ORAL_TABLET | Freq: Once | ORAL | Status: DC | PRN

## 2020-08-03 MED ORDER — MIDAZOLAM HCL 2 MG/2ML IJ SOLN
INTRAMUSCULAR | Status: AC
  Filled 2020-08-03: qty 2

## 2020-08-03 MED ORDER — FENTANYL CITRATE (PF) 100 MCG/2ML IJ SOLN: 25.0000 ug | INTRAMUSCULAR | Status: DC | PRN

## 2020-08-03 MED ORDER — FENTANYL CITRATE (PF) 100 MCG/2ML IJ SOLN
INTRAMUSCULAR | Status: DC | PRN
  Administered 2020-08-03 (×2): 25 ug via INTRAVENOUS

## 2020-08-03 MED ORDER — MIDAZOLAM HCL 5 MG/5ML IJ SOLN
INTRAMUSCULAR | Status: DC | PRN
  Administered 2020-08-03: 2 mg via INTRAVENOUS

## 2020-08-03 MED ORDER — PROPOFOL 10 MG/ML IV BOLUS
INTRAVENOUS | Status: DC | PRN
  Administered 2020-08-03: 200 mg via INTRAVENOUS

## 2020-08-03 MED ORDER — ONDANSETRON 4 MG PO TBDP: 4.0000 mg | ORAL_TABLET | Freq: Three times a day (TID) | ORAL | 0 refills | Status: DC | PRN

## 2020-08-03 MED ORDER — ACETAMINOPHEN 500 MG PO TABS
1000.0000 mg | ORAL_TABLET | ORAL | Status: AC
  Administered 2020-08-03: 1000 mg via ORAL

## 2020-08-03 MED ORDER — BUPIVACAINE HCL (PF) 0.25 % IJ SOLN
INTRAMUSCULAR | Status: DC | PRN
  Administered 2020-08-03: 10 mL

## 2020-08-03 MED ORDER — GABAPENTIN 300 MG PO CAPS
300.0000 mg | ORAL_CAPSULE | ORAL | Status: AC
  Administered 2020-08-03: 300 mg via ORAL

## 2020-08-03 MED ORDER — CEFAZOLIN SODIUM-DEXTROSE 2-4 GM/100ML-% IV SOLN
INTRAVENOUS | Status: AC
  Filled 2020-08-03: qty 100

## 2020-08-03 MED ORDER — GABAPENTIN 300 MG PO CAPS
ORAL_CAPSULE | ORAL | Status: AC
  Filled 2020-08-03: qty 1

## 2020-08-03 MED ORDER — ONDANSETRON HCL 4 MG/2ML IJ SOLN
INTRAMUSCULAR | Status: DC | PRN
  Administered 2020-08-03: 4 mg via INTRAVENOUS

## 2020-08-03 MED ORDER — AMISULPRIDE (ANTIEMETIC) 5 MG/2ML IV SOLN
5.0000 mg | Freq: Once | INTRAVENOUS | Status: AC
  Administered 2020-08-03: 5 mg via INTRAVENOUS

## 2020-08-03 MED ORDER — PROPOFOL 500 MG/50ML IV EMUL
INTRAVENOUS | Status: DC | PRN
  Administered 2020-08-03: 175 ug/kg/min via INTRAVENOUS

## 2020-08-03 MED ORDER — LIDOCAINE HCL (CARDIAC) PF 100 MG/5ML IV SOSY
PREFILLED_SYRINGE | INTRAVENOUS | Status: DC | PRN
  Administered 2020-08-03: 60 mg via INTRAVENOUS

## 2020-08-03 MED ORDER — LACTATED RINGERS IV SOLN: INTRAVENOUS | Status: DC

## 2020-08-03 MED ORDER — ACETAMINOPHEN 500 MG PO TABS
ORAL_TABLET | ORAL | Status: AC
  Filled 2020-08-03: qty 2

## 2020-08-03 MED FILL — ONDANSETRON ODT 4 MG TABLET: 4 | 21 days supply | Qty: 18 | Fill #0

## 2020-08-03 SURGICAL SUPPLY — 46 items
BENZOIN TINCTURE PRP APPL 2/3 (GAUZE/BANDAGES/DRESSINGS) ×2 IMPLANT
BLADE HEX COATED 2.75 (ELECTRODE) IMPLANT
BLADE SURG 15 STRL LF DISP TIS (BLADE) ×1 IMPLANT
BLADE SURG 15 STRL SS (BLADE) ×2
CANISTER SUCT 1200ML W/VALVE (MISCELLANEOUS) ×2 IMPLANT
CHLORAPREP W/TINT 26 (MISCELLANEOUS) ×2 IMPLANT
COVER BACK TABLE 60X90IN (DRAPES) ×2 IMPLANT
COVER MAYO STAND STRL (DRAPES) ×2 IMPLANT
COVER WAND RF STERILE (DRAPES) IMPLANT
DECANTER SPIKE VIAL GLASS SM (MISCELLANEOUS) IMPLANT
DRAPE LAPAROTOMY 100X72 PEDS (DRAPES) ×2 IMPLANT
DRAPE UTILITY XL STRL (DRAPES) ×2 IMPLANT
DRSG TEGADERM 4X4.75 (GAUZE/BANDAGES/DRESSINGS) ×2 IMPLANT
ELECT REM PT RETURN 9FT ADLT (ELECTROSURGICAL) ×2
ELECTRODE REM PT RTRN 9FT ADLT (ELECTROSURGICAL) ×1 IMPLANT
GAUZE SPONGE 4X4 12PLY STRL LF (GAUZE/BANDAGES/DRESSINGS) ×2 IMPLANT
GLOVE BIO SURGEON STRL SZ7 (GLOVE) ×2 IMPLANT
GLOVE BIOGEL PI IND STRL 6.5 (GLOVE) ×2 IMPLANT
GLOVE BIOGEL PI IND STRL 7.5 (GLOVE) ×1 IMPLANT
GLOVE BIOGEL PI INDICATOR 6.5 (GLOVE) ×2
GLOVE BIOGEL PI INDICATOR 7.5 (GLOVE) ×1
GLOVE ECLIPSE 6.5 STRL STRAW (GLOVE) ×2 IMPLANT
GOWN STRL REUS W/ TWL LRG LVL3 (GOWN DISPOSABLE) ×1 IMPLANT
GOWN STRL REUS W/ TWL XL LVL3 (GOWN DISPOSABLE) ×1 IMPLANT
GOWN STRL REUS W/TWL LRG LVL3 (GOWN DISPOSABLE) ×2
GOWN STRL REUS W/TWL XL LVL3 (GOWN DISPOSABLE) ×2
KIT MARKER MARGIN INK (KITS) ×2 IMPLANT
NEEDLE HYPO 25X1 1.5 SAFETY (NEEDLE) ×2 IMPLANT
NS IRRIG 1000ML POUR BTL (IV SOLUTION) ×2 IMPLANT
PACK BASIN DAY SURGERY FS (CUSTOM PROCEDURE TRAY) ×2 IMPLANT
PENCIL SMOKE EVACUATOR (MISCELLANEOUS) ×2 IMPLANT
SLEEVE SCD COMPRESS KNEE MED (MISCELLANEOUS) ×2 IMPLANT
SPONGE GAUZE 2X2 8PLY STRL LF (GAUZE/BANDAGES/DRESSINGS) IMPLANT
SPONGE LAP 4X18 RFD (DISPOSABLE) ×2 IMPLANT
STRIP CLOSURE SKIN 1/2X4 (GAUZE/BANDAGES/DRESSINGS) ×2 IMPLANT
SUT CHROMIC 3 0 SH 27 (SUTURE) IMPLANT
SUT MON AB 4-0 PC3 18 (SUTURE) ×2 IMPLANT
SUT SILK 2 0 SH (SUTURE) IMPLANT
SUT VIC AB 3-0 SH 27 (SUTURE) ×2
SUT VIC AB 3-0 SH 27X BRD (SUTURE) ×1 IMPLANT
SYR BULB EAR ULCER 3OZ GRN STR (SYRINGE) ×2 IMPLANT
SYR CONTROL 10ML LL (SYRINGE) ×2 IMPLANT
TOWEL GREEN STERILE FF (TOWEL DISPOSABLE) ×2 IMPLANT
TRAY FAXITRON CT DISP (TRAY / TRAY PROCEDURE) IMPLANT
TUBE CONNECTING 20X1/4 (TUBING) ×2 IMPLANT
YANKAUER SUCT BULB TIP NO VENT (SUCTIONS) ×2 IMPLANT

## 2020-08-03 NOTE — Discharge Instructions (Signed)
Next dose of Tylenol can be given at 7:00pm if needed.    Post Anesthesia Home Care Instructions  Activity: Get plenty of rest for the remainder of the day. A responsible individual must stay with you for 24 hours following the procedure.  For the next 24 hours, DO NOT: -Drive a car -Paediatric nurse -Drink alcoholic beverages -Take any medication unless instructed by your physician -Make any legal decisions or sign important papers.  Meals: Start with liquid foods such as gelatin or soup. Progress to regular foods as tolerated. Avoid greasy, spicy, heavy foods. If nausea and/or vomiting occur, drink only clear liquids until the nausea and/or vomiting subsides. Call your physician if vomiting continues.  Special Instructions/Symptoms: Your throat may feel dry or sore from the anesthesia or the breathing tube placed in your throat during surgery. If this causes discomfort, gargle with warm salt water. The discomfort should disappear within 24 hours.  If you had a scopolamine patch placed behind your ear for the management of post- operative nausea and/or vomiting:  1. The medication in the patch is effective for 72 hours, after which it should be removed.  Wrap patch in a tissue and discard in the trash. Wash hands thoroughly with soap and water. 2. You may remove the patch earlier than 72 hours if you experience unpleasant side effects which may include dry mouth, dizziness or visual disturbances. 3. Avoid touching the patch. Wash your hands with soap and water after contact with the patch.      [place] Office Phone Number [phone]  BREAST BIOPSY/ PARTIAL MASTECTOMY: POST OP INSTRUCTIONS  Always review your discharge instruction sheet given to you by the facility where your surgery was performed.  IF YOU HAVE DISABILITY OR FAMILY LEAVE FORMS, YOU MUST BRING THEM TO THE OFFICE FOR PROCESSING.  DO NOT GIVE THEM TO YOUR DOCTOR.  1. A prescription for pain  medication may be given to you upon discharge.  Take your pain medication as prescribed, if needed.  If narcotic pain medicine is not needed, then you may take acetaminophen (Tylenol) or ibuprofen (Advil) as needed. 2. Take your usually prescribed medications unless otherwise directed 3. If you need a refill on your pain medication, please contact your pharmacy.  They will contact our office to request authorization.  Prescriptions will not be filled after 5pm or on week-ends. 4. You should eat very light the first 24 hours after surgery, such as soup, crackers, pudding, etc.  Resume your normal diet the day after surgery. 5. Most patients will experience some swelling and bruising in the breast.  Ice packs and a good support bra will help.  Swelling and bruising can take several days to resolve.  6. It is common to experience some constipation if taking pain medication after surgery.  Increasing fluid intake and taking a stool softener will usually help or prevent this problem from occurring.  A mild laxative (Milk of Magnesia or Miralax) should be taken according to package directions if there are no bowel movements after 48 hours. 7. Unless discharge instructions indicate otherwise, you may remove your bandages 24-48 hours after surgery, and you may shower at that time.  You may have steri-strips (small skin tapes) in place directly over the incision.  These strips should be left on the skin for 7-10 days.  If your surgeon used skin glue on the incision, you may shower in 24 hours.  The glue will flake off over the next 2-3 weeks.  Any  sutures or staples will be removed at the office during your follow-up visit. 8. ACTIVITIES:  You may resume regular daily activities (gradually increasing) beginning the next day.  Wearing a good support bra or sports bra minimizes pain and swelling.  You may have sexual intercourse when it is comfortable. a. You may drive when you no longer are taking prescription pain  medication, you can comfortably wear a seatbelt, and you can safely maneuver your car and apply brakes. b. RETURN TO WORK:  ______________________________________________________________________________________ 9. You should see your doctor in the office for a follow-up appointment approximately two weeks after your surgery.  Your doctor's nurse will typically make your follow-up appointment when she calls you with your pathology report.  Expect your pathology report 2-3 business days after your surgery.  You may call to check if you do not hear from [place] after three days. 10. OTHER INSTRUCTIONS: _______________________________________________________________________________________________ _____________________________________________________________________________________________________________________________________ _____________________________________________________________________________________________________________________________________ _____________________________________________________________________________________________________________________________________  WHEN TO CALL YOUR DOCTOR: 1. Fever over 101.0 2. Nausea and/or vomiting. 3. Extreme swelling or bruising. 4. Continued bleeding from incision. 5. Increased pain, redness, or drainage from the incision.  The clinic staff is available to answer your questions during regular business hours.  Please don't hesitate to call and ask to speak to one of the nurses for clinical concerns.  If you have a medical emergency, go to the nearest emergency room or call 911.  A surgeon from [facility] is always on call at the hospital.  For further questions, please visit [email or web address]

## 2020-08-03 NOTE — Interval H&P Note (Signed)
History and Physical Interval Note:  08/03/2020 12:19 PM  Autumn Bullock  has presented today for surgery, with the diagnosis of [place].  The various methods of treatment have been discussed with the patient and family. After consideration of risks, benefits and other options for treatment, the patient has consented to  Procedure(s) with comments: RE-EXCISION OF RIGHT BREAST LUMPECTOMY SUPERIOR MARGINS (Right) - LMA as a surgical intervention.  The patient's history has been reviewed, patient examined, no change in status, stable for surgery.  I have reviewed the patient's chart and labs.  Questions were answered to the patient's satisfaction.     Maia Petties

## 2020-08-03 NOTE — Anesthesia Preprocedure Evaluation (Signed)
Anesthesia Evaluation  Patient identified by MRN, date of birth, ID band Patient awake    Reviewed: Allergy & Precautions, NPO status , Patient's Chart, lab work & pertinent test results  History of Anesthesia Complications (+) PONV and history of anesthetic complications  Airway Mallampati: II  TM Distance: >3 FB Neck ROM: Full    Dental  (+) Dental Advisory Given, Teeth Intact   Pulmonary neg pulmonary ROS, neg shortness of breath, neg recent URI,  Covid-19 Nucleic Acid Test Results Lab Results      Component                Value               Date                      SARSCOV2NAA              NEGATIVE            07/30/2020                [place]              NEGATIVE            07/10/2020              breath sounds clear to auscultation       Cardiovascular negative cardio ROS   Rhythm:Regular     Neuro/Psych negative neurological ROS  negative psych ROS   GI/Hepatic negative GI ROS, Neg liver ROS,   Endo/Other  negative endocrine ROS  Renal/GU negative Renal ROS     Musculoskeletal negative musculoskeletal ROS (+)   Abdominal   Peds  Hematology negative hematology ROS (+) Lab Results      Component                Value               Date                      WBC                      6.0                 06/16/2020                HGB                      13.5                06/16/2020                HCT                      39.5                06/16/2020                MCV                      96.6                06/16/2020                PLT                      263  06/16/2020              Anesthesia Other Findings   Reproductive/Obstetrics negative OB ROS Lab Results      Component                Value               Date                      PREGTESTUR               NEGATIVE            08/03/2020                                        Anesthesia  Physical Anesthesia Plan  ASA: I  Anesthesia Plan: General   Post-op Pain Management:    Induction: Intravenous  PONV Risk Score and Plan: 4 or greater and Ondansetron, Dexamethasone, Propofol infusion, TIVA and Midazolam  Airway Management Planned: LMA  Additional Equipment: None  Intra-op Plan:   Post-operative Plan: Extubation in OR  Informed Consent: I have reviewed the patients History and Physical, chart, labs and discussed the procedure including the risks, benefits and alternatives for the proposed anesthesia with the patient or authorized representative who has indicated his/her understanding and acceptance.     Dental advisory given  Plan Discussed with: CRNA and Surgeon  Anesthesia Plan Comments:         Anesthesia Quick Evaluation

## 2020-08-03 NOTE — Op Note (Signed)
Pre-op Diagnosis:  Invasive ductal carcinoma - right breast - positive superior margin Post-op Diagnosis: same Procedure:  Right radioactive seed localized lumpectomy Surgeon:  Nilda Keathley K. Anesthesia:  GEN - LMA Indications:  This is a healthy 44 year old female who presents after routine screening mammogram revealed two suspicious areas in the right upper outer quadrant. She underwent further evaluation with mammogram and [place] that revealed a mass at 11:30 in the right breast 2.3 x 1.5 x 1.7 cm 3 cmfn. Just superficial to this area, there is a second 0.8 x 0.6 x 0.7 cm mass. 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  On 07/14/20, she underwent port placement, right bracketed radioactive seed localized lumpectomy and sentinel lymph node biopsy. The sentinel lymph node showed micrometastasis. The tumor was 3.5 cm with some surrounding DCIS. The superior margin was positive.   Description of procedure: The patient is brought to the operating room placed in supine position on the operating room table. After an adequate level of general anesthesia was obtained, her right breast was prepped with ChloraPrep and draped in sterile fashion. A timeout was taken to ensure the proper patient and proper procedure. We opened her previous circumareolar incision after infiltrating with 0.25% Marcaine. Dissection was carried down in the breast tissue with cautery. We entered the lumpectomy cavity and evacuated the seroma.   We excised an additional 1 cm thickness of the superior margin. The specimen was removed and was oriented with a paint kit. This was sent for pathologic examination. We inspected carefully for hemostasis. The wound was thoroughly irrigated. The wound was closed with a deep layer of 3-0 Vicryl and a subcuticular layer of 4-0 Monocryl. Benzoin Steri-Strips were applied. The patient was then extubated and brought to the recovery room in stable condition. All sponge,  instrument, and needle counts are correct.  Autumn Bullock. Georgette Dover, MD, [facility]  General/ Trauma Surgery  08/03/2020 2:11 PM

## 2020-08-03 NOTE — Anesthesia Procedure Notes (Signed)
Procedure Name: LMA Insertion Date/Time: 08/03/2020 1:36 PM Performed by: Lavonia Dana, CRNA Pre-anesthesia Checklist: Patient identified, Emergency Drugs available, Suction available and Patient being monitored Patient Re-evaluated:Patient Re-evaluated prior to induction Oxygen Delivery Method: Circle system utilized Preoxygenation: Pre-oxygenation with 100% oxygen Induction Type: IV induction Ventilation: Mask ventilation without difficulty LMA: LMA inserted LMA Size: 4.0 Number of attempts: 1 Airway Equipment and Method: Bite block Placement Confirmation: positive ETCO2 Tube secured with: Tape Dental Injury: Teeth and Oropharynx as per pre-operative assessment

## 2020-08-04 ENCOUNTER — Encounter (HOSPITAL_BASED_OUTPATIENT_CLINIC_OR_DEPARTMENT_OTHER): Payer: Self-pay | Admitting: Surgery

## 2020-08-04 NOTE — Transfer of Care (Signed)
Immediate Anesthesia Transfer of Care Note  Patient: Autumn Bullock  Procedure(s) Performed: RE-EXCISION OF RIGHT BREAST LUMPECTOMY SUPERIOR MARGINS (Right Breast)  Patient Location: PACU  Anesthesia Type:General  Level of Consciousness: awake, alert  and oriented  Airway & Oxygen Therapy: Patient Spontanous Breathing and Patient connected to face mask oxygen  Post-op Assessment: Report given to RN and Post -op Vital signs reviewed and stable  Post vital signs: Reviewed and stable  Last Vitals:  Vitals Value Taken Time  BP 117/74 08/03/20 1512  Temp 36.5 C 08/03/20 1512  Pulse 57 08/03/20 1512  Resp 18 08/03/20 1512  SpO2 100 % 08/03/20 1512    Last Pain:  Vitals:   08/03/20 1512  TempSrc:   PainSc: 1       Patients Stated Pain Goal: 3 (33/74/45 1460)  Complications: No complications documented.

## 2020-08-05 ENCOUNTER — Telehealth: Payer: Self-pay | Admitting: *Deleted

## 2020-08-05 ENCOUNTER — Encounter: Payer: Self-pay | Admitting: *Deleted

## 2020-08-05 LAB — SURGICAL PATHOLOGY

## 2020-08-05 NOTE — Anesthesia Postprocedure Evaluation (Signed)
Anesthesia Post Note  Patient: Autumn Bullock  Procedure(s) Performed: RE-EXCISION OF RIGHT BREAST LUMPECTOMY SUPERIOR MARGINS (Right Breast)     Patient location during evaluation: PACU Anesthesia Type: General Level of consciousness: awake and alert Pain management: pain level controlled Vital Signs Assessment: post-procedure vital signs reviewed and stable Respiratory status: spontaneous breathing, nonlabored ventilation, respiratory function stable and patient connected to nasal cannula oxygen Cardiovascular status: blood pressure returned to baseline and stable Postop Assessment: no apparent nausea or vomiting Anesthetic complications: no   No complications documented.  Last Vitals:  Vitals:   08/03/20 1445 08/03/20 1512  BP: 99/73 117/74  Pulse: (!) 55 (!) 57  Resp: 15 18  Temp:  36.5 C  SpO2: 100% 100%    Last Pain:  Vitals:   08/03/20 1512  TempSrc:   PainSc: 1                  Edis Huish

## 2020-08-05 NOTE — Telephone Encounter (Signed)
Spoke to pt concerning injection after AC. Scheduling msg sent to schedule appts 2 days post [facility]. Gave pt number to pt accounting for itemized bill for insurance as well as links to help with benefits.  No further needs voiced.

## 2020-08-09 ENCOUNTER — Other Ambulatory Visit: Payer: Self-pay

## 2020-08-09 ENCOUNTER — Encounter: Payer: Self-pay | Admitting: Physical Therapy

## 2020-08-09 ENCOUNTER — Other Ambulatory Visit: Payer: Self-pay | Admitting: Oncology

## 2020-08-09 ENCOUNTER — Ambulatory Visit: Payer: BC Managed Care – PPO | Attending: Surgery | Admitting: Physical Therapy

## 2020-08-09 DIAGNOSIS — R600 Localized edema: Secondary | ICD-10-CM | POA: Diagnosis present

## 2020-08-09 DIAGNOSIS — C50411 Malignant neoplasm of upper-outer quadrant of right female breast: Secondary | ICD-10-CM | POA: Insufficient documentation

## 2020-08-09 DIAGNOSIS — Z483 Aftercare following surgery for neoplasm: Secondary | ICD-10-CM

## 2020-08-09 DIAGNOSIS — Z170 Estrogen receptor positive status [ER+]: Secondary | ICD-10-CM | POA: Diagnosis present

## 2020-08-09 DIAGNOSIS — R293 Abnormal posture: Secondary | ICD-10-CM | POA: Diagnosis present

## 2020-08-09 NOTE — Patient Instructions (Addendum)
            [facility]         [address], [place] [address]         [phone]         Annia Friendly, PT, CLT   After Breast Cancer Class It is recommended you attend the ABC class to be educated on lymphedema risk reduction. This class is free of charge and lasts for 1 hour. It is a 1-time class.  You are scheduled for December 20th at 11:00.  Scar massage Begin gentle scar massage to right breast and axillary incisions a few minutes each day.   Compression garment Wear a tight fitting compression or sports bra to reduce swelling in your breast.   Home exercise Program Continue doing your exercises until you can do them without feeling any tightness.   Follow up PT: It is recommended you return every 3 months for the first 3 years following surgery to be assessed on the SOZO machine for an L-Dex score. This helps prevent clinically significant lymphedema in 95% of patients. These follow up screens are 15 minute appointments that you are not billed for. You are scheduled for February 28th at 8:15 am.

## 2020-08-09 NOTE — Therapy (Signed)
[place], [place], [address] Phone: [phone]   Fax:  [phone]  Physical Therapy Treatment  Patient Details  Name: Autumn Bullock MRN: [id number] Date of Birth: [birth year] Referring Provider (PT): Dr. Donnie Mesa   Encounter Date: 08/09/2020   PT End of Session - 08/09/20 1051    Visit Number 2    Number of Visits 2    PT Start Time 1008    PT Stop Time 1051    PT Time Calculation (min) 43 min    Activity Tolerance Patient tolerated treatment well    Behavior During Therapy [facility] for tasks assessed/performed           Past Medical History:  Diagnosis Date  . Family history of uterine cancer 06/16/2020  . PONV (postoperative nausea and vomiting)     Past Surgical History:  Procedure Laterality Date  . BREAST LUMPECTOMY WITH RADIOACTIVE SEED AND SENTINEL LYMPH NODE BIOPSY Right 07/14/2020   Procedure: RIGHT BREAST LUMPECTOMY WITH BRACKETED RADIOACTIVE SEED AND SENTINEL LYMPH NODE BIOPSY, BLUE DYE INJECTION;  Surgeon: Donnie Mesa, MD;  Location: [place];  Service: General;  Laterality: Right;  PEC BLOCK, BLUE DYE INJECTION  . CESAREAN SECTION     2011  . PORTACATH PLACEMENT Right 07/14/2020   Procedure: INSERTION PORT-A-CATH WITH ULTRASOUND GUIDANCE;  Surgeon: Donnie Mesa, MD;  Location: [place];  Service: General;  Laterality: Right;  . RE-EXCISION OF BREAST LUMPECTOMY Right 08/03/2020   Procedure: RE-EXCISION OF RIGHT BREAST LUMPECTOMY SUPERIOR MARGINS;  Surgeon: Donnie Mesa, MD;  Location: [place];  Service: General;  Laterality: Right;  LMA    There were no vitals filed for this visit.   Subjective Assessment - 08/09/20 1010    Subjective Patient reports she underwent a right lumpectomy and sentinel node biopsy (1/1 with micromets) on 07/14/2020. She had a re-excision on 10/04/2019. She will undergo chemotherapy beginning  08/17/2020 and radiation followed by anti-estrogen therapy.    Pertinent History Patient was diagnosed on 06/02/2020 with right grade II invasive ductal carcinoma breast cancer. She underwent a right lumpectomy and sentinel node biopsy (1/1 with micromets) on 07/14/2020.  It is ER/PR positive and HER2 negative with a Ki67 of 5%.    Patient Stated Goals See how my arm is doing    Currently in Pain? No/denies              [facility] PT Assessment - 08/09/20 0001      Assessment   Medical Diagnosis s/p right lumpectomy and SLNB    Referring Provider (PT) Dr. Donnie Mesa    Onset Date/Surgical Date 07/14/20    Hand Dominance Right    Prior Therapy Baselines      Precautions   Precautions Other (comment)    Precaution Comments recent surgery; right arm lymphedema risk      Restrictions   Weight Bearing Restrictions No      Balance Screen   Has the patient fallen in the past 6 months No    Has the patient had a decrease in activity level because of a fear of falling?  No    Is the patient reluctant to leave their home because of a fear of falling?  No      Home Environment   Living Environment Private residence    Living Arrangements Spouse/significant other;Children   Husband and 43 y.o. daughter   Available Help at Discharge Family  Prior Function   Level of Independence Independent    Vocation Full time employment    Vocation Requirements Off until she completes the first 8 weeks of chemo    Leisure She is walking 2 miles each day      Cognition   Overall Cognitive Status Within Functional Limits for tasks assessed      Observation/Other Assessments   Observations Right breast incisions appear to be healing well with steri-strips present on right breast from surgery last week.  Edema present in right breast; addressed with applying chip pack.      Posture/Postural Control   Posture/Postural Control Postural limitations    Postural Limitations Rounded Shoulders;Forward  head      ROM / Strength   AROM / PROM / Strength AROM      AROM   AROM Assessment Site Shoulder    Right/Left Shoulder Right    Right Shoulder Extension 48 Degrees    Right Shoulder Flexion 149 Degrees    Right Shoulder ABduction 158 Degrees    Right Shoulder Internal Rotation 65 Degrees    Right Shoulder External Rotation 89 Degrees      Strength   Overall Strength Within functional limits for tasks performed             LYMPHEDEMA/ONCOLOGY QUESTIONNAIRE - 08/09/20 0001      Type   Cancer Type Right breast cancer      Surgeries   Lumpectomy Date 07/14/20    Sentinel Lymph Node Biopsy Date 07/14/20    Other Surgery Date 08/03/20    Number Lymph Nodes Removed 1      Treatment   Active Chemotherapy Treatment No    Past Chemotherapy Treatment No    Active Radiation Treatment No    Past Radiation Treatment No    Current Hormone Treatment No    Past Hormone Therapy No      What other symptoms do you have   Are you Having Heaviness or Tightness No    Are you having Pain No    Are you having pitting edema No    Is it Hard or Difficult finding clothes that fit No    Do you have infections No    Is there Decreased scar mobility Yes    Stemmer Sign No      Lymphedema Assessments   Lymphedema Assessments Upper extremities      Right Upper Extremity Lymphedema   10 cm Proximal to Olecranon Process 30.4 cm    Olecranon Process 25.5 cm    10 cm Proximal to Ulnar Styloid Process 19.8 cm    Just Proximal to Ulnar Styloid Process 14.5 cm    Across Hand at [employer] 18.8 cm    At [place] of 2nd Digit 5.9 cm      Left Upper Extremity Lymphedema   10 cm Proximal to Olecranon Process 30.7 cm    Olecranon Process 25.6 cm    10 cm Proximal to Ulnar Styloid Process 19.7 cm    Just Proximal to Ulnar Styloid Process 14 cm    Across Hand at [employer] 18.6 cm    At [place] of 2nd Digit 5.9 cm              Quick Dash - 08/09/20 0001    Open a tight or new jar No  difficulty    Do heavy household chores (wash walls, wash floors) Mild difficulty    Carry a shopping bag or  briefcase No difficulty    Wash your back No difficulty    Use a knife to cut food No difficulty    Recreational activities in which you take some force or impact through your arm, shoulder, or hand (golf, hammering, tennis) Mild difficulty    During the past week, to what extent has your arm, shoulder or hand problem interfered with your normal social activities with family, friends, neighbors, or groups? Not at all    During the past week, to what extent has your arm, shoulder or hand problem limited your work or other regular daily activities Not at all    Arm, shoulder, or hand pain. None    Tingling (pins and needles) in your arm, shoulder, or hand None    Difficulty Sleeping No difficulty    DASH Score 4.55 %                          PT Education - 08/09/20 1050    Education Details Lymphedema risk reduction and scar massage; aftercare    Person(s) Educated Patient    Methods Explanation;Handout    Comprehension Verbalized understanding;Returned demonstration               PT Long Term Goals - 08/09/20 1212      PT LONG TERM GOAL #1   Title Patient will demonstrate she has regained full shoulder ROM and function post operatively compared to baselines.    Time 8    Period Weeks    Status Achieved                 Plan - 08/09/20 1051    Clinical Impression Statement Patient is doing very well s/p right lumpectomy and sentinel node biopsy (1 node removed with micrometastases) on 07/14/2020. She underwent a re-excision on 10/04/2019 and will begin chemotherapy 08/17/2020. Her shoulder ROM is back to baseline and incisions are healing well. There is no sign of lymphedema. She will benefit from the chip pack issued to day to place in her bra to reduce edema. She also plans to wear a tighter fitting compression bra. She has no other PT needs at this  time but plans to attend the ABC class on 08/23/2020 for lymphedema risk reduction education.    PT Treatment/Interventions ADLs/Self Care Home Management;Therapeutic exercise;Patient/family education    PT Next Visit Plan D/C    PT Home Exercise Plan Post op shoulder ROM HEP    Consulted and Agree with Plan of Care Patient           Patient will benefit from skilled therapeutic intervention in order to improve the following deficits and impairments:  Postural dysfunction, Decreased range of motion, Pain, Impaired UE functional use, Decreased knowledge of precautions  Visit Diagnosis: Malignant neoplasm of upper-outer quadrant of right breast in female, estrogen receptor positive ([place])  Abnormal posture  Aftercare following surgery for neoplasm  Localized edema     Problem List Patient Active Problem List   Diagnosis Date Noted  . Genetic testing 06/23/2020  . Family history of uterine cancer 06/16/2020  . Malignant neoplasm of upper-outer quadrant of right breast in female, estrogen receptor positive ([place]) 06/11/2020    PHYSICAL THERAPY DISCHARGE SUMMARY  Visits from Start of Care: 2  Current functional level related to goals / functional outcomes: Goals met. See above for objective findings.   Remaining deficits: Edema right breast   Education / Equipment: HEP and lymphedema education Plan:  Patient agrees to discharge.  Patient goals were met. Patient is being discharged due to meeting the stated rehab goals.  ?????         Annia Friendly, [place] 08/09/20 12:14 PM  [place], [place], [address] Phone: [phone]   Fax:  [phone]  Name: Autumn Bullock MRN: [id number] Date of Birth: [birth year]

## 2020-08-09 NOTE — Progress Notes (Unsigned)
Received copy of pathology [id number] date 06/08/2020 showing invasive ductal carcinoma, grade 2, estrogen receptor 90% positive with strong intensity progesterone receptor 70% stage strong intensity and HER-2 negative by immunohistochemistry.  The second deeper biopsy was similar to the first.

## 2020-08-10 NOTE — Progress Notes (Signed)
Pharmacist Chemotherapy Monitoring - Initial Assessment    Anticipated start date: 08/17/20  Regimen:  . Are orders appropriate based on the patient's diagnosis, regimen, and cycle? Yes . Does the plan date match the patient's scheduled date? Yes . Is the sequencing of drugs appropriate? Yes . Are the premedications appropriate for the patient's regimen? Yes . Prior Authorization for treatment is: Approved o If applicable, is the correct biosimilar selected based on the patient's insurance? yes  Organ Function and Labs: Marland Kitchen Are dose adjustments needed based on the patient's renal function, hepatic function, or hematologic function? No . Are appropriate labs ordered prior to the start of patient's treatment? Yes . Other organ system assessment, if indicated: women of childbearing potential: pregnancy status  . The following baseline labs, if indicated, have been ordered: N/A  Dose Assessment: . Are the drug doses appropriate? Yes . Are the following correct: o Drug concentrations Yes o IV fluid compatible with drug Yes o Administration routes Yes o Timing of therapy Yes . If applicable, does the patient have documented access for treatment and/or plans for port-a-cath placement? yes . If applicable, have lifetime cumulative doses been properly documented and assessed? yes Lifetime Dose Tracking  No doses have been documented on this patient for the following tracked chemicals: Doxorubicin, Epirubicin, Idarubicin, Daunorubicin, Mitoxantrone, Bleomycin, Oxaliplatin, Carboplatin, Liposomal Doxorubicin  o   Toxicity Monitoring/Prevention: . The patient has the following take home antiemetics prescribed: Prochlorperazine, Dexamethasone and Lorazepam . The patient has the following take home medications prescribed: N/A . Medication allergies and previous infusion related reactions, if applicable, have been reviewed and addressed. Yes . The patient's current medication list has been assessed  for drug-drug interactions with their chemotherapy regimen. no significant drug-drug interactions were identified on review.  Order Review: . Are the treatment plan orders signed? Yes . Is the patient scheduled to see a provider prior to their treatment? Yes  I verify that I have reviewed each item in the above checklist and answered each question accordingly.  Kennith Center, Pharm.D., CPP 08/10/2020@4 :06 PM

## 2020-08-12 ENCOUNTER — Other Ambulatory Visit: Payer: Self-pay | Admitting: Oncology

## 2020-08-16 ENCOUNTER — Other Ambulatory Visit: Payer: Self-pay | Admitting: Nurse Practitioner

## 2020-08-16 DIAGNOSIS — Z170 Estrogen receptor positive status [ER+]: Secondary | ICD-10-CM

## 2020-08-16 DIAGNOSIS — C50411 Malignant neoplasm of upper-outer quadrant of right female breast: Secondary | ICD-10-CM

## 2020-08-16 NOTE — Progress Notes (Signed)
[place]   [phone] [phone]   Clinic Follow up Note   Patient Care Team: Brock Ra, PA-C as PCP - General Mauro Kaufmann, RN as Oncology Nurse Navigator Rockwell Germany, RN as Oncology Nurse Navigator Donnie Mesa, MD as Consulting Physician (General Surgery) Eppie Gibson, MD as Attending Physician (Radiation Oncology) Magrinat, Virgie Dad, MD as Consulting Physician (Oncology) Vanessa Kick, MD as Consulting Physician (Obstetrics and Gynecology) Lovett Calender, MD as Consulting Physician (Orthopedic Surgery) 08/17/2020  CHIEF COMPLAINT: Follow up right breast cancer   CURRENT THERAPY: S/p re-excision surgery 11/30 pending adjuvant chemotherapy AC-T  INTERVAL HISTORY: Autumn Bullock returns for follow up as scheduled. She underwent re-excision on 11/30 by Dr. Georgette Dover. She is here to start adjuvant chemo.  She has recovered well from recent surgery.  She has minimal right breast discomfort, no overt pain.  She has good movement and range of motion.  She had a skin reaction to one of the devices from PT but has since resolved.  Denies any fever, chills, cough, chest pain, dyspnea or new concerns.   MEDICAL HISTORY:  Past Medical History:  Diagnosis Date  . Family history of uterine cancer 06/16/2020  . PONV (postoperative nausea and vomiting)     SURGICAL HISTORY: Past Surgical History:  Procedure Laterality Date  . BREAST LUMPECTOMY WITH RADIOACTIVE SEED AND SENTINEL LYMPH NODE BIOPSY Right 07/14/2020   Procedure: RIGHT BREAST LUMPECTOMY WITH BRACKETED RADIOACTIVE SEED AND SENTINEL LYMPH NODE BIOPSY, BLUE DYE INJECTION;  Surgeon: Donnie Mesa, MD;  Location: [place];  Service: General;  Laterality: Right;  PEC BLOCK, BLUE DYE INJECTION  . CESAREAN SECTION     2011  . PORTACATH PLACEMENT Right 07/14/2020   Procedure: INSERTION PORT-A-CATH WITH ULTRASOUND GUIDANCE;  Surgeon: Donnie Mesa, MD;  Location: [place];  Service: General;  Laterality: Right;  . RE-EXCISION OF BREAST LUMPECTOMY Right 08/03/2020   Procedure: RE-EXCISION OF RIGHT BREAST LUMPECTOMY SUPERIOR MARGINS;  Surgeon: Donnie Mesa, MD;  Location: [place];  Service: General;  Laterality: Right;  LMA    I have reviewed the social history and family history with the patient and they are unchanged from previous note.  ALLERGIES:  is allergic to wound dressing adhesive.  MEDICATIONS:  Current Outpatient Medications  Medication Sig Dispense Refill  . dexamethasone (DECADRON) 4 MG tablet Take 2 tablets (8 mg total) by mouth daily. Take daily for 3 days after chemo. Take with food. 30 tablet 1  . ketoconazole (NIZORAL) 2 % cream Apply 1 application topically daily. 15 g 0  . lidocaine-prilocaine (EMLA) cream Apply to affected area once 30 g 3  . LORazepam (ATIVAN) 0.5 MG tablet Take 1 tablet (0.5 mg total) by mouth at bedtime as needed (Nausea or vomiting). 30 tablet 0  . ondansetron (ZOFRAN ODT) 4 MG disintegrating tablet Take 1 tablet (4 mg total) by mouth every 8 (eight) hours as needed for nausea or vomiting. 20 tablet 0  . prochlorperazine (COMPAZINE) 10 MG tablet Take 1 tablet (10 mg total) by mouth every 6 (six) hours as needed (Nausea or vomiting). 30 tablet 1  . oxyCODONE (OXY IR/ROXICODONE) 5 MG immediate release tablet Take 1 tablet (5 mg total) by mouth every 6 (six) hours as needed for severe pain. 20 tablet 0   No current facility-administered medications for this visit.   Facility-Administered Medications Ordered in Other Visits  Medication Dose Route Frequency Provider Last Rate Last Admin  .  sodium chloride flush (NS) 0.9 % injection 10 mL  10 mL Intracatheter PRN Magrinat, Virgie Dad, MD   10 mL at 08/17/20 1326    PHYSICAL EXAMINATION: ECOG PERFORMANCE STATUS: 0 - Asymptomatic  Vitals:   08/17/20 0928  BP: 125/75  Pulse: (!) 58  Resp: 16  Temp: (!) 97 F (36.1 C)  SpO2: 100%    Filed Weights   08/17/20 0928  Weight: 165 lb 12.8 oz (75.2 kg)    GENERAL:alert, no distress and comfortable SKIN: no rash  EYES:  sclera clear NECK: without mass LYMPH:  no palpable cervical or supraclavicular lymphadenopathy  LUNGS: clear with normal breathing effort HEART: regular rate & rhythm, no lower extremity edema NEURO: alert & oriented x 3 with fluent speech, no focal motor/sensory deficits PAC without erythema  Breast: breast are symmetric without nipple discharge or inversion. S/p right lumpectomy, axillary and areolar incisions are closed, healed. No erythema or drainage. No palpable mass in right breast that I could appreciate. Left breast exam deferred   LABORATORY DATA:  I have reviewed the data as listed CBC Latest Ref Rng & Units 08/17/2020 06/16/2020 01/08/2010  WBC 4.0 - 10.5 K/uL 5.6 6.0 20.1(H)  Hemoglobin 12.0 - 15.0 g/dL 12.6 13.5 10.9 DELTA CHECK NOTED REPEATED TO VERIFY(L)  Hematocrit 36.0 - 46.0 % 36.7 39.5 30.4(L)  Platelets 150 - 400 K/uL 222 263 220     CMP Latest Ref Rng & Units 08/17/2020 06/16/2020  Glucose 70 - 99 mg/dL 116(H) 123(H)  BUN 6 - 20 mg/dL 16 15  Creatinine 0.44 - 1.00 mg/dL 0.73 0.83  Sodium 135 - 145 mmol/L 137 140  Potassium 3.5 - 5.1 mmol/L 4.0 4.1  Chloride 98 - 111 mmol/L 109 104  CO2 22 - 32 mmol/L 24 30  Calcium 8.9 - 10.3 mg/dL 8.8(L) 9.5  Total Protein 6.5 - 8.1 g/dL 7.0 7.6  Total Bilirubin 0.3 - 1.2 mg/dL 0.7 0.4  Alkaline Phos 38 - 126 U/L 57 64  AST 15 - 41 U/L 18 18  ALT 0 - 44 U/L 18 15      RADIOGRAPHIC STUDIES: I have personally reviewed the radiological images as listed and agreed with the findings in the report. No results found.   ASSESSMENT & PLAN: 44 yo female   1. Malignant neoplasm of upper outer quadrant right breast, invasive and in situ ductal carcinoma, G2, ER/PR + HER2 - stage IIA -diagnosed 06/08/2020, s/p lumpectomy and SLN sampling 07/14/20 by Dr. Georgette Dover showed pT2pN1(mi) stage IIA  invasive ductal carcinoma, G2, with positive superior margin. mammaprint showed high risk with approx 93% 5 yr DFS with chemotherapy.  -genetics 06/22/20 negative except VUS in [facility] -S/p re-excision of positive margin by Dr. Georgette Dover on 11/30, final path showed no residual carcinoma. Recovered well -underwent PAC and baseline echo -To begin adjuvant chemotherapy AC-T today. The treatment plan includes adjuvant radiation and anti-estrogen to follow   Disposition:  Ms. Autumn Bullock appears stable. S/p re-excision of positive margin on 11/30 by Dr. Georgette Dover. We reviewed her final path which is negative for residual carcinoma, the final superior margin is free of tumor. She has recovered and healed well.  We again reviewed the potential chemo toxicities, symptom management, return/call precautions, and goal of care. She is ready to proceed. Labs reviewed. Begin cycle 1 AC today.   She will return for GCSF on 12/16 and nadir check next week with Dr. Jana Hakim. Then f/up in 2 weeks with cycle 2.    All  questions were answered. The patient knows to call the clinic with any problems, questions or concerns. No barriers to learning were detected. Total encounter time was 30 minutes.      Alla Feeling, NP 08/17/20

## 2020-08-17 ENCOUNTER — Encounter: Payer: Self-pay | Admitting: Nurse Practitioner

## 2020-08-17 ENCOUNTER — Inpatient Hospital Stay: Payer: BC Managed Care – PPO | Attending: Oncology

## 2020-08-17 ENCOUNTER — Inpatient Hospital Stay: Payer: BC Managed Care – PPO

## 2020-08-17 ENCOUNTER — Other Ambulatory Visit: Payer: BC Managed Care – PPO

## 2020-08-17 ENCOUNTER — Encounter: Payer: Self-pay | Admitting: *Deleted

## 2020-08-17 ENCOUNTER — Other Ambulatory Visit: Payer: Self-pay

## 2020-08-17 ENCOUNTER — Inpatient Hospital Stay: Payer: BC Managed Care – PPO | Admitting: Nurse Practitioner

## 2020-08-17 VITALS — BP 125/75 | HR 58 | Temp 97.0°F | Resp 16 | Ht 65.0 in | Wt 165.8 lb

## 2020-08-17 DIAGNOSIS — Z170 Estrogen receptor positive status [ER+]: Secondary | ICD-10-CM

## 2020-08-17 DIAGNOSIS — C50411 Malignant neoplasm of upper-outer quadrant of right female breast: Secondary | ICD-10-CM | POA: Diagnosis not present

## 2020-08-17 DIAGNOSIS — Z79899 Other long term (current) drug therapy: Secondary | ICD-10-CM | POA: Insufficient documentation

## 2020-08-17 DIAGNOSIS — D6481 Anemia due to antineoplastic chemotherapy: Secondary | ICD-10-CM | POA: Insufficient documentation

## 2020-08-17 DIAGNOSIS — Z5189 Encounter for other specified aftercare: Secondary | ICD-10-CM | POA: Diagnosis not present

## 2020-08-17 DIAGNOSIS — Z7952 Long term (current) use of systemic steroids: Secondary | ICD-10-CM | POA: Diagnosis not present

## 2020-08-17 DIAGNOSIS — Z95828 Presence of other vascular implants and grafts: Secondary | ICD-10-CM

## 2020-08-17 DIAGNOSIS — K5900 Constipation, unspecified: Secondary | ICD-10-CM | POA: Diagnosis not present

## 2020-08-17 DIAGNOSIS — Z5111 Encounter for antineoplastic chemotherapy: Secondary | ICD-10-CM | POA: Diagnosis not present

## 2020-08-17 DIAGNOSIS — T451X5A Adverse effect of antineoplastic and immunosuppressive drugs, initial encounter: Secondary | ICD-10-CM | POA: Insufficient documentation

## 2020-08-17 LAB — CMP (CANCER CENTER ONLY)
ALT: 18 U/L (ref 0–44)
AST: 18 U/L (ref 15–41)
Albumin: 3.8 g/dL (ref 3.5–5.0)
Alkaline Phosphatase: 57 U/L (ref 38–126)
Anion gap: 4 — ABNORMAL LOW (ref 5–15)
BUN: 16 mg/dL (ref 6–20)
CO2: 24 mmol/L (ref 22–32)
Calcium: 8.8 mg/dL — ABNORMAL LOW (ref 8.9–10.3)
Chloride: 109 mmol/L (ref 98–111)
Creatinine: 0.73 mg/dL (ref 0.44–1.00)
GFR, Estimated: >60 mL/min (ref >60)
Glucose, Bld: 116 mg/dL — ABNORMAL HIGH (ref 70–99)
Potassium: 4 mmol/L (ref 3.5–5.1)
Sodium: 137 mmol/L (ref 135–145)
Total Bilirubin: 0.7 mg/dL (ref 0.3–1.2)
Total Protein: 7 g/dL (ref 6.5–8.1)

## 2020-08-17 LAB — CBC WITH DIFFERENTIAL (CANCER CENTER ONLY)
Abs Immature Granulocytes: 0.01 10*3/uL (ref 0.00–0.07)
Basophils Absolute: 0 10*3/uL (ref 0.0–0.1)
Basophils Relative: 0 %
Eosinophils Absolute: 0.2 10*3/uL (ref 0.0–0.5)
Eosinophils Relative: 3 %
HCT: 36.7 % (ref 36.0–46.0)
Hemoglobin: 12.6 g/dL (ref 12.0–15.0)
Immature Granulocytes: 0 %
Lymphocytes Relative: 31 %
Lymphs Abs: 1.7 10*3/uL (ref 0.7–4.0)
MCH: 32.9 pg (ref 26.0–34.0)
MCHC: 34.3 g/dL (ref 30.0–36.0)
MCV: 95.8 fL (ref 80.0–100.0)
Monocytes Absolute: 0.5 10*3/uL (ref 0.1–1.0)
Monocytes Relative: 9 %
Neutro Abs: 3.2 10*3/uL (ref 1.7–7.7)
Neutrophils Relative %: 57 %
Platelet Count: 222 10*3/uL (ref 150–400)
RBC: 3.83 MIL/uL — ABNORMAL LOW (ref 3.87–5.11)
RDW: 12.2 % (ref 11.5–15.5)
WBC Count: 5.6 10*3/uL (ref 4.0–10.5)
nRBC: 0 % (ref 0.0–0.2)

## 2020-08-17 LAB — PREGNANCY, URINE: Preg Test, Ur: NEGATIVE

## 2020-08-17 MED ORDER — SODIUM CHLORIDE 0.9% FLUSH
10.0000 mL | INTRAVENOUS | Status: DC | PRN
  Administered 2020-08-17: 10 mL
  Filled 2020-08-17: qty 10

## 2020-08-17 MED ORDER — SODIUM CHLORIDE 0.9% FLUSH
10.0000 mL | INTRAVENOUS | Status: DC | PRN
  Filled 2020-08-17: qty 10

## 2020-08-17 MED ORDER — SODIUM CHLORIDE 0.9 % IV SOLN
10.0000 mg | Freq: Once | INTRAVENOUS | Status: AC
  Administered 2020-08-17: 10 mg via INTRAVENOUS
  Filled 2020-08-17: qty 10

## 2020-08-17 MED ORDER — DOXORUBICIN HCL CHEMO IV INJECTION 2 MG/ML
60.0000 mg/m2 | Freq: Once | INTRAVENOUS | Status: AC
  Administered 2020-08-17: 110 mg via INTRAVENOUS
  Filled 2020-08-17: qty 55

## 2020-08-17 MED ORDER — SODIUM CHLORIDE 0.9 % IV SOLN
600.0000 mg/m2 | Freq: Once | INTRAVENOUS | Status: AC
  Administered 2020-08-17: 1100 mg via INTRAVENOUS
  Filled 2020-08-17: qty 55

## 2020-08-17 MED ORDER — SODIUM CHLORIDE 0.9 % IV SOLN
Freq: Once | INTRAVENOUS | Status: AC
  Filled 2020-08-17: qty 250

## 2020-08-17 MED ORDER — SODIUM CHLORIDE 0.9 % IV SOLN
150.0000 mg | Freq: Once | INTRAVENOUS | Status: AC
  Administered 2020-08-17: 150 mg via INTRAVENOUS
  Filled 2020-08-17: qty 150

## 2020-08-17 MED ORDER — PALONOSETRON HCL INJECTION 0.25 MG/5ML
0.2500 mg | Freq: Once | INTRAVENOUS | Status: AC
  Administered 2020-08-17: 0.25 mg via INTRAVENOUS

## 2020-08-17 MED ORDER — HEPARIN SOD (PORK) LOCK FLUSH 100 UNIT/ML IV SOLN
500.0000 [IU] | Freq: Once | INTRAVENOUS | Status: AC | PRN
  Administered 2020-08-17: 500 [IU]
  Filled 2020-08-17: qty 5

## 2020-08-17 MED ORDER — PALONOSETRON HCL INJECTION 0.25 MG/5ML
INTRAVENOUS | Status: AC
  Filled 2020-08-17: qty 5

## 2020-08-17 NOTE — Progress Notes (Signed)
Nutrition Assessment   Reason for Assessment:   Patient identified by attending Breast Clinic   ASSESSMENT:  44 year old female with new diagnosis of breast cancer.  Patient s/p right lumpectomy on 11/10, re-excision on 11/20 for positive margins.  Patient receiving adjuvant chemotherapy.   Met with patient during infusion.  Patient reports good appetite.  Likes variety of foods. Denies any nutrition impact symptoms    Medications: decadron, compazine, zofran, ativan   Labs: reviewed   Anthropometrics:   Height: 65 inches Weight: 165 lb 12.8 oz today 158 lb on 06/16/20 BMI: 27   NUTRITION DIAGNOSIS: none at this time   INTERVENTION:  Discussed importance of good nutrition during treatment.  Encouraged well balanced diet including vegetables, fruits, whole grains and lean protein.   Provided handout on nausea and foods to choose should she have any of those symptoms with treatment.  Contact information provided   Next Visit: no follow-up, RD available if needed in the future  Joli B. Allen, RD, LDN Registered Dietitian [phone] (mobile)        

## 2020-08-17 NOTE — Patient Instructions (Signed)
Doxorubicin injection What is this medicine? DOXORUBICIN (dox oh ROO bi sin) is a chemotherapy drug. It is used to treat many kinds of cancer like leukemia, lymphoma, neuroblastoma, sarcoma, and Wilms' tumor. It is also used to treat bladder cancer, breast cancer, lung cancer, ovarian cancer, stomach cancer, and thyroid cancer. This medicine may be used for other purposes; ask your health care provider or pharmacist if you have questions. COMMON BRAND NAME(S): Adriamycin, Adriamycin PFS, Adriamycin RDF, Rubex What should I tell my health care provider before I take this medicine? They need to know if you have any of these conditions:  heart disease  history of low blood counts caused by a medicine  liver disease  recent or ongoing radiation therapy  an unusual or allergic reaction to doxorubicin, other chemotherapy agents, other medicines, foods, dyes, or preservatives  pregnant or trying to get pregnant  breast-feeding How should I use this medicine? This drug is given as an infusion into a vein. It is administered in a hospital or clinic by a specially trained health care professional. If you have pain, swelling, burning or any unusual feeling around the site of your injection, tell your health care professional right away. Talk to your pediatrician regarding the use of this medicine in children. Special care may be needed. Overdosage: If you think you have taken too much of this medicine contact a poison control center or emergency room at once. NOTE: This medicine is only for you. Do not share this medicine with others. What if I miss a dose? It is important not to miss your dose. Call your doctor or health care professional if you are unable to keep an appointment. What may interact with this medicine? This medicine may interact with the following medications:  6-mercaptopurine  paclitaxel  phenytoin  St. John's Wort  trastuzumab  verapamil This list may not describe  all possible interactions. Give your health care provider a list of all the medicines, herbs, non-prescription drugs, or dietary supplements you use. Also tell them if you smoke, drink alcohol, or use illegal drugs. Some items may interact with your medicine. What should I watch for while using this medicine? This drug may make you feel generally unwell. This is not uncommon, as chemotherapy can affect healthy cells as well as cancer cells. Report any side effects. Continue your course of treatment even though you feel ill unless your doctor tells you to stop. There is a maximum amount of this medicine you should receive throughout your life. The amount depends on the medical condition being treated and your overall health. Your doctor will watch how much of this medicine you receive in your lifetime. Tell your doctor if you have taken this medicine before. You may need blood work done while you are taking this medicine. Your urine may turn red for a few days after your dose. This is not blood. If your urine is dark or brown, call your doctor. In some cases, you may be given additional medicines to help with side effects. Follow all directions for their use. Call your doctor or health care professional for advice if you get a fever, chills or sore throat, or other symptoms of a cold or flu. Do not treat yourself. This drug decreases your body's ability to fight infections. Try to avoid being around people who are sick. This medicine may increase your risk to bruise or bleed. Call your doctor or health care professional if you notice any unusual bleeding. Talk to your doctor   about your risk of cancer. You may be more at risk for certain types of cancers if you take this medicine. Do not become pregnant while taking this medicine or for 6 months after stopping it. Women should inform their doctor if they wish to become pregnant or think they might be pregnant. Men should not father a child while taking this  medicine and for 6 months after stopping it. There is a potential for serious side effects to an unborn child. Talk to your health care professional or pharmacist for more information. Do not breast-feed an infant while taking this medicine. This medicine has caused ovarian failure in some women and reduced sperm counts in some men This medicine may interfere with the ability to have a child. Talk with your doctor or health care professional if you are concerned about your fertility. This medicine may cause a decrease in Co-Enzyme Q-10. You should make sure that you get enough Co-Enzyme Q-10 while you are taking this medicine. Discuss the foods you eat and the vitamins you take with your health care professional. What side effects may I notice from receiving this medicine? Side effects that you should report to your doctor or health care professional as soon as possible:  allergic reactions like skin rash, itching or hives, swelling of the face, lips, or tongue  breathing problems  chest pain  fast or irregular heartbeat  low blood counts - this medicine may decrease the number of white blood cells, red blood cells and platelets. You may be at increased risk for infections and bleeding.  pain, redness, or irritation at site where injected  signs of infection - fever or chills, cough, sore throat, pain or difficulty passing urine  signs of decreased platelets or bleeding - bruising, pinpoint red spots on the skin, black, tarry stools, blood in the urine  swelling of the ankles, feet, hands  tiredness  weakness Side effects that usually do not require medical attention (report to your doctor or health care professional if they continue or are bothersome):  diarrhea  hair loss  mouth sores  nail discoloration or damage  nausea  red colored urine  vomiting This list may not describe all possible side effects. Call your doctor for medical advice about side effects. You may report  side effects to FDA at 1-800-FDA-1088. Where should I keep my medicine? This drug is given in a hospital or clinic and will not be stored at home. NOTE: This sheet is a summary. It may not cover all possible information. If you have questions about this medicine, talk to your doctor, pharmacist, or health care provider.  2020 [employer] Standard (2017-04-04 11:01:26) Cyclophosphamide Injection What is this medicine? CYCLOPHOSPHAMIDE (sye kloe FOSS fa mide) is a chemotherapy drug. It slows the growth of cancer cells. This medicine is used to treat many types of cancer like lymphoma, myeloma, leukemia, breast cancer, and ovarian cancer, to name a few. This medicine may be used for other purposes; ask your health care provider or pharmacist if you have questions. COMMON BRAND NAME(S): Cytoxan, Neosar What should I tell my health care provider before I take this medicine? They need to know if you have any of these conditions:  heart disease  history of irregular heartbeat  infection  kidney disease  liver disease  low blood counts, like white cells, platelets, or red blood cells  on hemodialysis  recent or ongoing radiation therapy  scarring or thickening of the lungs  trouble passing urine  an   unusual or allergic reaction to cyclophosphamide, other medicines, foods, dyes, or preservatives  pregnant or trying to get pregnant  breast-feeding How should I use this medicine? This drug is usually given as an injection into a vein or muscle or by infusion into a vein. It is administered in a hospital or clinic by a specially trained health care professional. Talk to your pediatrician regarding the use of this medicine in children. Special care may be needed. Overdosage: If you think you have taken too much of this medicine contact a poison control center or emergency room at once. NOTE: This medicine is only for you. Do not share this medicine with others. What if I miss a  dose? It is important not to miss your dose. Call your doctor or health care professional if you are unable to keep an appointment. What may interact with this medicine?  amphotericin B  azathioprine  certain antivirals for HIV or hepatitis  certain medicines for blood pressure, heart disease, irregular heart beat  certain medicines that treat or prevent blood clots like warfarin  certain other medicines for cancer  cyclosporine  etanercept  indomethacin  medicines that relax muscles for surgery  medicines to increase blood counts  metronidazole This list may not describe all possible interactions. Give your health care provider a list of all the medicines, herbs, non-prescription drugs, or dietary supplements you use. Also tell them if you smoke, drink alcohol, or use illegal drugs. Some items may interact with your medicine. What should I watch for while using this medicine? Your condition will be monitored carefully while you are receiving this medicine. You may need blood work done while you are taking this medicine. Drink water or other fluids as directed. Urinate often, even at night. Some products may contain alcohol. Ask your health care professional if this medicine contains alcohol. Be sure to tell all health care professionals you are taking this medicine. Certain medicines, like metronidazole and disulfiram, can cause an unpleasant reaction when taken with alcohol. The reaction includes flushing, headache, nausea, vomiting, sweating, and increased thirst. The reaction can last from 30 minutes to several hours. Do not become pregnant while taking this medicine or for 1 year after stopping it. Women should inform their health care professional if they wish to become pregnant or think they might be pregnant. Men should not father a child while taking this medicine and for 4 months after stopping it. There is potential for serious side effects to an unborn child. Talk to your  health care professional for more information. Do not breast-feed an infant while taking this medicine or for 1 week after stopping it. This medicine has caused ovarian failure in some women. This medicine may make it more difficult to get pregnant. Talk to your health care professional if you are concerned about your fertility. This medicine has caused decreased sperm counts in some men. This may make it more difficult to father a child. Talk to your health care professional if you are concerned about your fertility. Call your health care professional for advice if you get a fever, chills, or sore throat, or other symptoms of a cold or flu. Do not treat yourself. This medicine decreases your body's ability to fight infections. Try to avoid being around people who are sick. Avoid taking medicines that contain aspirin, acetaminophen, ibuprofen, naproxen, or ketoprofen unless instructed by your health care professional. These medicines may hide a fever. Talk to your health care professional about your risk of cancer.   You may be more at risk for certain types of cancer if you take this medicine. If you are going to need surgery or other procedure, tell your health care professional that you are using this medicine. Be careful brushing or flossing your teeth or using a toothpick because you may get an infection or bleed more easily. If you have any dental work done, tell your dentist you are receiving this medicine. What side effects may I notice from receiving this medicine? Side effects that you should report to your doctor or health care professional as soon as possible:  allergic reactions like skin rash, itching or hives, swelling of the face, lips, or tongue  breathing problems  nausea, vomiting  signs and symptoms of bleeding such as bloody or black, tarry stools; red or dark brown urine; spitting up blood or brown material that looks like coffee grounds; red spots on the skin; unusual bruising  or bleeding from the eyes, gums, or nose  signs and symptoms of heart failure like fast, irregular heartbeat, sudden weight gain; swelling of the ankles, feet, hands  signs and symptoms of infection like fever; chills; cough; sore throat; pain or trouble passing urine  signs and symptoms of kidney injury like trouble passing urine or change in the amount of urine  signs and symptoms of liver injury like dark yellow or brown urine; general ill feeling or flu-like symptoms; light-colored stools; loss of appetite; nausea; right upper belly pain; unusually weak or tired; yellowing of the eyes or skin Side effects that usually do not require medical attention (report to your doctor or health care professional if they continue or are bothersome):  confusion  decreased hearing  diarrhea  facial flushing  hair loss  headache  loss of appetite  missed menstrual periods  signs and symptoms of low red blood cells or anemia such as unusually weak or tired; feeling faint or lightheaded; falls  skin discoloration This list may not describe all possible side effects. Call your doctor for medical advice about side effects. You may report side effects to FDA at 1-800-FDA-1088. Where should I keep my medicine? This drug is given in a hospital or clinic and will not be stored at home. NOTE: This sheet is a summary. It may not cover all possible information. If you have questions about this medicine, talk to your doctor, pharmacist, or health care provider.  2020 [employer]/Gold Standard (2019-05-26 09:53:29)  

## 2020-08-19 ENCOUNTER — Other Ambulatory Visit: Payer: Self-pay

## 2020-08-19 ENCOUNTER — Inpatient Hospital Stay: Payer: BC Managed Care – PPO

## 2020-08-19 VITALS — BP 113/71 | HR 54 | Temp 98.9°F | Resp 18

## 2020-08-19 DIAGNOSIS — C50411 Malignant neoplasm of upper-outer quadrant of right female breast: Secondary | ICD-10-CM

## 2020-08-19 DIAGNOSIS — Z5111 Encounter for antineoplastic chemotherapy: Secondary | ICD-10-CM | POA: Diagnosis not present

## 2020-08-19 DIAGNOSIS — Z170 Estrogen receptor positive status [ER+]: Secondary | ICD-10-CM

## 2020-08-19 MED ORDER — PEGFILGRASTIM-CBQV 6 MG/0.6ML ~~LOC~~ SOSY
6.0000 mg | PREFILLED_SYRINGE | Freq: Once | SUBCUTANEOUS | Status: AC
  Administered 2020-08-19: 6 mg via SUBCUTANEOUS

## 2020-08-19 MED ORDER — PEGFILGRASTIM-CBQV 6 MG/0.6ML ~~LOC~~ SOSY
PREFILLED_SYRINGE | SUBCUTANEOUS | Status: AC
  Filled 2020-08-19: qty 0.6

## 2020-08-19 NOTE — Patient Instructions (Signed)

## 2020-08-23 NOTE — Progress Notes (Signed)
[place]  Telephone:([phone] Fax:([phone]) [phone]     ID: MAHRUKH SEGUIN DOB: 1976-02-01  MR#: [id number]  AJO#:[id number]  Patient Care Team: Brock Ra, PA-C as PCP - General Mauro Kaufmann, RN as Oncology Nurse Navigator Rockwell Germany, RN as Oncology Nurse Navigator Donnie Mesa, MD as Consulting Physician (General Surgery) Eppie Gibson, MD as Attending Physician (Radiation Oncology) Magrinat, Virgie Dad, MD as Consulting Physician (Oncology) Vanessa Kick, MD as Consulting Physician (Obstetrics and Gynecology) Lovett Calender, MD as Consulting Physician (Orthopedic Surgery) Chauncey Cruel, MD OTHER MD:  CHIEF COMPLAINT: Estrogen receptor positive breast cancer  CURRENT TREATMENT: Adjuvant chemotherapy   INTERVAL HISTORY: Autumn Bullock returns today for follow up of her estrogen receptor positive breast cancer accompanied by her husband.   She began adjuvant chemotherapy, consisting of adriamycin and cytoxan, at her last visit on 08/17/2020. Today is day 8 cycle 1.  This is to be followed by weekly Taxol x12   REVIEW OF SYSTEMS: Zyion felt a little strange for the first 2 or 3 days of chemo, likely due to steroids.  She actually slept okay.  She did have some blurred vision.  Then on days 5 and 6 she has significant diarrhea and nausea and vomiting.  She started taking Zofran alternating with Compazine.  She was able to keep her self well-hydrated, adding a little Gatorade to the water since the water alone tasted horrible to her.  Her urine remained clear and was frequent.  She also developed some headaches.  She is still nauseated and she is still taking Compazine and Zofran.  She also has significant reflux issues.  Even though I would consider this all very difficult she says that overall it went "good".   COVID 19 VACCINATION STATUS: Status post Pfizer x2, most recent treatment April 2021.  Patient also was diagnosed with COVID-19 disease November  2020   HISTORY OF CURRENT ILLNESS: From the original intake note:  Autumn Bullock had routine screening mammography on 06/02/2020 showing a possible abnormality in the right breast. She underwent right diagnostic mammography with tomography and right breast ultrasonography at [facility] on 06/08/2020 showing: breast density category C; 2.3 cm right breast mass at 11:30; 0.8 cm mass superficial to dominant mass; no right axillary adenopathy.  Accordingly on 06/08/2020 she proceeded to biopsy of the right breast area in question. The pathology from this procedure ([id number]) showed: invasive and in situ mammary carcinoma, grade 2, e-cadherin positive. Both biopsied masses showed this and were found to be morphologically similar. Prognostic indicators significant for: estrogen receptor, 90% positive and progesterone receptor, 70% positive, both with strong staining intensity. Proliferation marker Ki67 at 5%. HER2 negative by immunohistochemistry (1+).  The patient's subsequent history is as detailed below.   PAST MEDICAL HISTORY: Past Medical History:  Diagnosis Date  . Family history of uterine cancer 06/16/2020  . PONV (postoperative nausea and vomiting)     PAST SURGICAL HISTORY: Past Surgical History:  Procedure Laterality Date  . BREAST LUMPECTOMY WITH RADIOACTIVE SEED AND SENTINEL LYMPH NODE BIOPSY Right 07/14/2020   Procedure: RIGHT BREAST LUMPECTOMY WITH BRACKETED RADIOACTIVE SEED AND SENTINEL LYMPH NODE BIOPSY, BLUE DYE INJECTION;  Surgeon: Donnie Mesa, MD;  Location: [facility];  Service: General;  Laterality: Right;  PEC BLOCK, BLUE DYE INJECTION  . CESAREAN SECTION     2011  . PORTACATH PLACEMENT Right 07/14/2020   Procedure: INSERTION PORT-A-CATH WITH ULTRASOUND GUIDANCE;  Surgeon: Donnie Mesa, MD;  Location: [facility];  Service: General;  Laterality: Right;  . RE-EXCISION OF BREAST LUMPECTOMY Right 08/03/2020   Procedure: RE-EXCISION OF  RIGHT BREAST LUMPECTOMY SUPERIOR MARGINS;  Surgeon: Donnie Mesa, MD;  Location: [facility];  Service: General;  Laterality: Right;  LMA    FAMILY HISTORY: Family History  Problem Relation Age of Onset  . Cancer Maternal Grandmother 81       Unknown GYN.  ?Uterine  As of October 2021 her parents are both living, her father at age 5 and her mother at 4, . [name] has 1 brother and 2 sisters. She reports uterine cancer in her maternal grandmother at age 22. There is no family history of breast, ovarian, or colon cancer to her knowledge.   GYNECOLOGIC HISTORY:  Patient's last menstrual period was 07/30/2020. Menarche: 44 years old Age at first live birth: 44 years old [place] P 1 LMP 05/05/2020, regular monthly periods, lasting 3 days with 1-2 heavy days Contraceptive: has used for 15 years, no complications HRT n/a  Hysterectomy? no BSO? 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 Husband [name] works in Nature conservation officer. She lives at home with [name] and their daughter [name], age 14. She attends [employer] in [place].    ADVANCED DIRECTIVES: In the absence of any documentation to the contrary, the patient's spouse is their HCPOA.    HEALTH MAINTENANCE: Social History   Tobacco Use  . Smoking status: Never Smoker  . Smokeless tobacco: Never Used  Substance Use Topics  . Alcohol use: Yes    Comment: 7/week  . Drug use: Never     Colonoscopy: 1999?  PAP: 05/31/2020  Bone density: never done   Allergies  Allergen Reactions  . Wound Dressing Adhesive     Tape etc     Current Outpatient Medications  Medication Sig Dispense Refill  . dexamethasone (DECADRON) 4 MG tablet Take 2 tablets (8 mg total) by mouth daily. Take daily for 3 days after chemo. Take with food. 30 tablet 1  . ketoconazole (NIZORAL) 2 % cream Apply 1 application topically daily. 15 g 0  . lidocaine-prilocaine (EMLA)  cream Apply to affected area once 30 g 3  . LORazepam (ATIVAN) 0.5 MG tablet Take 1 tablet (0.5 mg total) by mouth at bedtime as needed (Nausea or vomiting). 30 tablet 0  . ondansetron (ZOFRAN ODT) 4 MG disintegrating tablet Take 1 tablet (4 mg total) by mouth every 8 (eight) hours as needed for nausea or vomiting. 20 tablet 0  . oxyCODONE (OXY IR/ROXICODONE) 5 MG immediate release tablet Take 1 tablet (5 mg total) by mouth every 6 (six) hours as needed for severe pain. 20 tablet 0  . prochlorperazine (COMPAZINE) 10 MG tablet Take 1 tablet (10 mg total) by mouth every 6 (six) hours as needed (Nausea or vomiting). 30 tablet 1   No current facility-administered medications for this visit.    OBJECTIVE: White woman who appears well  There were no vitals filed for this visit.   There is no height or weight on file to calculate BMI.   Wt Readings from Last 3 Encounters:  08/17/20 165 lb 12.8 oz (75.2 kg)  08/03/20 163 lb 12.8 oz (74.3 kg)  07/23/20 157 lb 3.2 oz (71.3 kg)      ECOG FS:0 - Asymptomatic  Sclerae unicteric, EOMs intact Wearing a mask No cervical or supraclavicular adenopathy Lungs no rales or rhonchi Heart regular rate and rhythm  Abd soft, nontender, positive bowel sounds MSK no focal spinal tenderness, no upper extremity lymphedema Neuro: nonfocal, well oriented, appropriate affect Breasts: Deferred  LAB RESULTS:  CMP     Component Value Date/Time   NA 137 08/17/2020 0905   K 4.0 08/17/2020 0905   CL 109 08/17/2020 0905   CO2 24 08/17/2020 0905   GLUCOSE 116 (H) 08/17/2020 0905   BUN 16 08/17/2020 0905   CREATININE 0.73 08/17/2020 0905   CALCIUM 8.8 (L) 08/17/2020 0905   PROT 7.0 08/17/2020 0905   ALBUMIN 3.8 08/17/2020 0905   AST 18 08/17/2020 0905   ALT 18 08/17/2020 0905   ALKPHOS 57 08/17/2020 0905   BILITOT 0.7 08/17/2020 0905   GFRNONAA >60 08/17/2020 0905    No results found for: TOTALPROTELP, ALBUMINELP, A1GS, A2GS, BETS, BETA2SER, GAMS, MSPIKE,  SPEI  Lab Results  Component Value Date   WBC 5.6 08/17/2020   NEUTROABS 3.2 08/17/2020   HGB 12.6 08/17/2020   HCT 36.7 08/17/2020   MCV 95.8 08/17/2020   PLT 222 08/17/2020    No results found for: LABCA2  No components found for: GDJMEQ683  No results for input(s): INR in the last 168 hours.  No results found for: LABCA2  No results found for: MHD622  No results found for: WLN989  No results found for: QJJ941  No results found for: CA2729  No components found for: HGQUANT  No results found for: CEA1 / No results found for: CEA1   No results found for: AFPTUMOR  No results found for: CHROMOGRNA  No results found for: KPAFRELGTCHN, LAMBDASER, KAPLAMBRATIO (kappa/lambda light chains)  No results found for: HGBA, HGBA2QUANT, HGBFQUANT, HGBSQUAN (Hemoglobinopathy evaluation)   No results found for: LDH  No results found for: IRON, TIBC, IRONPCTSAT (Iron and TIBC)  No results found for: FERRITIN  Urinalysis No results found for: COLORURINE, APPEARANCEUR, LABSPEC, PHURINE, GLUCOSEU, HGBUR, BILIRUBINUR, KETONESUR, PROTEINUR, UROBILINOGEN, NITRITE, LEUKOCYTESUR   STUDIES: No results found.   ELIGIBLE FOR AVAILABLE RESEARCH PROTOCOL: AET  ASSESSMENT: 44 y.o. [place] woman status post right breast upper outer quadrant biopsy 06/08/2020 for a clinical mT2 N0, stage IB invasive ductal carcinoma, grade 2, estrogen and progesterone receptor positive, HER-2 not amplified, with an MIB-1 of 5%.  (1) genetics testing 06/22/2020 through the Common Hereditary Cancers Panel offered by [facility] found no deleterious mutations in APC, ATM, AXIN2, BARD1, BMPR1A, BRCA1, BRCA2, BRIP1, CDH1, CDK4, CDKN2A (p14ARF), CDKN2A (p16INK4a), CHEK2, CTNNA1, DICER1, EPCAM (Deletion/duplication testing only), GREM1 (promoter region deletion/duplication testing only), KIT, MEN1, MLH1, MSH2, MSH3, MSH6, MUTYH, NBN, NF1, NHTL1, PALB2, PDGFRA, PMS2, POLD1, POLE, PTEN, RAD50, RAD51C, RAD51D,  RNF43, SDHB, SDHC, SDHD, SMAD4, SMARCA4. STK11, TP53, TSC1, TSC2, and VHL.  The following genes were evaluated for sequence changes only: SDHA and HOXB13 c.251G>A variant only.  (a)  a variant of uncertain significance in MSH3 at c.2731T>G (p.Leu911Val).   (2) right lumpectomy and sentinel lymph node sampling 07/14/2020 showed a pT2 pN1(mic), stage IIA invasive ductal carcinoma, grade 2, with a positive superior margin  (a) additional surgery 08/03/2020  (3) MammaPrint obtained from the initial biopsy showed high risk, predicting a 93% 5-year disease-free survival with chemotherapy and significant benefit from chemotherapy (greater than 12%).  (4) cyclophosphamide and doxorubicin in dose dense fashion x4 to start 08/03/2020, to be followed by paclitaxel weekly x12  (a) echo 07/09/2020 shows an ejection fraction in the 60-65% range.  (5) adjuvant radiation to follow  (6) antiestrogens to start at the completion of local  treatment   PLAN: Willona has a very positive attitude.  I do think we can do a little bit better with cycle #2.  We are starting omeprazole 40 mg at bedtime now and she will continue to take it nightly for the next 2weeks.  Otherwise I do not think she will benefit from intravenous fluids as she was able to keep her self well-hydrated.  She will not take MiraLAX at this time.  Hopefully with these few changes she will do better with the second cycle.  She had been scheduled for labs today at 1230 but for some reason that was not down on the lab schedule and so they did not draw them.  They are going to draw them a little later today but I do not have those results yet.  I will call her with those results  Otherwise she is already scheduled to return 08/31/2020 for cycle #2.  She knows to call for any other issue that may develop before then   Encounter time 25 minutes.Sarajane Jews C. Magrinat, MD 08/24/2020 12:47 PM Medical Oncology and Hematology [facility], [place] [address] Tel. [phone]    Fax. [phone]   This document serves as a record of services personally performed by Lurline Del, MD. It was created on his behalf by Wilburn Mylar, a trained medical scribe. The creation of this record is based on the scribe's personal observations and the provider's statements to them.   I, Lurline Del MD, have reviewed the above documentation for accuracy and completeness, and I agree with the above.   *Total Encounter Time as defined by the Centers for Medicare and Medicaid Services includes, in addition to the face-to-face time of a patient visit (documented in the note above) non-face-to-face time: obtaining and reviewing outside history, ordering and reviewing medications, tests or procedures, care coordination (communications with other health care professionals or caregivers) and documentation in the medical record.

## 2020-08-24 ENCOUNTER — Other Ambulatory Visit: Payer: Self-pay | Admitting: Oncology

## 2020-08-24 ENCOUNTER — Inpatient Hospital Stay: Payer: BC Managed Care – PPO

## 2020-08-24 ENCOUNTER — Inpatient Hospital Stay (HOSPITAL_BASED_OUTPATIENT_CLINIC_OR_DEPARTMENT_OTHER): Payer: BC Managed Care – PPO | Admitting: Oncology

## 2020-08-24 ENCOUNTER — Encounter: Payer: Self-pay | Admitting: *Deleted

## 2020-08-24 ENCOUNTER — Other Ambulatory Visit: Payer: Self-pay

## 2020-08-24 VITALS — BP 122/67 | HR 72 | Temp 97.8°F | Resp 18 | Ht 65.0 in | Wt 157.1 lb

## 2020-08-24 DIAGNOSIS — Z170 Estrogen receptor positive status [ER+]: Secondary | ICD-10-CM

## 2020-08-24 DIAGNOSIS — C50411 Malignant neoplasm of upper-outer quadrant of right female breast: Secondary | ICD-10-CM | POA: Diagnosis not present

## 2020-08-24 DIAGNOSIS — Z5111 Encounter for antineoplastic chemotherapy: Secondary | ICD-10-CM | POA: Diagnosis not present

## 2020-08-24 LAB — CBC WITH DIFFERENTIAL/PLATELET
Abs Immature Granulocytes: 0.17 10*3/uL — ABNORMAL HIGH (ref 0.00–0.07)
Basophils Absolute: 0.1 10*3/uL (ref 0.0–0.1)
Basophils Relative: 2 %
Eosinophils Absolute: 0.1 10*3/uL (ref 0.0–0.5)
Eosinophils Relative: 4 %
HCT: 36.7 % (ref 36.0–46.0)
Hemoglobin: 12.8 g/dL (ref 12.0–15.0)
Immature Granulocytes: 7 %
Lymphocytes Relative: 48 %
Lymphs Abs: 1.2 10*3/uL (ref 0.7–4.0)
MCH: 33.4 pg (ref 26.0–34.0)
MCHC: 34.9 g/dL (ref 30.0–36.0)
MCV: 95.8 fL (ref 80.0–100.0)
Monocytes Absolute: 0.4 10*3/uL (ref 0.1–1.0)
Monocytes Relative: 16 %
Neutro Abs: 0.6 10*3/uL — ABNORMAL LOW (ref 1.7–7.7)
Neutrophils Relative %: 23 %
Platelets: 176 10*3/uL (ref 150–400)
RBC: 3.83 MIL/uL — ABNORMAL LOW (ref 3.87–5.11)
RDW: 11.7 % (ref 11.5–15.5)
WBC: 2.5 10*3/uL — ABNORMAL LOW (ref 4.0–10.5)
nRBC: 0 % (ref 0.0–0.2)

## 2020-08-24 LAB — COMPREHENSIVE METABOLIC PANEL
ALT: 14 U/L (ref 0–44)
AST: 11 U/L — ABNORMAL LOW (ref 15–41)
Albumin: 4.2 g/dL (ref 3.5–5.0)
Alkaline Phosphatase: 72 U/L (ref 38–126)
Anion gap: 7 (ref 5–15)
BUN: 13 mg/dL (ref 6–20)
CO2: 27 mmol/L (ref 22–32)
Calcium: 8.9 mg/dL (ref 8.9–10.3)
Chloride: 103 mmol/L (ref 98–111)
Creatinine, Ser: 0.8 mg/dL (ref 0.44–1.00)
GFR, Estimated: >60 mL/min (ref >60)
Glucose, Bld: 92 mg/dL (ref 70–99)
Potassium: 4.1 mmol/L (ref 3.5–5.1)
Sodium: 137 mmol/L (ref 135–145)
Total Bilirubin: 0.4 mg/dL (ref 0.3–1.2)
Total Protein: 7.3 g/dL (ref 6.5–8.1)

## 2020-08-24 LAB — PREGNANCY, URINE: Preg Test, Ur: NEGATIVE

## 2020-08-24 MED ORDER — OMEPRAZOLE 40 MG PO CPDR: 40.0000 mg | DELAYED_RELEASE_CAPSULE | Freq: Every day | ORAL | 4 refills | Status: DC

## 2020-08-24 MED FILL — OMEPRAZOLE 40 MG CPDR: 40 | 90 days supply | Qty: 90 | Fill #0

## 2020-08-24 MED FILL — ONDANSETRON ODT 4 MG TABLET: 4 | 2 days supply | Qty: 2 | Fill #1

## 2020-08-25 ENCOUNTER — Telehealth: Payer: Self-pay | Admitting: Oncology

## 2020-08-25 NOTE — Telephone Encounter (Signed)
Scheduled appts per 12/21 los. Pt to refer to mychart for future appts

## 2020-08-30 NOTE — Progress Notes (Signed)
[facility] Progress Note   Autumn Bullock [id number] Apr 08, 1976 44 y.o.  Autumn Bullock is managed by Dr. Lurline Del  Actively treated with chemotherapy/immunotherapy/hormonal therapy: yes  Current therapy: adriamycin and cytoxan  Last treated: 08/24/2020 (cycle # 1)  Next scheduled appointment with provider: 09/14/2020  Assessment: Plan:    Malignant neoplasm of upper-outer quadrant of right breast in female, estrogen receptor positive ([place]) - Plan: dexamethasone (DECADRON) 4 MG tablet  Anemia due to antineoplastic chemotherapy  Other constipation   ER positive malignant neoplasm of the right breast: Ms. Autumn Bullock presents to the clinic today for consideration of cycle 2, day 1 of Adriamycin and Cytoxan. We'll proceed with her treatment today.  She is scheduled to return on 09/14/2020 for follow-up with Dr. Jana Hakim and for her next treatment.  She plans to discuss with Dr. Jana Hakim her desire to return to work when she is receiving weekly Taxol.  She reports that it would be easier for her if she could have those treatments on Fridays so that she would have the weekend to recover and then would be able to return to work on Mondays.  Anemia: Her labs returned today with a CBC showing a hemoglobin of 12.3 and a hematocrit of 35.4.  This will be rechecked on her return.  Constipation: Patient was given a sheet regarding management of constipation.  It was recommended that she begin senna S1-2 tablets twice daily and to push fluids.  Please see After Visit Summary for patient specific instructions.  Future Appointments  Date Time Provider [place]  08/31/2020 10:00 AM [facility] INFUSION [facility] None  09/02/2020 12:30 PM [facility] [place] FLUSH [facility] None  09/14/2020  9:45 AM [facility] LAB [facility] None  09/14/2020 10:00 AM [facility] [place] FLUSH [facility] None  09/14/2020 10:30 AM Magrinat, Virgie Dad, MD [facility] None  09/14/2020 11:00 AM  [facility] INFUSION [facility] None  09/16/2020  2:30 PM [facility] [place] FLUSH [facility] None  09/28/2020 12:00 PM [facility] [place] FLUSH [facility] None  09/28/2020 12:30 PM Magrinat, Virgie Dad, MD [facility] None  09/28/2020  1:15 PM [facility] INFUSION [facility] None  09/30/2020 12:00 PM [facility] [place] FLUSH [facility] None  10/12/2020  9:45 AM [facility] LAB [facility] None  10/12/2020 10:00 AM [facility] [place] None  10/12/2020 11:00 AM [facility] INFUSION [facility] None  10/19/2020 11:45 AM [facility] LAB [facility] None  10/19/2020 12:00 PM [facility] [place] FLUSH [facility] None  10/19/2020  1:00 PM [facility] INFUSION [facility] None  11/01/2020  8:15 AM Rosenberger, Valerie A, PTA [facility] None    No orders of the defined types were placed in this encounter.      Subjective:   Patient ID:  Autumn Bullock is a 44 y.o. (DOB Jun 01, 1976) female.  Chief Complaint: No chief complaint on file.   HPI Autumn Bullock  is a 44 y.o. female with a diagnosis of an ER positive malignant neoplasm of the right breast.  She is followed by Dr. Jana Hakim and presents to the clinic today for consideration of cycle 2, day 1 of Adriamycin and Cytoxan.  She reports that she had nausea, vomiting, and diarrhea for around 2 days following cycle 1 of chemotherapy.  She reports that she was only given 2 tablets of Zofran by the pharmacy.  This has been refilled today.  She was unsure of which medications to use.  Since that time she has made a spreadsheet with all of her medications so that will be easier for her with this and  future cycles.  She would like to return to work when she starts weekly Taxol.  She plans to discuss this with Dr. Darnelle Catalan.  It would be easier for her if her weekly Taxol was transitioned to Fridays so that she would have the weekend to recover before returning to work on Mondays.  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    She has had constipation which  she is treated with MiraLAX was then subsequently causes her to have diarrhea.  Medications: I have reviewed the patient's current medications.  Allergies:  Allergies  Allergen Reactions  . Wound Dressing Adhesive     Tape etc     Past Medical History:  Diagnosis Date  . Family history of uterine cancer 06/16/2020  . PONV (postoperative nausea and vomiting)     Past Surgical History:  Procedure Laterality Date  . BREAST LUMPECTOMY WITH RADIOACTIVE SEED AND SENTINEL LYMPH NODE BIOPSY Right 07/14/2020   Procedure: RIGHT BREAST LUMPECTOMY WITH BRACKETED RADIOACTIVE SEED AND SENTINEL LYMPH NODE BIOPSY, BLUE DYE INJECTION;  Surgeon: Manus Rudd, MD;  Location: [facility];  Service: General;  Laterality: Right;  PEC BLOCK, BLUE DYE INJECTION  . CESAREAN SECTION     2011  . PORTACATH PLACEMENT Right 07/14/2020   Procedure: INSERTION PORT-A-CATH WITH ULTRASOUND GUIDANCE;  Surgeon: Manus Rudd, MD;  Location: [facility];  Service: General;  Laterality: Right;  . RE-EXCISION OF BREAST LUMPECTOMY Right 08/03/2020   Procedure: RE-EXCISION OF RIGHT BREAST LUMPECTOMY SUPERIOR MARGINS;  Surgeon: Manus Rudd, MD;  Location: [facility];  Service: General;  Laterality: Right;  LMA    Family History  Problem Relation Age of Onset  . Cancer Maternal Grandmother 60       Unknown GYN.  ?Uterine    Social History   Socioeconomic History  . Marital status: Married    Spouse name: Not on file  . Number of children: Not on file  . Years of education: Not on file  . Highest education level: Not on file  Occupational History  . Not on file  Tobacco Use  . Smoking status: Never Smoker  . Smokeless tobacco: Never Used  Substance and Sexual Activity  . Alcohol use: Yes    Comment: 7/week  . Drug use: Never  . Sexual activity: Not on file  Other Topics Concern  . Not on file  Social History Narrative  . Not on file   Social Determinants  of Health   Financial Resource Strain: Low Risk   . Difficulty of Paying Living Expenses: Not hard at all  Food Insecurity: No Food Insecurity  . Worried About Programme researcher, broadcasting/film/video in the Last Year: Never true  . Ran Out of Food in the Last Year: Never true  Transportation Needs: No Transportation Needs  . Lack of Transportation (Medical): No  . Lack of Transportation (Non-Medical): No  Physical Activity: Not on file  Stress: Not on file  Social Connections: Not on file  Intimate Partner Violence: Not on file    Past Medical History, Surgical history, Social history, and Family history were reviewed and updated as appropriate.   Please see review of systems for further details on the patient's review from today.   Review of Systems:  Review of Systems  Constitutional: Negative for chills, diaphoresis and fever.  HENT: Negative for trouble swallowing and voice change.   Respiratory: Negative for cough, chest tightness, shortness of breath and wheezing.  Cardiovascular: Negative for chest pain and palpitations.  Gastrointestinal: Positive for constipation, diarrhea, nausea and vomiting. Negative for abdominal pain.  Musculoskeletal: Negative for back pain and myalgias.  Neurological: Negative for dizziness, light-headedness and headaches.    Objective:   Physical Exam:  BP 108/63 (BP Location: Left Arm, Patient Position: Sitting)   Pulse 65   Temp (!) 97.5 F (36.4 C) (Tympanic)   Resp 18   Ht 5\' 5"  (1.651 m)   Wt 161 lb 8 oz (73.3 kg)   SpO2 100%   BMI 26.88 kg/m  ECOG: 0  Physical Exam Constitutional:      General: She is not in acute distress.    Appearance: She is not diaphoretic.  HENT:     Head: Normocephalic and atraumatic.  Eyes:     General: No scleral icterus.       Right eye: No discharge.        Left eye: No discharge.     Conjunctiva/sclera: Conjunctivae normal.  Cardiovascular:     Rate and Rhythm: Normal rate and regular rhythm.     Heart  sounds: Normal heart sounds. No murmur heard. No friction rub. No gallop.   Pulmonary:     Effort: Pulmonary effort is normal. No respiratory distress.     Breath sounds: Normal breath sounds. No wheezing or rales.  Chest:    Skin:    General: Skin is warm and dry.     Findings: No erythema or rash.  Neurological:     Mental Status: She is alert.     Coordination: Coordination normal.     Gait: Gait normal.  Psychiatric:        Mood and Affect: Mood normal.        Behavior: Behavior normal.        Thought Content: Thought content normal.        Judgment: Judgment normal.     Lab Review:     Component Value Date/Time   NA 137 08/24/2020 1315   K 4.1 08/24/2020 1315   CL 103 08/24/2020 1315   CO2 27 08/24/2020 1315   GLUCOSE 92 08/24/2020 1315   BUN 13 08/24/2020 1315   CREATININE 0.80 08/24/2020 1315   CREATININE 0.73 08/17/2020 0905   CALCIUM 8.9 08/24/2020 1315   PROT 7.3 08/24/2020 1315   ALBUMIN 4.2 08/24/2020 1315   AST 11 (L) 08/24/2020 1315   AST 18 08/17/2020 0905   ALT 14 08/24/2020 1315   ALT 18 08/17/2020 0905   ALKPHOS 72 08/24/2020 1315   BILITOT 0.4 08/24/2020 1315   BILITOT 0.7 08/17/2020 0905   GFRNONAA >60 08/24/2020 1315   GFRNONAA >60 08/17/2020 0905       Component Value Date/Time   WBC 6.3 08/31/2020 0917   RBC 3.73 (L) 08/31/2020 0917   HGB 12.3 08/31/2020 0917   HGB 12.6 08/17/2020 0905   HCT 35.4 (L) 08/31/2020 0917   PLT 162 08/31/2020 0917   PLT 222 08/17/2020 0905   MCV 94.9 08/31/2020 0917   MCH 33.0 08/31/2020 0917   MCHC 34.7 08/31/2020 0917   RDW 11.7 08/31/2020 0917   LYMPHSABS 1.3 08/31/2020 0917   MONOABS 0.5 08/31/2020 0917   EOSABS 0.0 08/31/2020 0917   BASOSABS 0.0 08/31/2020 0917   -------------------------------  Imaging from last 24 hours (if applicable):  Radiology interpretation: No results found.

## 2020-08-31 ENCOUNTER — Other Ambulatory Visit: Payer: Self-pay

## 2020-08-31 ENCOUNTER — Other Ambulatory Visit: Payer: Self-pay | Admitting: Oncology

## 2020-08-31 ENCOUNTER — Inpatient Hospital Stay: Payer: BC Managed Care – PPO

## 2020-08-31 ENCOUNTER — Inpatient Hospital Stay: Payer: BC Managed Care – PPO | Admitting: Medical

## 2020-08-31 VITALS — BP 108/63 | HR 65 | Temp 97.5°F | Resp 18 | Ht 65.0 in | Wt 161.5 lb

## 2020-08-31 DIAGNOSIS — T451X5A Adverse effect of antineoplastic and immunosuppressive drugs, initial encounter: Secondary | ICD-10-CM

## 2020-08-31 DIAGNOSIS — Z170 Estrogen receptor positive status [ER+]: Secondary | ICD-10-CM

## 2020-08-31 DIAGNOSIS — C50411 Malignant neoplasm of upper-outer quadrant of right female breast: Secondary | ICD-10-CM

## 2020-08-31 DIAGNOSIS — D6481 Anemia due to antineoplastic chemotherapy: Secondary | ICD-10-CM | POA: Diagnosis not present

## 2020-08-31 DIAGNOSIS — K5909 Other constipation: Secondary | ICD-10-CM | POA: Diagnosis not present

## 2020-08-31 DIAGNOSIS — Z95828 Presence of other vascular implants and grafts: Secondary | ICD-10-CM

## 2020-08-31 DIAGNOSIS — Z5111 Encounter for antineoplastic chemotherapy: Secondary | ICD-10-CM | POA: Diagnosis not present

## 2020-08-31 LAB — COMPREHENSIVE METABOLIC PANEL
ALT: 20 U/L (ref 0–44)
AST: 16 U/L (ref 15–41)
Albumin: 4 g/dL (ref 3.5–5.0)
Alkaline Phosphatase: 90 U/L (ref 38–126)
Anion gap: 7 (ref 5–15)
BUN: 13 mg/dL (ref 6–20)
CO2: 25 mmol/L (ref 22–32)
Calcium: 8.8 mg/dL — ABNORMAL LOW (ref 8.9–10.3)
Chloride: 106 mmol/L (ref 98–111)
Creatinine, Ser: 0.74 mg/dL (ref 0.44–1.00)
GFR, Estimated: >60 mL/min (ref >60)
Glucose, Bld: 106 mg/dL — ABNORMAL HIGH (ref 70–99)
Potassium: 4 mmol/L (ref 3.5–5.1)
Sodium: 138 mmol/L (ref 135–145)
Total Bilirubin: 0.3 mg/dL (ref 0.3–1.2)
Total Protein: 7 g/dL (ref 6.5–8.1)

## 2020-08-31 LAB — CBC WITH DIFFERENTIAL/PLATELET
Abs Immature Granulocytes: 0.4 10*3/uL — ABNORMAL HIGH (ref 0.00–0.07)
Basophils Absolute: 0 10*3/uL (ref 0.0–0.1)
Basophils Relative: 1 %
Eosinophils Absolute: 0 10*3/uL (ref 0.0–0.5)
Eosinophils Relative: 0 %
HCT: 35.4 % — ABNORMAL LOW (ref 36.0–46.0)
Hemoglobin: 12.3 g/dL (ref 12.0–15.0)
Immature Granulocytes: 6 %
Lymphocytes Relative: 21 %
Lymphs Abs: 1.3 10*3/uL (ref 0.7–4.0)
MCH: 33 pg (ref 26.0–34.0)
MCHC: 34.7 g/dL (ref 30.0–36.0)
MCV: 94.9 fL (ref 80.0–100.0)
Monocytes Absolute: 0.5 10*3/uL (ref 0.1–1.0)
Monocytes Relative: 8 %
Neutro Abs: 4 10*3/uL (ref 1.7–7.7)
Neutrophils Relative %: 64 %
Platelets: 162 10*3/uL (ref 150–400)
RBC: 3.73 MIL/uL — ABNORMAL LOW (ref 3.87–5.11)
RDW: 11.7 % (ref 11.5–15.5)
WBC: 6.3 10*3/uL (ref 4.0–10.5)
nRBC: 0 % (ref 0.0–0.2)

## 2020-08-31 LAB — PREGNANCY, URINE: Preg Test, Ur: NEGATIVE

## 2020-08-31 MED ORDER — SODIUM CHLORIDE 0.9 % IV SOLN
150.0000 mg | Freq: Once | INTRAVENOUS | Status: AC
  Administered 2020-08-31: 150 mg via INTRAVENOUS
  Filled 2020-08-31: qty 150

## 2020-08-31 MED ORDER — SODIUM CHLORIDE 0.9 % IV SOLN
10.0000 mg | Freq: Once | INTRAVENOUS | Status: AC
  Administered 2020-08-31: 10 mg via INTRAVENOUS
  Filled 2020-08-31: qty 10

## 2020-08-31 MED ORDER — PALONOSETRON HCL INJECTION 0.25 MG/5ML
INTRAVENOUS | Status: AC
  Filled 2020-08-31: qty 5

## 2020-08-31 MED ORDER — DEXAMETHASONE 4 MG PO TABS: ORAL_TABLET | ORAL | 1 refills | Status: DC

## 2020-08-31 MED ORDER — ONDANSETRON 4 MG PO TBDP: 4.0000 mg | ORAL_TABLET | Freq: Three times a day (TID) | ORAL | 2 refills | Status: DC | PRN

## 2020-08-31 MED ORDER — SODIUM CHLORIDE 0.9% FLUSH
10.0000 mL | INTRAVENOUS | Status: DC | PRN
  Administered 2020-08-31: 10 mL
  Filled 2020-08-31: qty 10

## 2020-08-31 MED ORDER — HEPARIN SOD (PORK) LOCK FLUSH 100 UNIT/ML IV SOLN
500.0000 [IU] | Freq: Once | INTRAVENOUS | Status: AC | PRN
  Administered 2020-08-31: 500 [IU]
  Filled 2020-08-31: qty 5

## 2020-08-31 MED ORDER — SODIUM CHLORIDE 0.9% FLUSH
10.0000 mL | INTRAVENOUS | Status: DC | PRN
  Administered 2020-08-31: 10 mL via INTRAVENOUS
  Filled 2020-08-31: qty 10

## 2020-08-31 MED ORDER — DOXORUBICIN HCL CHEMO IV INJECTION 2 MG/ML
60.0000 mg/m2 | Freq: Once | INTRAVENOUS | Status: AC
  Administered 2020-08-31: 110 mg via INTRAVENOUS
  Filled 2020-08-31: qty 55

## 2020-08-31 MED ORDER — SODIUM CHLORIDE 0.9 % IV SOLN
Freq: Once | INTRAVENOUS | Status: AC
  Filled 2020-08-31: qty 250

## 2020-08-31 MED ORDER — SODIUM CHLORIDE 0.9 % IV SOLN
600.0000 mg/m2 | Freq: Once | INTRAVENOUS | Status: AC
  Administered 2020-08-31: 1100 mg via INTRAVENOUS
  Filled 2020-08-31: qty 55

## 2020-08-31 MED ORDER — PALONOSETRON HCL INJECTION 0.25 MG/5ML
0.2500 mg | Freq: Once | INTRAVENOUS | Status: AC
  Administered 2020-08-31: 0.25 mg via INTRAVENOUS

## 2020-08-31 NOTE — Patient Instructions (Signed)

## 2020-08-31 NOTE — Progress Notes (Signed)
OK to treat.  Johnanthony Wilden, MHS, PA-C Physician Assistant 

## 2020-08-31 NOTE — Patient Instructions (Signed)
[facility] Discharge Instructions for Patients Receiving Chemotherapy  Today you received the following chemotherapy agents Adriamycin; Cytoxin  To help prevent nausea and vomiting after your treatment, we encourage you to take your nausea medication as directed If you develop nausea and vomiting that is not controlled by your nausea medication, call the clinic.   BELOW ARE SYMPTOMS THAT SHOULD BE REPORTED IMMEDIATELY:  *FEVER GREATER THAN 100.5 F  *CHILLS WITH OR WITHOUT FEVER  NAUSEA AND VOMITING THAT IS NOT CONTROLLED WITH YOUR NAUSEA MEDICATION  *UNUSUAL SHORTNESS OF BREATH  *UNUSUAL BRUISING OR BLEEDING  TENDERNESS IN MOUTH AND THROAT WITH OR WITHOUT PRESENCE OF ULCERS  *URINARY PROBLEMS  *BOWEL PROBLEMS  UNUSUAL RASH Items with * indicate a potential emergency and should be followed up as soon as possible.  Feel free to call the clinic should you have any questions or concerns. The clinic phone number is [phone].  Please show the CHEMO ALERT CARD at check-in to the Emergency Department and triage nurse.   

## 2020-08-31 NOTE — Progress Notes (Signed)
Blood return noted before and after Adriamycin push

## 2020-09-01 MED FILL — ONDANSETRON ODT 4 MG TABLET: 4 | 6 days supply | Qty: 16 | Fill #0

## 2020-09-01 MED FILL — LIDOCAINE-PRILOCAINE CREAM: 2.5-2.5 | 21 days supply | Qty: 30 | Fill #1

## 2020-09-01 MED FILL — PROCHLORPERAZINE 10 MG TAB: 10 | 7 days supply | Qty: 30 | Fill #1

## 2020-09-02 ENCOUNTER — Inpatient Hospital Stay: Payer: BC Managed Care – PPO

## 2020-09-02 ENCOUNTER — Other Ambulatory Visit: Payer: Self-pay

## 2020-09-02 ENCOUNTER — Inpatient Hospital Stay (HOSPITAL_BASED_OUTPATIENT_CLINIC_OR_DEPARTMENT_OTHER): Payer: BC Managed Care – PPO | Admitting: Adult Health

## 2020-09-02 VITALS — BP 119/62 | HR 80 | Temp 98.5°F | Resp 18 | Ht 65.0 in | Wt 160.9 lb

## 2020-09-02 DIAGNOSIS — C50411 Malignant neoplasm of upper-outer quadrant of right female breast: Secondary | ICD-10-CM

## 2020-09-02 DIAGNOSIS — Z5111 Encounter for antineoplastic chemotherapy: Secondary | ICD-10-CM | POA: Diagnosis not present

## 2020-09-02 DIAGNOSIS — Z170 Estrogen receptor positive status [ER+]: Secondary | ICD-10-CM

## 2020-09-02 DIAGNOSIS — N3000 Acute cystitis without hematuria: Secondary | ICD-10-CM

## 2020-09-02 LAB — CBC WITH DIFFERENTIAL/PLATELET
Abs Immature Granulocytes: 0.1 10*3/uL — ABNORMAL HIGH (ref 0.00–0.07)
Basophils Absolute: 0 10*3/uL (ref 0.0–0.1)
Basophils Relative: 0 %
Eosinophils Absolute: 0 10*3/uL (ref 0.0–0.5)
Eosinophils Relative: 0 %
HCT: 33.5 % — ABNORMAL LOW (ref 36.0–46.0)
Hemoglobin: 11.6 g/dL — ABNORMAL LOW (ref 12.0–15.0)
Immature Granulocytes: 1 %
Lymphocytes Relative: 6 %
Lymphs Abs: 0.7 10*3/uL (ref 0.7–4.0)
MCH: 33.1 pg (ref 26.0–34.0)
MCHC: 34.6 g/dL (ref 30.0–36.0)
MCV: 95.7 fL (ref 80.0–100.0)
Monocytes Absolute: 0.5 10*3/uL (ref 0.1–1.0)
Monocytes Relative: 5 %
Neutro Abs: 9.6 10*3/uL — ABNORMAL HIGH (ref 1.7–7.7)
Neutrophils Relative %: 88 %
Platelets: 205 10*3/uL (ref 150–400)
RBC: 3.5 MIL/uL — ABNORMAL LOW (ref 3.87–5.11)
RDW: 12 % (ref 11.5–15.5)
WBC: 11 10*3/uL — ABNORMAL HIGH (ref 4.0–10.5)
nRBC: 0 % (ref 0.0–0.2)

## 2020-09-02 LAB — URINALYSIS, COMPLETE (UACMP) WITH MICROSCOPIC
Bilirubin Urine: NEGATIVE
Glucose, UA: NEGATIVE mg/dL
Ketones, ur: NEGATIVE mg/dL
Nitrite: NEGATIVE
Protein, ur: NEGATIVE mg/dL
Specific Gravity, Urine: 1.01 (ref 1.005–1.030)
WBC, UA: >50 WBC/hpf — ABNORMAL HIGH (ref 0–5)
pH: 6 (ref 5.0–8.0)

## 2020-09-02 LAB — COMPREHENSIVE METABOLIC PANEL
ALT: 22 U/L (ref 0–44)
AST: 14 U/L — ABNORMAL LOW (ref 15–41)
Albumin: 4 g/dL (ref 3.5–5.0)
Alkaline Phosphatase: 81 U/L (ref 38–126)
Anion gap: 6 (ref 5–15)
BUN: 17 mg/dL (ref 6–20)
CO2: 24 mmol/L (ref 22–32)
Calcium: 8.6 mg/dL — ABNORMAL LOW (ref 8.9–10.3)
Chloride: 105 mmol/L (ref 98–111)
Creatinine, Ser: 0.72 mg/dL (ref 0.44–1.00)
GFR, Estimated: >60 mL/min (ref >60)
Glucose, Bld: 107 mg/dL — ABNORMAL HIGH (ref 70–99)
Potassium: 3.8 mmol/L (ref 3.5–5.1)
Sodium: 135 mmol/L (ref 135–145)
Total Bilirubin: 0.3 mg/dL (ref 0.3–1.2)
Total Protein: 7 g/dL (ref 6.5–8.1)

## 2020-09-02 MED ORDER — PEGFILGRASTIM-CBQV 6 MG/0.6ML ~~LOC~~ SOSY
PREFILLED_SYRINGE | SUBCUTANEOUS | Status: AC
  Filled 2020-09-02: qty 0.6

## 2020-09-02 MED ORDER — PEGFILGRASTIM-CBQV 6 MG/0.6ML ~~LOC~~ SOSY
6.0000 mg | PREFILLED_SYRINGE | Freq: Once | SUBCUTANEOUS | Status: AC
  Administered 2020-09-02: 6 mg via SUBCUTANEOUS

## 2020-09-02 MED ORDER — CIPROFLOXACIN HCL 500 MG PO TABS: 500.0000 mg | ORAL_TABLET | Freq: Two times a day (BID) | ORAL | 0 refills | Status: DC

## 2020-09-02 MED FILL — CIPROFLOXACIN HCL 500 MG TA: 500 | 5 days supply | Qty: 10 | Fill #0

## 2020-09-02 NOTE — Progress Notes (Signed)
Urine culture orders placed.  

## 2020-09-02 NOTE — Progress Notes (Signed)
[place]  [phone] [phone]     ID: MONTA MAIORANA DOB: 01-Feb-1976  MR#: [id number]  QBH#:[id number]  Patient Care Team: Brock Ra, PA-C as PCP - General Mauro Kaufmann, RN as Oncology Nurse Navigator Rockwell Germany, RN as Oncology Nurse Navigator Donnie Mesa, MD as Consulting Physician (General Surgery) Eppie Gibson, MD as Attending Physician (Radiation Oncology) Magrinat, Virgie Dad, MD as Consulting Physician (Oncology) Vanessa Kick, MD as Consulting Physician (Obstetrics and Gynecology) Lovett Calender, MD as Consulting Physician (Orthopedic Surgery) Scot Dock, NP OTHER MD:  CHIEF COMPLAINT: Estrogen receptor positive breast cancer  CURRENT TREATMENT: Adjuvant chemotherapy   INTERVAL HISTORY: Autumn Bullock returns today for follow up of her estrogen receptor positive breast cancer accompanied by her husband.   She is receiving Doxorubicin and cylophsopahmide given every 2 weeks with udenyca growth factor support on day three.  She woke up this morning c/o urinary hesitancy, bladder discomfort and dysuria.  She has had no fever or chills.     REVIEW OF SYSTEMS: A detailed ROS was otherwise non contributory today.      COVID 19 VACCINATION STATUS: Status post Pfizer x2, most recent treatment April 2021.  Patient also was diagnosed with COVID-19 disease November 2020   HISTORY OF CURRENT ILLNESS: From the original intake note:  Kinzleigh had routine screening mammography on 06/02/2020 showing a possible abnormality in the right breast. She underwent right diagnostic mammography with tomography and right breast ultrasonography at [facility] on 06/08/2020 showing: breast density category C; 2.3 cm right breast mass at 11:30; 0.8 cm mass superficial to dominant mass; no right axillary adenopathy.  Accordingly on 06/08/2020 she proceeded to biopsy of the right breast area in question. The pathology from this procedure  ([id number]) showed: invasive and in situ mammary carcinoma, grade 2, e-cadherin positive. Both biopsied masses showed this and were found to be morphologically similar. Prognostic indicators significant for: estrogen receptor, 90% positive and progesterone receptor, 70% positive, both with strong staining intensity. Proliferation marker Ki67 at 5%. HER2 negative by immunohistochemistry (1+).  The patient's subsequent history is as detailed below.   PAST MEDICAL HISTORY: Past Medical History:  Diagnosis Date  . Family history of uterine cancer 06/16/2020  . PONV (postoperative nausea and vomiting)     PAST SURGICAL HISTORY: Past Surgical History:  Procedure Laterality Date  . BREAST LUMPECTOMY WITH RADIOACTIVE SEED AND SENTINEL LYMPH NODE BIOPSY Right 07/14/2020   Procedure: RIGHT BREAST LUMPECTOMY WITH BRACKETED RADIOACTIVE SEED AND SENTINEL LYMPH NODE BIOPSY, BLUE DYE INJECTION;  Surgeon: Donnie Mesa, MD;  Location: [facility];  Service: General;  Laterality: Right;  PEC BLOCK, BLUE DYE INJECTION  . CESAREAN SECTION     2011  . PORTACATH PLACEMENT Right 07/14/2020   Procedure: INSERTION PORT-A-CATH WITH ULTRASOUND GUIDANCE;  Surgeon: Donnie Mesa, MD;  Location: [facility];  Service: General;  Laterality: Right;  . RE-EXCISION OF BREAST LUMPECTOMY Right 08/03/2020   Procedure: RE-EXCISION OF RIGHT BREAST LUMPECTOMY SUPERIOR MARGINS;  Surgeon: Donnie Mesa, MD;  Location: [facility];  Service: General;  Laterality: Right;  LMA    FAMILY HISTORY: Family History  Problem Relation Age of Onset  . Cancer Maternal Grandmother 44       Unknown GYN.  ?Uterine  As of October 2021 her parents are both living, her father at age 31 and her mother at 70, . Tally has 1 brother and 2 sisters. She reports uterine cancer in her  maternal grandmother at age 1. There is no family history of breast, ovarian, or colon cancer to her  knowledge.   GYNECOLOGIC HISTORY:  No LMP recorded. Menarche: 45 years old Age at first live birth: 44 years old [place] P 1 LMP 05/05/2020, regular monthly periods, lasting 3 days with 1-2 heavy days Contraceptive: has used for 15 years, no complications HRT n/a  Hysterectomy? no BSO? 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 Husband [name] works in Nature conservation officer. She lives at home with [name] and their daughter [name], age 29. She attends [employer] in [place].    ADVANCED DIRECTIVES: In the absence of any documentation to the contrary, the patient's spouse is their HCPOA.    HEALTH MAINTENANCE: Social History   Tobacco Use  . Smoking status: Never Smoker  . Smokeless tobacco: Never Used  Substance Use Topics  . Alcohol use: Yes    Comment: 7/week  . Drug use: Never     Colonoscopy: 1999?  PAP: 05/31/2020  Bone density: never done   Allergies  Allergen Reactions  . Wound Dressing Adhesive     Tape etc     Current Outpatient Medications  Medication Sig Dispense Refill  . dexamethasone (DECADRON) 4 MG tablet Take 2 tablets daily for 3 days after chemo. Take with food. 30 tablet 1  . ketoconazole (NIZORAL) 2 % cream Apply 1 application topically daily. 15 g 0  . lidocaine-prilocaine (EMLA) cream Apply to affected area once 30 g 3  . LORazepam (ATIVAN) 0.5 MG tablet Take 1 tablet (0.5 mg total) by mouth at bedtime as needed (Nausea or vomiting). 30 tablet 0  . omeprazole (PRILOSEC) 40 MG capsule Take 1 capsule (40 mg total) by mouth at bedtime. 90 capsule 4  . ondansetron (ZOFRAN ODT) 4 MG disintegrating tablet Take 1 tablet (4 mg total) by mouth every 8 (eight) hours as needed for nausea or vomiting. 20 tablet 2  . oxyCODONE (OXY IR/ROXICODONE) 5 MG immediate release tablet Take 1 tablet (5 mg total) by mouth every 6 (six) hours as needed for severe pain. 20 tablet 0  . prochlorperazine  (COMPAZINE) 10 MG tablet Take 1 tablet (10 mg total) by mouth every 6 (six) hours as needed (Nausea or vomiting). 30 tablet 1   Current Facility-Administered Medications  Medication Dose Route Frequency Provider Last Rate Last Admin  . pegfilgrastim-cbqv (UDENYCA) injection 6 mg  6 mg Subcutaneous Once Ladell Pier, MD        OBJECTIVE: White woman who appears well  Vitals:   09/02/20 0857  BP: 119/62  Pulse: 80  Resp: 18  Temp: 98.5 F (36.9 C)  SpO2: 100%     Body mass index is 26.78 kg/m.   Wt Readings from Last 3 Encounters:  09/02/20 160 lb 14.4 oz (73 kg)  08/31/20 161 lb 8 oz (73.3 kg)  08/24/20 157 lb 1.6 oz (71.3 kg)      ECOG FS:0 - Asymptomatic  GENERAL: Patient is a well appearing female in no acute distress HEENT:  Sclerae anicteric.  Mask in place. Neck is supple.  NODES:  No cervical, supraclavicular, or axillary lymphadenopathy palpated.  BREAST EXAM:  Deferred. LUNGS:  Clear to auscultation bilaterally.  No wheezes or rhonchi. HEART:  Regular rate and rhythm. No murmur appreciated. ABDOMEN:  Soft, nontender.  Positive, normoactive bowel sounds. No organomegaly palpated. MSK:  No focal spinal tenderness to palpation. Full range of motion  bilaterally in the upper extremities. EXTREMITIES:  No peripheral edema.   SKIN:  Clear with no obvious rashes or skin changes. No nail dyscrasia. NEURO:  Nonfocal. Well oriented.  Appropriate affect.    LAB RESULTS:  CMP     Component Value Date/Time   NA 138 08/31/2020 0917   K 4.0 08/31/2020 0917   CL 106 08/31/2020 0917   CO2 25 08/31/2020 0917   GLUCOSE 106 (H) 08/31/2020 0917   BUN 13 08/31/2020 0917   CREATININE 0.74 08/31/2020 0917   CREATININE 0.73 08/17/2020 0905   CALCIUM 8.8 (L) 08/31/2020 0917   PROT 7.0 08/31/2020 0917   ALBUMIN 4.0 08/31/2020 0917   AST 16 08/31/2020 0917   AST 18 08/17/2020 0905   ALT 20 08/31/2020 0917   ALT 18 08/17/2020 0905   ALKPHOS 90 08/31/2020 0917   BILITOT 0.3  08/31/2020 0917   BILITOT 0.7 08/17/2020 0905   GFRNONAA >60 08/31/2020 0917   GFRNONAA >60 08/17/2020 0905    No results found for: TOTALPROTELP, ALBUMINELP, A1GS, A2GS, BETS, BETA2SER, GAMS, MSPIKE, SPEI  Lab Results  Component Value Date   WBC 11.0 (H) 09/02/2020   NEUTROABS 9.6 (H) 09/02/2020   HGB 11.6 (L) 09/02/2020   HCT 33.5 (L) 09/02/2020   MCV 95.7 09/02/2020   PLT 205 09/02/2020    No results found for: LABCA2  No components found for: [id number]  No results for input(s): INR in the last 168 hours.  No results found for: LABCA2  No results found for: KJZ791  No results found for: TAV697  No results found for: XYI016  No results found for: CA2729  No components found for: HGQUANT  No results found for: CEA1 / No results found for: CEA1   No results found for: AFPTUMOR  No results found for: CHROMOGRNA  No results found for: KPAFRELGTCHN, LAMBDASER, KAPLAMBRATIO (kappa/lambda light chains)  No results found for: HGBA, HGBA2QUANT, HGBFQUANT, HGBSQUAN (Hemoglobinopathy evaluation)   No results found for: LDH  No results found for: IRON, TIBC, IRONPCTSAT (Iron and TIBC)  No results found for: FERRITIN  Urinalysis No results found for: COLORURINE, APPEARANCEUR, LABSPEC, PHURINE, GLUCOSEU, HGBUR, BILIRUBINUR, KETONESUR, PROTEINUR, UROBILINOGEN, NITRITE, LEUKOCYTESUR   STUDIES: No results found.   ELIGIBLE FOR AVAILABLE RESEARCH PROTOCOL: AET  ASSESSMENT: 44 y.o. [place] woman status post right breast upper outer quadrant biopsy 06/08/2020 for a clinical mT2 N0, stage IB invasive ductal carcinoma, grade 2, estrogen and progesterone receptor positive, HER-2 not amplified, with an MIB-1 of 5%.  (1) genetics testing 06/22/2020 through the Common Hereditary Cancers Panel offered by [facility] found no deleterious mutations in APC, ATM, AXIN2, BARD1, BMPR1A, BRCA1, BRCA2, BRIP1, CDH1, CDK4, CDKN2A (p14ARF), CDKN2A (p16INK4a), CHEK2, CTNNA1, DICER1,  EPCAM (Deletion/duplication testing only), GREM1 (promoter region deletion/duplication testing only), KIT, MEN1, MLH1, MSH2, MSH3, MSH6, MUTYH, NBN, NF1, NHTL1, PALB2, PDGFRA, PMS2, POLD1, POLE, PTEN, RAD50, RAD51C, RAD51D, RNF43, SDHB, SDHC, SDHD, SMAD4, SMARCA4. STK11, TP53, TSC1, TSC2, and VHL.  The following genes were evaluated for sequence changes only: SDHA and HOXB13 c.251G>A variant only.  (a)  a variant of uncertain significance in MSH3 at c.2731T>G (p.Leu911Val).   (2) right lumpectomy and sentinel lymph node sampling 07/14/2020 showed a pT2 pN1(mic), stage IIA invasive ductal carcinoma, grade 2, with a positive superior margin  (a) additional surgery 08/03/2020  (3) MammaPrint obtained from the initial biopsy showed high risk, predicting a 93% 5-year disease-free survival with chemotherapy and significant benefit from chemotherapy (greater than 12%).  (4)  cyclophosphamide and doxorubicin in dose dense fashion x4 to start 08/03/2020, to be followed by paclitaxel weekly x12  (a) echo 07/09/2020 shows an ejection fraction in the 60-65% range.  (5) adjuvant radiation to follow  (6) antiestrogens to start at the completion of local treatment   PLAN: Her urine is consistent with UTI.  We will send a culture.  I will sent in oral antibiotics with cipro BID.  I reassured her that the antibiotic cipro we give commonly after treatment anyhow.  She will take this for 5 days, and once we have her urine culture back, we can change treatment if indicated.  She voiced understanding.  She will return next week for labs and f/u.  She knows to call for any questions that may arise between now and her next appointment.  We are happy to see her sooner if needed.   Encounter time 20 minutes.Wilber Bihari, NP 09/03/20 4:05 PM Medical Oncology and Hematology [facility] [place], [place] [address] Tel. [phone]    Fax. [phone]    *Total Encounter Time as  defined by the Centers for Medicare and Medicaid Services includes, in addition to the face-to-face time of a patient visit (documented in the note above) non-face-to-face time: obtaining and reviewing outside history, ordering and reviewing medications, tests or procedures, care coordination (communications with other health care professionals or caregivers) and documentation in the medical record.

## 2020-09-03 ENCOUNTER — Encounter: Payer: Self-pay | Admitting: Adult Health

## 2020-09-04 LAB — URINE CULTURE: Culture: >=100000 — AB

## 2020-09-06 ENCOUNTER — Telehealth: Payer: Self-pay | Admitting: Adult Health

## 2020-09-06 NOTE — Telephone Encounter (Signed)
No 12/30 los, no changes made to pt schedule  

## 2020-09-12 ENCOUNTER — Encounter: Payer: Self-pay | Admitting: Oncology

## 2020-09-13 ENCOUNTER — Other Ambulatory Visit: Payer: Self-pay | Admitting: *Deleted

## 2020-09-13 ENCOUNTER — Other Ambulatory Visit: Payer: Self-pay | Admitting: Oncology

## 2020-09-13 MED ORDER — DOXYCYCLINE HYCLATE 100 MG PO TABS: 100.0000 mg | ORAL_TABLET | Freq: Every day | ORAL | 1 refills | Status: DC

## 2020-09-13 MED FILL — DOXYCYCLINE HYCLATE 100 MG: 100 | 60 days supply | Qty: 60 | Fill #0

## 2020-09-13 NOTE — Progress Notes (Signed)
[facility]  [phone]) [phone] [phone]) [phone]     ID: Autumn Bullock DOB: Jan 15, 1976  MR#: [id number]  OHY#:[id number]  Patient Care Team: Brock Ra, PA-C as PCP - General Mauro Kaufmann, RN as Oncology Nurse Navigator Rockwell Germany, RN as Oncology Nurse Navigator Donnie Mesa, MD as Consulting Physician (General Surgery) Eppie Gibson, MD as Attending Physician (Radiation Oncology) Yania Bogie, Virgie Dad, MD as Consulting Physician (Oncology) Vanessa Kick, MD as Consulting Physician (Obstetrics and Gynecology) Lovett Calender, MD as Consulting Physician (Orthopedic Surgery) Chauncey Cruel, MD OTHER MD:  CHIEF COMPLAINT: Estrogen receptor positive breast cancer  CURRENT TREATMENT: Adjuvant chemotherapy   INTERVAL HISTORY: [name] returns today for follow up and treatment of her estrogen receptor positive breast cancer.  She is receiving doxorubicin and cylophsopahmide given every 2 weeks with udenyca growth factor support on day three. Today is day 1 cycle 3.   REVIEW OF SYSTEMS: [name] finally lost her hair.  She seems to have managed that very well from a psychological point of view.  Her scalp feels bumpy to her.  She has lost some eyelashes but still has her eyebrows.  Her UTI symptoms resolved with Cipro.  She has developed a facial rash consistent with rosacea.  This is not a new problem for her but it is bothersome.  She is continuing to work and would like to get treated on Fridays when she starts the paclitaxel so she is able to work the other 4 days.  She has a remote left knee injury and some fluid in the knee.  She is planning to follow-up with Dr. Lanae Boast regarding that.  A detailed review of systems was otherwise stable   COVID 19 VACCINATION STATUS: Status post Pfizer x2, most recent treatment April 2021.  Patient also was diagnosed with COVID-19 disease November 2020   HISTORY OF CURRENT ILLNESS: From the original intake  note:  Autumn Bullock had routine screening mammography on 06/02/2020 showing a possible abnormality in the right breast. She underwent right diagnostic mammography with tomography and right breast ultrasonography at [facility] on 06/08/2020 showing: breast density category C; 2.3 cm right breast mass at 11:30; 0.8 cm mass superficial to dominant mass; no right axillary adenopathy.  Accordingly on 06/08/2020 she proceeded to biopsy of the right breast area in question. The pathology from this procedure ([id number]) showed: invasive and in situ mammary carcinoma, grade 2, e-cadherin positive. Both biopsied masses showed this and were found to be morphologically similar. Prognostic indicators significant for: estrogen receptor, 90% positive and progesterone receptor, 70% positive, both with strong staining intensity. Proliferation marker Ki67 at 5%. HER2 negative by immunohistochemistry (1+).  The patient's subsequent history is as detailed below.   PAST MEDICAL HISTORY: Past Medical History:  Diagnosis Date  . Family history of uterine cancer 06/16/2020  . PONV (postoperative nausea and vomiting)     PAST SURGICAL HISTORY: Past Surgical History:  Procedure Laterality Date  . BREAST LUMPECTOMY WITH RADIOACTIVE SEED AND SENTINEL LYMPH NODE BIOPSY Right 07/14/2020   Procedure: RIGHT BREAST LUMPECTOMY WITH BRACKETED RADIOACTIVE SEED AND SENTINEL LYMPH NODE BIOPSY, BLUE DYE INJECTION;  Surgeon: Donnie Mesa, MD;  Location: [facility];  Service: General;  Laterality: Right;  PEC BLOCK, BLUE DYE INJECTION  . CESAREAN SECTION     2011  . PORTACATH PLACEMENT Right 07/14/2020   Procedure: INSERTION PORT-A-CATH WITH ULTRASOUND GUIDANCE;  Surgeon: Donnie Mesa, MD;  Location: [facility];  Service: General;  Laterality: Right;  .  RE-EXCISION OF BREAST LUMPECTOMY Right 08/03/2020   Procedure: RE-EXCISION OF RIGHT BREAST LUMPECTOMY SUPERIOR MARGINS;  Surgeon: Donnie Mesa,  MD;  Location: [facility];  Service: General;  Laterality: Right;  LMA    FAMILY HISTORY: Family History  Problem Relation Age of Onset  . Cancer Maternal Grandmother 64       Unknown GYN.  ?Uterine  As of October 2021 her parents are both living, her father at age 87 and her mother at 53, . Autumn Bullock has 1 brother and 2 sisters. She reports uterine cancer in her maternal grandmother at age 26. There is no family history of breast, ovarian, or colon cancer to her knowledge.   GYNECOLOGIC HISTORY:  No LMP recorded. Menarche: 45 years old Age at first live birth: 45 years old [place] P 1 LMP 05/05/2020, regular monthly periods, lasting 3 days with 1-2 heavy days Contraceptive: has used for 15 years, no complications HRT n/a  Hysterectomy? no BSO? 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 Husband [name] works in Nature conservation officer. She lives at home with [name] and their daughter [name], age 73. She attends [employer] in [place].    ADVANCED DIRECTIVES: In the absence of any documentation to the contrary, the patient's spouse is their HCPOA.    HEALTH MAINTENANCE: Social History   Tobacco Use  . Smoking status: Never Smoker  . Smokeless tobacco: Never Used  Substance Use Topics  . Alcohol use: Yes    Comment: 7/week  . Drug use: Never     Colonoscopy: 1999?  PAP: 05/31/2020  Bone density: never done   Allergies  Allergen Reactions  . Wound Dressing Adhesive     Tape etc     Current Outpatient Medications  Medication Sig Dispense Refill  . docusate sodium (COLACE) 100 MG capsule Take 2 capsules (200 mg total) by mouth 2 (two) times daily. 80 capsule 0  . metroNIDAZOLE (METROGEL) 1 % gel Apply topically daily. 45 g 0  . dexamethasone (DECADRON) 4 MG tablet Take 2 tablets daily for 3 days after chemo. Take with food. 30 tablet 1  . doxycycline (VIBRA-TABS) 100 MG tablet Take 1 tablet (100  mg total) by mouth daily. 60 tablet 1  . ketoconazole (NIZORAL) 2 % cream Apply 1 application topically daily. 15 g 0  . lidocaine-prilocaine (EMLA) cream Apply to affected area once 30 g 3  . LORazepam (ATIVAN) 0.5 MG tablet Take 1 tablet (0.5 mg total) by mouth at bedtime as needed (Nausea or vomiting). 30 tablet 0  . omeprazole (PRILOSEC) 40 MG capsule Take 1 capsule (40 mg total) by mouth at bedtime. 90 capsule 4  . ondansetron (ZOFRAN ODT) 4 MG disintegrating tablet Take 1 tablet (4 mg total) by mouth every 8 (eight) hours as needed for nausea or vomiting. 20 tablet 2  . prochlorperazine (COMPAZINE) 10 MG tablet Take 1 tablet (10 mg total) by mouth every 6 (six) hours as needed (Nausea or vomiting). 30 tablet 1   No current facility-administered medications for this visit.   Facility-Administered Medications Ordered in Other Visits  Medication Dose Route Frequency Provider Last Rate Last Admin  . sodium chloride flush (NS) 0.9 % injection 10 mL  10 mL Intracatheter PRN Magrinat, Virgie Dad, MD   10 mL at 09/14/20 1401    OBJECTIVE: White woman who appears stated age  24:   09/14/20 1033  BP: (!) 106/93  Pulse: 63  Resp: 18  Temp: (!) 97.5 F (36.4 C)  SpO2: 100%     Body mass index is 27.14 kg/m.   Wt Readings from Last 3 Encounters:  09/14/20 163 lb 1.6 oz (74 kg)  09/02/20 160 lb 14.4 oz (73 kg)  08/31/20 161 lb 8 oz (73.3 kg)      ECOG FS:1 - Symptomatic but completely ambulatory  Sclerae unicteric, EOMs intact Wearing a mask No cervical or supraclavicular adenopathy Lungs no rales or rhonchi Heart regular rate and rhythm Abd soft, nontender, positive bowel sounds MSK no focal spinal tenderness, no upper extremity lymphedema Neuro: nonfocal, well oriented, appropriate affect Breasts: The right breast is status post lumpectomy.  The cosmetic result is excellent.  The incisions are healing nicely.  There is no evidence of residual recurrent disease.  The left breast  is benign.  Both axillae are benign.   LAB RESULTS:  CMP     Component Value Date/Time   NA 138 09/14/2020 1016   K 3.9 09/14/2020 1016   CL 108 09/14/2020 1016   CO2 25 09/14/2020 1016   GLUCOSE 109 (H) 09/14/2020 1016   BUN 12 09/14/2020 1016   CREATININE 0.71 09/14/2020 1016   CREATININE 0.73 08/17/2020 0905   CALCIUM 8.6 (L) 09/14/2020 1016   PROT 6.6 09/14/2020 1016   ALBUMIN 3.9 09/14/2020 1016   AST 15 09/14/2020 1016   AST 18 08/17/2020 0905   ALT 16 09/14/2020 1016   ALT 18 08/17/2020 0905   ALKPHOS 91 09/14/2020 1016   BILITOT 0.3 09/14/2020 1016   BILITOT 0.7 08/17/2020 0905   GFRNONAA >60 09/14/2020 1016   GFRNONAA >60 08/17/2020 0905    No results found for: TOTALPROTELP, ALBUMINELP, A1GS, A2GS, BETS, BETA2SER, GAMS, MSPIKE, SPEI  Lab Results  Component Value Date   WBC 10.3 09/14/2020   NEUTROABS 7.4 09/14/2020   HGB 10.2 (L) 09/14/2020   HCT 29.3 (L) 09/14/2020   MCV 95.1 09/14/2020   PLT 193 09/14/2020    No results found for: LABCA2  No components found for: LKTGYB638  No results for input(s): INR in the last 168 hours.  No results found for: LABCA2  No results found for: LHT342  No results found for: AJG811  No results found for: XBW620  No results found for: CA2729  No components found for: HGQUANT  No results found for: CEA1 / No results found for: CEA1   No results found for: AFPTUMOR  No results found for: CHROMOGRNA  No results found for: KPAFRELGTCHN, LAMBDASER, KAPLAMBRATIO (kappa/lambda light chains)  No results found for: HGBA, HGBA2QUANT, HGBFQUANT, HGBSQUAN (Hemoglobinopathy evaluation)   No results found for: LDH  No results found for: IRON, TIBC, IRONPCTSAT (Iron and TIBC)  No results found for: FERRITIN  Urinalysis    Component Value Date/Time   COLORURINE YELLOW 09/02/2020 0843   APPEARANCEUR HAZY (A) 09/02/2020 0843   LABSPEC 1.010 09/02/2020 0843   PHURINE 6.0 09/02/2020 0843   GLUCOSEU NEGATIVE  09/02/2020 0843   HGBUR SMALL (A) 09/02/2020 0843   BILIRUBINUR NEGATIVE 09/02/2020 0843   KETONESUR NEGATIVE 09/02/2020 0843   PROTEINUR NEGATIVE 09/02/2020 0843   NITRITE NEGATIVE 09/02/2020 0843   LEUKOCYTESUR TRACE (A) 09/02/2020 0843    STUDIES: No results found.   ELIGIBLE FOR AVAILABLE RESEARCH PROTOCOL: AET  ASSESSMENT: 45 y.o. [place] woman status post right breast upper outer quadrant biopsy 06/08/2020 for a clinical mT2 N0, stage IB invasive ductal carcinoma, grade 2, estrogen and progesterone receptor positive, HER-2  not amplified, with an MIB-1 of 5%.  (1) genetics testing 06/22/2020 through the Common Hereditary Cancers Panel offered by [facility] found no deleterious mutations in APC, ATM, AXIN2, BARD1, BMPR1A, BRCA1, BRCA2, BRIP1, CDH1, CDK4, CDKN2A (p14ARF), CDKN2A (p16INK4a), CHEK2, CTNNA1, DICER1, EPCAM (Deletion/duplication testing only), GREM1 (promoter region deletion/duplication testing only), KIT, MEN1, MLH1, MSH2, MSH3, MSH6, MUTYH, NBN, NF1, NHTL1, PALB2, PDGFRA, PMS2, POLD1, POLE, PTEN, RAD50, RAD51C, RAD51D, RNF43, SDHB, SDHC, SDHD, SMAD4, SMARCA4. STK11, TP53, TSC1, TSC2, and VHL.  The following genes were evaluated for sequence changes only: SDHA and HOXB13 c.251G>A variant only.  (a)  a variant of uncertain significance in MSH3 at c.2731T>G (p.Leu911Val).   (2) right lumpectomy and sentinel lymph node sampling 07/14/2020 showed a pT2 pN1(mic), stage IIA invasive ductal carcinoma, grade 2, with a positive superior margin  (a) additional surgery 08/03/2020  (3) MammaPrint obtained from the initial biopsy showed high risk, predicting a 93% 5-year disease-free survival with chemotherapy and significant benefit from chemotherapy (greater than 12%).  (4) cyclophosphamide and doxorubicin in dose dense fashion x4 to start 08/03/2020, to be followed by paclitaxel weekly x12  (a) echo 07/09/2020 shows an ejection fraction in the 60-65% range.  (5) adjuvant radiation  to follow  (6) antiestrogens to start at the completion of local treatment   PLAN: Alyssa is tolerating her treatment generally quite well and she will have the third of 4 doses of CA today.  She will return in 2 days for her growth factor and in 14 days for her final cycle.  She has requested to have the weekly paclitaxel treatment on Friday so she can work the other 4 weeks.  I have entered those orders.  She has developed a mild case of rosacea.  I wrote for MetroGel for her.  She will let me know if that is not effective  She would like to see her orthopedist Dr. Lanae Boast.  If any invasive procedures including needles are involved it would be best for Lakeyn's counts to be in the normal range and that would be most likely a couple of days before her next [facility] treatment or 2 weeks after that.  She knows to call for any other issues that may develop before the next visit  Total encounter time 25 minutes.Sarajane Jews C. Magrinat, MD 09/14/20 6:35 PM Medical Oncology and Hematology [facility] [place], [place] [address] Tel. [phone]    Fax. [phone]   I, Wilburn Mylar, am acting as scribe for Dr. Virgie Dad. Magrinat.  I, Lurline Del MD, have reviewed the above documentation for accuracy and completeness, and I agree with the above.    *Total Encounter Time as defined by the Centers for Medicare and Medicaid Services includes, in addition to the face-to-face time of a patient visit (documented in the note above) non-face-to-face time: obtaining and reviewing outside history, ordering and reviewing medications, tests or procedures, care coordination (communications with other health care professionals or caregivers) and documentation in the medical record.

## 2020-09-14 ENCOUNTER — Encounter: Payer: Self-pay | Admitting: Oncology

## 2020-09-14 ENCOUNTER — Encounter: Payer: Self-pay | Admitting: *Deleted

## 2020-09-14 ENCOUNTER — Inpatient Hospital Stay: Payer: BC Managed Care – PPO

## 2020-09-14 ENCOUNTER — Other Ambulatory Visit: Payer: Self-pay | Admitting: Oncology

## 2020-09-14 ENCOUNTER — Inpatient Hospital Stay: Payer: BC Managed Care – PPO | Attending: Oncology

## 2020-09-14 ENCOUNTER — Inpatient Hospital Stay (HOSPITAL_BASED_OUTPATIENT_CLINIC_OR_DEPARTMENT_OTHER): Payer: BC Managed Care – PPO | Admitting: Oncology

## 2020-09-14 ENCOUNTER — Other Ambulatory Visit: Payer: Self-pay

## 2020-09-14 VITALS — BP 106/93 | HR 63 | Temp 97.5°F | Resp 18 | Ht 65.0 in | Wt 163.1 lb

## 2020-09-14 DIAGNOSIS — Z170 Estrogen receptor positive status [ER+]: Secondary | ICD-10-CM | POA: Diagnosis not present

## 2020-09-14 DIAGNOSIS — Z5111 Encounter for antineoplastic chemotherapy: Secondary | ICD-10-CM | POA: Diagnosis present

## 2020-09-14 DIAGNOSIS — C50411 Malignant neoplasm of upper-outer quadrant of right female breast: Secondary | ICD-10-CM

## 2020-09-14 DIAGNOSIS — Z5189 Encounter for other specified aftercare: Secondary | ICD-10-CM | POA: Diagnosis not present

## 2020-09-14 DIAGNOSIS — R112 Nausea with vomiting, unspecified: Secondary | ICD-10-CM

## 2020-09-14 DIAGNOSIS — L719 Rosacea, unspecified: Secondary | ICD-10-CM | POA: Diagnosis not present

## 2020-09-14 DIAGNOSIS — Z95828 Presence of other vascular implants and grafts: Secondary | ICD-10-CM

## 2020-09-14 LAB — COMPREHENSIVE METABOLIC PANEL
ALT: 16 U/L (ref 0–44)
AST: 15 U/L (ref 15–41)
Albumin: 3.9 g/dL (ref 3.5–5.0)
Alkaline Phosphatase: 91 U/L (ref 38–126)
Anion gap: 5 (ref 5–15)
BUN: 12 mg/dL (ref 6–20)
CO2: 25 mmol/L (ref 22–32)
Calcium: 8.6 mg/dL — ABNORMAL LOW (ref 8.9–10.3)
Chloride: 108 mmol/L (ref 98–111)
Creatinine, Ser: 0.71 mg/dL (ref 0.44–1.00)
GFR, Estimated: >60 mL/min (ref >60)
Glucose, Bld: 109 mg/dL — ABNORMAL HIGH (ref 70–99)
Potassium: 3.9 mmol/L (ref 3.5–5.1)
Sodium: 138 mmol/L (ref 135–145)
Total Bilirubin: 0.3 mg/dL (ref 0.3–1.2)
Total Protein: 6.6 g/dL (ref 6.5–8.1)

## 2020-09-14 LAB — CBC WITH DIFFERENTIAL/PLATELET
Abs Immature Granulocytes: 0.89 10*3/uL — ABNORMAL HIGH (ref 0.00–0.07)
Basophils Absolute: 0.1 10*3/uL (ref 0.0–0.1)
Basophils Relative: 1 %
Eosinophils Absolute: 0 10*3/uL (ref 0.0–0.5)
Eosinophils Relative: 0 %
HCT: 29.3 % — ABNORMAL LOW (ref 36.0–46.0)
Hemoglobin: 10.2 g/dL — ABNORMAL LOW (ref 12.0–15.0)
Immature Granulocytes: 9 %
Lymphocytes Relative: 11 %
Lymphs Abs: 1.1 10*3/uL (ref 0.7–4.0)
MCH: 33.1 pg (ref 26.0–34.0)
MCHC: 34.8 g/dL (ref 30.0–36.0)
MCV: 95.1 fL (ref 80.0–100.0)
Monocytes Absolute: 0.9 10*3/uL (ref 0.1–1.0)
Monocytes Relative: 9 %
Neutro Abs: 7.4 10*3/uL (ref 1.7–7.7)
Neutrophils Relative %: 70 %
Platelets: 193 10*3/uL (ref 150–400)
RBC: 3.08 MIL/uL — ABNORMAL LOW (ref 3.87–5.11)
RDW: 12.3 % (ref 11.5–15.5)
WBC: 10.3 10*3/uL (ref 4.0–10.5)
nRBC: 0 % (ref 0.0–0.2)

## 2020-09-14 LAB — PREGNANCY, URINE: Preg Test, Ur: NEGATIVE

## 2020-09-14 MED ORDER — DOXORUBICIN HCL CHEMO IV INJECTION 2 MG/ML
60.0000 mg/m2 | Freq: Once | INTRAVENOUS | Status: AC
  Administered 2020-09-14: 110 mg via INTRAVENOUS
  Filled 2020-09-14: qty 55

## 2020-09-14 MED ORDER — PALONOSETRON HCL INJECTION 0.25 MG/5ML
INTRAVENOUS | Status: AC
  Filled 2020-09-14: qty 5

## 2020-09-14 MED ORDER — PALONOSETRON HCL INJECTION 0.25 MG/5ML
0.2500 mg | Freq: Once | INTRAVENOUS | Status: AC
  Administered 2020-09-14: 0.25 mg via INTRAVENOUS

## 2020-09-14 MED ORDER — SODIUM CHLORIDE 0.9% FLUSH
10.0000 mL | INTRAVENOUS | Status: DC | PRN
  Administered 2020-09-14: 10 mL
  Filled 2020-09-14: qty 10

## 2020-09-14 MED ORDER — DOCUSATE SODIUM 100 MG PO CAPS: 200.0000 mg | ORAL_CAPSULE | Freq: Two times a day (BID) | ORAL | 0 refills | Status: DC

## 2020-09-14 MED ORDER — SODIUM CHLORIDE 0.9 % IV SOLN
150.0000 mg | Freq: Once | INTRAVENOUS | Status: AC
  Administered 2020-09-14: 150 mg via INTRAVENOUS
  Filled 2020-09-14: qty 150

## 2020-09-14 MED ORDER — SODIUM CHLORIDE 0.9% FLUSH
10.0000 mL | INTRAVENOUS | Status: DC | PRN
  Administered 2020-09-14: 10 mL via INTRAVENOUS
  Filled 2020-09-14: qty 10

## 2020-09-14 MED ORDER — SODIUM CHLORIDE 0.9 % IV SOLN
10.0000 mg | Freq: Once | INTRAVENOUS | Status: AC
  Administered 2020-09-14: 10 mg via INTRAVENOUS
  Filled 2020-09-14: qty 10

## 2020-09-14 MED ORDER — HEPARIN SOD (PORK) LOCK FLUSH 100 UNIT/ML IV SOLN
500.0000 [IU] | Freq: Once | INTRAVENOUS | Status: AC | PRN
  Administered 2020-09-14: 500 [IU]
  Filled 2020-09-14: qty 5

## 2020-09-14 MED ORDER — LORAZEPAM 2 MG/ML IJ SOLN
0.5000 mg | Freq: Once | INTRAMUSCULAR | Status: AC
  Administered 2020-09-14: 0.5 mg via INTRAVENOUS

## 2020-09-14 MED ORDER — SODIUM CHLORIDE 0.9 % IV SOLN
600.0000 mg/m2 | Freq: Once | INTRAVENOUS | Status: AC
  Administered 2020-09-14: 1100 mg via INTRAVENOUS
  Filled 2020-09-14: qty 55

## 2020-09-14 MED ORDER — LORAZEPAM 2 MG/ML IJ SOLN
INTRAMUSCULAR | Status: AC
  Filled 2020-09-14: qty 1

## 2020-09-14 MED ORDER — SODIUM CHLORIDE 0.9 % IV SOLN
Freq: Once | INTRAVENOUS | Status: AC
Start: 2020-09-14 — End: 2020-09-14
  Filled 2020-09-14: qty 250

## 2020-09-14 MED ORDER — METRONIDAZOLE 1 % EX GEL: Freq: Every day | CUTANEOUS | 0 refills | Status: DC

## 2020-09-14 MED FILL — metroNIDAZOLE 1 % GEL: 1 | 30 days supply | Qty: 60 | Fill #0

## 2020-09-14 NOTE — Progress Notes (Signed)
Received approval letter for [facility] via fax from [place] [employer].  Patient successfully enrolled. She will have $0 out-of-pocket costs after insurance pays for [facility] with up to maximum benefits of $15,000 over the next 12 months, per program requirements.  Copy of approval letter given to [facility] for billing/copay submissions.  Patient will receive a copy for her records only. She has my card for any additional financial questions or concerns.

## 2020-09-14 NOTE — Patient Instructions (Signed)
[facility] Discharge Instructions for Patients Receiving Chemotherapy  Today you received the following chemotherapy agents Adriamycin; Cytoxin  To help prevent nausea and vomiting after your treatment, we encourage you to take your nausea medication as directed If you develop nausea and vomiting that is not controlled by your nausea medication, call the clinic.   BELOW ARE SYMPTOMS THAT SHOULD BE REPORTED IMMEDIATELY:  *FEVER GREATER THAN 100.5 F  *CHILLS WITH OR WITHOUT FEVER  NAUSEA AND VOMITING THAT IS NOT CONTROLLED WITH YOUR NAUSEA MEDICATION  *UNUSUAL SHORTNESS OF BREATH  *UNUSUAL BRUISING OR BLEEDING  TENDERNESS IN MOUTH AND THROAT WITH OR WITHOUT PRESENCE OF ULCERS  *URINARY PROBLEMS  *BOWEL PROBLEMS  UNUSUAL RASH Items with * indicate a potential emergency and should be followed up as soon as possible.  Feel free to call the clinic should you have any questions or concerns. The clinic phone number is [phone].  Please show the CHEMO ALERT CARD at check-in to the Emergency Department and triage nurse.   

## 2020-09-14 NOTE — Progress Notes (Signed)
Wasted 0.5 mg1.63mL ativan with Facilities manager

## 2020-09-14 NOTE — Progress Notes (Signed)
Met with patient at registration to confirm opportunity for available copay assistance for Udenyca.Patient consented to enroll.  Enrolled patient in copay program via [place] [employer] online. Awaiting confirmation via fax.  Patient has my card for any additional financial questions or concerns.

## 2020-09-14 NOTE — Patient Instructions (Signed)

## 2020-09-15 ENCOUNTER — Telehealth: Payer: Self-pay | Admitting: Oncology

## 2020-09-15 ENCOUNTER — Telehealth: Payer: Self-pay | Admitting: *Deleted

## 2020-09-15 ENCOUNTER — Encounter: Payer: Self-pay | Admitting: Oncology

## 2020-09-15 NOTE — Telephone Encounter (Signed)
Scheduled appts per 1/5 los. Pt to get updated appt calendar at next visit per appt notes.  °

## 2020-09-15 NOTE — Telephone Encounter (Addendum)
This RN called pt per her my chart message stating ongoing nausea post treatment yesterday .  She states she is not actively vomiting " just severe nausea especially if I bend over or try to move too much "  Pt states " maybe I just need to make myself vomit so I can get over it"  This RN informed pt above is not advisable- reviewed that she has taken prochlorperazine with minimal benefit in the feeling of nausea.  Per phone discussion- and meds in the home- Sacha will take an ativan presently- this RN will call to follow up later today.  This RN called pt back later in day- she states the " ativan did the trick and I am feeling good"- She states she is able to eat and drink presently including that she has been hydrating since nausea improved " and my urine is more clear "  This RN discussed above nausea and possible cause associated anticipation relating to treatment.  Plan at present - she understands she may use the ativan 0.5 mg q 6 hours for the nausea if needed. She will call this RN in the AM if nausea returned for possible need for IV hydration - when she comes in 1/13 for her neulasta injection.  This RN discussed what anticipatory nausea is - and possible benefit of use of ativan prior to coming in for treatment.  No further needs at this time.  MD has been made aware of above.

## 2020-09-16 ENCOUNTER — Inpatient Hospital Stay: Payer: BC Managed Care – PPO

## 2020-09-16 ENCOUNTER — Other Ambulatory Visit: Payer: Self-pay

## 2020-09-16 VITALS — BP 107/53 | HR 66 | Temp 98.1°F | Resp 18

## 2020-09-16 DIAGNOSIS — C50411 Malignant neoplasm of upper-outer quadrant of right female breast: Secondary | ICD-10-CM

## 2020-09-16 DIAGNOSIS — Z170 Estrogen receptor positive status [ER+]: Secondary | ICD-10-CM

## 2020-09-16 DIAGNOSIS — Z5111 Encounter for antineoplastic chemotherapy: Secondary | ICD-10-CM | POA: Diagnosis not present

## 2020-09-16 MED ORDER — PEGFILGRASTIM-CBQV 6 MG/0.6ML ~~LOC~~ SOSY
PREFILLED_SYRINGE | SUBCUTANEOUS | Status: AC
  Filled 2020-09-16: qty 0.6

## 2020-09-16 MED ORDER — PEGFILGRASTIM-CBQV 6 MG/0.6ML ~~LOC~~ SOSY
6.0000 mg | PREFILLED_SYRINGE | Freq: Once | SUBCUTANEOUS | Status: AC
  Administered 2020-09-16: 6 mg via SUBCUTANEOUS

## 2020-09-16 NOTE — Patient Instructions (Signed)
Pegfilgrastim injection What is this medicine? PEGFILGRASTIM (PEG fil gra stim) is a long-acting granulocyte colony-stimulating factor that stimulates the growth of neutrophils, a type of white blood cell important in the body's fight against infection. It is used to reduce the incidence of fever and infection in patients with certain types of cancer who are receiving chemotherapy that affects the bone marrow, and to increase survival after being exposed to high doses of radiation. This medicine may be used for other purposes; ask your health care provider or pharmacist if you have questions. COMMON BRAND NAME(S): Fulphila, Neulasta, Nyvepria, UDENYCA, Ziextenzo What should I tell my health care provider before I take this medicine? They need to know if you have any of these conditions:  kidney disease  latex allergy  ongoing radiation therapy  sickle cell disease  skin reactions to acrylic adhesives (On-Body Injector only)  an unusual or allergic reaction to pegfilgrastim, filgrastim, other medicines, foods, dyes, or preservatives  pregnant or trying to get pregnant  breast-feeding How should I use this medicine? This medicine is for injection under the skin. If you get this medicine at home, you will be taught how to prepare and give the pre-filled syringe or how to use the On-body Injector. Refer to the patient Instructions for Use for detailed instructions. Use exactly as directed. Tell your healthcare provider immediately if you suspect that the On-body Injector may not have performed as intended or if you suspect the use of the On-body Injector resulted in a missed or partial dose. It is important that you put your used needles and syringes in a special sharps container. Do not put them in a trash can. If you do not have a sharps container, call your pharmacist or healthcare provider to get one. Talk to your pediatrician regarding the use of this medicine in children. While this drug  may be prescribed for selected conditions, precautions do apply. Overdosage: If you think you have taken too much of this medicine contact a poison control center or emergency room at once. NOTE: This medicine is only for you. Do not share this medicine with others. What if I miss a dose? It is important not to miss your dose. Call your doctor or health care professional if you miss your dose. If you miss a dose due to an On-body Injector failure or leakage, a new dose should be administered as soon as possible using a single prefilled syringe for manual use. What may interact with this medicine? Interactions have not been studied. This list may not describe all possible interactions. Give your health care provider a list of all the medicines, herbs, non-prescription drugs, or dietary supplements you use. Also tell them if you smoke, drink alcohol, or use illegal drugs. Some items may interact with your medicine. What should I watch for while using this medicine? Your condition will be monitored carefully while you are receiving this medicine. You may need blood work done while you are taking this medicine. Talk to your health care provider about your risk of cancer. You may be more at risk for certain types of cancer if you take this medicine. If you are going to need a MRI, CT scan, or other procedure, tell your doctor that you are using this medicine (On-Body Injector only). What side effects may I notice from receiving this medicine? Side effects that you should report to your doctor or health care professional as soon as possible:  allergic reactions (skin rash, itching or hives, swelling of   the face, lips, or tongue)  back pain  dizziness  fever  pain, redness, or irritation at site where injected  pinpoint red spots on the skin  red or dark-brown urine  shortness of breath or breathing problems  stomach or side pain, or pain at the shoulder  swelling  tiredness  trouble  passing urine or change in the amount of urine  unusual bruising or bleeding Side effects that usually do not require medical attention (report to your doctor or health care professional if they continue or are bothersome):  bone pain  muscle pain This list may not describe all possible side effects. Call your doctor for medical advice about side effects. You may report side effects to FDA at 1-800-FDA-1088. Where should I keep my medicine? Keep out of the reach of children. If you are using this medicine at home, you will be instructed on how to store it. Throw away any unused medicine after the expiration date on the label. NOTE: This sheet is a summary. It may not cover all possible information. If you have questions about this medicine, talk to your doctor, pharmacist, or health care provider.  2021 Elsevier/Gold Standard (2019-09-12 13:20:51)  

## 2020-09-27 NOTE — Progress Notes (Signed)
[place]  [phone] [phone]     ID: Autumn Bullock DOB: 1976/04/10  MR#: [id number]  HDQ#:[id number]  Patient Care Team: Brock Ra, PA-C as PCP - General Mauro Kaufmann, RN as Oncology Nurse Navigator Rockwell Germany, RN as Oncology Nurse Navigator Donnie Mesa, MD as Consulting Physician (General Surgery) Eppie Gibson, MD as Attending Physician (Radiation Oncology) Tatyanna Cronk, Virgie Dad, MD as Consulting Physician (Oncology) Vanessa Kick, MD as Consulting Physician (Obstetrics and Gynecology) Lovett Calender, MD as Consulting Physician (Orthopedic Surgery) Chauncey Cruel, MD OTHER MD:  CHIEF COMPLAINT: Estrogen receptor positive breast cancer  CURRENT TREATMENT: Adjuvant chemotherapy   INTERVAL HISTORY: Autumn Bullock returns today for follow up and treatment of her estrogen receptor positive breast cancer. She is accompanied by her mother  Lysandra is receiving doxorubicin and cylophsopahmide given every 2 weeks with udenyca growth factor support on day three, to be followed by weekly paclitaxel x12.. Today is day 1 cycle 4 of her AC treatments.   She experienced nausea following her last cycle, which was relieved with ativan (per telephone conversation with nurse Val).   REVIEW OF SYSTEMS: Earleen had more problems with vomiting last time and we are going to be using the lorazepam as needed. I am also adding it to her premeds today. She is strongly considering going back to work. 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 Aside from all that as she has some constipation. She does not like to take MiraLAX until the 3rd day at least but she is willing to try something else perhaps milk of magnesia on the first day if she becomes constipated. Her rosacea got much better with the MetroGel but  then a few days after she stopped it it came back worse. She has some doxycycline on hand and we discussed using both. A detailed review of systems was otherwise stable   COVID 19 VACCINATION STATUS: Status post Pfizer x2, most recent treatment April 2021.  Patient also was diagnosed with COVID-19 disease November 2020   HISTORY OF CURRENT ILLNESS: From the original intake note:  Autumn Bullock had routine screening mammography on 06/02/2020 showing a possible abnormality in the right breast. She underwent right diagnostic mammography with tomography and right breast ultrasonography at [facility] on 06/08/2020 showing: breast density category C; 2.3 cm right breast mass at 11:30; 0.8 cm mass superficial to dominant mass; no right axillary adenopathy.  Accordingly on 06/08/2020 she proceeded to biopsy of the right breast area in question. The pathology from this procedure ([id number]) showed: invasive and in situ mammary carcinoma, grade 2, e-cadherin positive. Both biopsied masses showed this and were found to be morphologically similar. Prognostic indicators significant for: estrogen receptor, 90% positive and progesterone receptor, 70% positive, both with strong staining intensity. Proliferation marker Ki67 at 5%. HER2 negative by immunohistochemistry (1+).  The patient's subsequent history is as detailed below.   PAST MEDICAL HISTORY: Past Medical History:  Diagnosis Date  . Family history of uterine cancer 06/16/2020  . PONV (postoperative nausea and vomiting)     PAST SURGICAL HISTORY: Past Surgical History:  Procedure Laterality Date  . BREAST LUMPECTOMY WITH RADIOACTIVE SEED AND SENTINEL LYMPH NODE BIOPSY Right 07/14/2020   Procedure: RIGHT BREAST LUMPECTOMY WITH BRACKETED RADIOACTIVE SEED AND SENTINEL LYMPH NODE BIOPSY, BLUE DYE INJECTION;  Surgeon: Donnie Mesa, MD;  Location: [facility];  Service: General;  Laterality: Right;  PEC BLOCK, BLUE DYE INJECTION  .  CESAREAN SECTION     2011  . PORTACATH PLACEMENT Right 07/14/2020   Procedure: INSERTION PORT-A-CATH WITH ULTRASOUND GUIDANCE;  Surgeon: Donnie Mesa, MD;  Location: [facility];  Service: General;  Laterality: Right;  . RE-EXCISION OF BREAST LUMPECTOMY Right 08/03/2020   Procedure: RE-EXCISION OF RIGHT BREAST LUMPECTOMY SUPERIOR MARGINS;  Surgeon: Donnie Mesa, MD;  Location: [facility];  Service: General;  Laterality: Right;  LMA    FAMILY HISTORY: Family History  Problem Relation Age of Onset  . Cancer Maternal Grandmother 72       Unknown GYN.  ?Uterine  As of October 2021 her parents are both living, her father at age 68 and her mother at 17, . Autumn Bullock has 1 brother and 2 sisters. She reports uterine cancer in her maternal grandmother at age 31. There is no family history of breast, ovarian, or colon cancer to her knowledge.   GYNECOLOGIC HISTORY:  No LMP recorded. Menarche: 45 years old Age at first live birth: 45 years old [place] P 1 LMP 05/05/2020, regular monthly periods, lasting 3 days with 1-2 heavy days Contraceptive: has used for 15 years, no complications HRT n/a  Hysterectomy? no BSO? 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 Husband [name] works in Nature conservation officer. She lives at home with [name] and their daughter [name], age 51. She attends [employer] in [place].    ADVANCED DIRECTIVES: In the absence of any documentation to the contrary, the patient's spouse is their HCPOA.    HEALTH MAINTENANCE: Social History   Tobacco Use  . Smoking status: Never Smoker  . Smokeless tobacco: Never Used  Substance Use Topics  . Alcohol use: Yes    Comment: 7/week  . Drug use: Never     Colonoscopy: 1999?  PAP: 05/31/2020  Bone density: never done   Allergies  Allergen Reactions  . Wound Dressing Adhesive     Tape etc     Current Outpatient Medications   Medication Sig Dispense Refill  . dexamethasone (DECADRON) 4 MG tablet Take 2 tablets daily for 3 days after chemo. Take with food. 30 tablet 1  . docusate sodium (COLACE) 100 MG capsule Take 2 capsules (200 mg total) by mouth 2 (two) times daily. 80 capsule 0  . doxycycline (VIBRA-TABS) 100 MG tablet Take 1 tablet (100 mg total) by mouth daily. 60 tablet 1  . ketoconazole (NIZORAL) 2 % cream Apply 1 application topically daily. 15 g 0  . lidocaine-prilocaine (EMLA) cream Apply to affected area once 30 g 3  . LORazepam (ATIVAN) 0.5 MG tablet Take 1 tablet (0.5 mg total) by mouth at bedtime as needed (Nausea or vomiting). 30 tablet 0  . metroNIDAZOLE (METROGEL) 1 % gel Apply topically daily. 45 g 0  . omeprazole (PRILOSEC) 40 MG capsule Take 1 capsule (40 mg total) by mouth at bedtime. 90 capsule 4  . ondansetron (ZOFRAN ODT) 4 MG disintegrating tablet Take 1 tablet (4 mg total) by mouth every 8 (eight) hours as needed for nausea or vomiting. 20 tablet 2  . prochlorperazine (COMPAZINE) 10 MG tablet Take 1 tablet (10 mg total) by mouth every 6 (six) hours as needed (Nausea or vomiting). 30 tablet 1   No current facility-administered medications for this visit.   Facility-Administered Medications Ordered in Other Visits  Medication  Dose Route Frequency Provider Last Rate Last Admin  . sodium chloride flush (NS) 0.9 % injection 10 mL  10 mL Intracatheter PRN Adhvik Canady, Virgie Dad, MD   10 mL at 09/28/20 1616    OBJECTIVE: White woman in no acute distress  Vitals:   09/28/20 1255  BP: (!) 105/56  Pulse: 72  Resp: 20  Temp: (!) 97.5 F (36.4 C)  SpO2: 100%     Body mass index is 26.86 kg/m.   Wt Readings from Last 3 Encounters:  09/28/20 161 lb 6.4 oz (73.2 kg)  09/14/20 163 lb 1.6 oz (74 kg)  09/02/20 160 lb 14.4 oz (73 kg)      ECOG FS:1 - Symptomatic but completely ambulatory  Sclerae unicteric, EOMs intact Wearing a mask No cervical or supraclavicular adenopathy Lungs no rales  or rhonchi Heart regular rate and rhythm Abd soft, nontender, positive bowel sounds MSK no focal spinal tenderness, no upper extremity lymphedema Neuro: nonfocal, well oriented, appropriate affect Breasts: Deferred   LAB RESULTS:  CMP     Component Value Date/Time   NA 138 09/28/2020 1243   K 4.1 09/28/2020 1243   CL 106 09/28/2020 1243   CO2 25 09/28/2020 1243   GLUCOSE 103 (H) 09/28/2020 1243   BUN 13 09/28/2020 1243   CREATININE 0.83 09/28/2020 1243   CREATININE 0.73 08/17/2020 0905   CALCIUM 8.9 09/28/2020 1243   PROT 7.1 09/28/2020 1243   ALBUMIN 4.2 09/28/2020 1243   AST 16 09/28/2020 1243   AST 18 08/17/2020 0905   ALT 16 09/28/2020 1243   ALT 18 08/17/2020 0905   ALKPHOS 101 09/28/2020 1243   BILITOT 0.4 09/28/2020 1243   BILITOT 0.7 08/17/2020 0905   GFRNONAA >60 09/28/2020 1243   GFRNONAA >60 08/17/2020 0905    No results found for: TOTALPROTELP, ALBUMINELP, A1GS, A2GS, BETS, BETA2SER, GAMS, MSPIKE, SPEI  Lab Results  Component Value Date   WBC 11.1 (H) 09/28/2020   NEUTROABS 7.4 09/28/2020   HGB 9.4 (L) 09/28/2020   HCT 27.0 (L) 09/28/2020   MCV 95.1 09/28/2020   PLT 202 09/28/2020    No results found for: LABCA2  No components found for: ZOXWRU045  No results for input(s): INR in the last 168 hours.  No results found for: LABCA2  No results found for: WUJ811  No results found for: BJY782  No results found for: NFA213  No results found for: CA2729  No components found for: HGQUANT  No results found for: CEA1 / No results found for: CEA1   No results found for: AFPTUMOR  No results found for: CHROMOGRNA  No results found for: KPAFRELGTCHN, LAMBDASER, KAPLAMBRATIO (kappa/lambda light chains)  No results found for: HGBA, HGBA2QUANT, HGBFQUANT, HGBSQUAN (Hemoglobinopathy evaluation)   No results found for: LDH  No results found for: IRON, TIBC, IRONPCTSAT (Iron and TIBC)  No results found for: FERRITIN  Urinalysis     Component Value Date/Time   COLORURINE YELLOW 09/02/2020 0843   APPEARANCEUR HAZY (A) 09/02/2020 0843   LABSPEC 1.010 09/02/2020 0843   PHURINE 6.0 09/02/2020 0843   GLUCOSEU NEGATIVE 09/02/2020 0843   HGBUR SMALL (A) 09/02/2020 0843   BILIRUBINUR NEGATIVE 09/02/2020 0843   KETONESUR NEGATIVE 09/02/2020 0843   PROTEINUR NEGATIVE 09/02/2020 0843   NITRITE NEGATIVE 09/02/2020 0843   LEUKOCYTESUR TRACE (A) 09/02/2020 0843    STUDIES: No results found.   ELIGIBLE FOR AVAILABLE RESEARCH PROTOCOL: AET  ASSESSMENT: 45 y.o. [place] woman status post right breast upper outer  quadrant biopsy 06/08/2020 for a clinical mT2 N0, stage IB invasive ductal carcinoma, grade 2, estrogen and progesterone receptor positive, HER-2 not amplified, with an MIB-1 of 5%.  (1) genetics testing 06/22/2020 through the Common Hereditary Cancers Panel offered by [facility] found no deleterious mutations in APC, ATM, AXIN2, BARD1, BMPR1A, BRCA1, BRCA2, BRIP1, CDH1, CDK4, CDKN2A (p14ARF), CDKN2A (p16INK4a), CHEK2, CTNNA1, DICER1, EPCAM (Deletion/duplication testing only), GREM1 (promoter region deletion/duplication testing only), KIT, MEN1, MLH1, MSH2, MSH3, MSH6, MUTYH, NBN, NF1, NHTL1, PALB2, PDGFRA, PMS2, POLD1, POLE, PTEN, RAD50, RAD51C, RAD51D, RNF43, SDHB, SDHC, SDHD, SMAD4, SMARCA4. STK11, TP53, TSC1, TSC2, and VHL.  The following genes were evaluated for sequence changes only: SDHA and HOXB13 c.251G>A variant only.  (a)  a variant of uncertain significance in MSH3 at c.2731T>G (p.Leu911Val).   (2) right lumpectomy and sentinel lymph node sampling 07/14/2020 showed a pT2 pN1(mic), stage IIA invasive ductal carcinoma, grade 2, with a positive superior margin  (a) additional surgery 08/03/2020  (3) MammaPrint obtained from the initial biopsy showed high risk, predicting a 93% 5-year disease-free survival with chemotherapy and significant benefit from chemotherapy (greater than 12%).  (4) cyclophosphamide and  doxorubicin in dose dense fashion x4 to start 08/03/2020, to be followed by paclitaxel weekly x12  (a) echo 07/09/2020 shows an ejection fraction in the 60-65% range.  (5) adjuvant radiation to follow  (6) antiestrogens to start at the completion of local treatment   PLAN: Ryder completes the more intense portion of her chemotherapy today. She has tolerated it generally well although she has had more trouble with the 3rd cycle and possibly may have similar issues with this one. We have added lorazepam and she does have some at home to take as needed. Otherwise she will continue on the Decadron and Compazine.  She is very keen to return to work. 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 They are likely to be carriers and they do not understand the need to protect others. It is true that Breniya's immune system is only partially and temporarily suppressed. My recommendation is to wait until she is done with chemo to return to work, but what she would prefer to do is receive her first 2 cycles of paclitaxel and then see how she is doing with that.  She would prefer to receive the paclitaxel on Fridays, which would be particularly helpful if she does return to work. However I am trying to not work on Fridays. Accordingly she will see my nurse practitioner on 02/11 and 02/18. She will then have her 3rd dose on a Thursday so she can check with me on how she is doing with treatment and how that might affect her ability to return to work  She understands that she will not take dexamethasone on days 2 and 3 with her Taxol treatments and therefore her rosacea will improve. In the meantime she will use both doxycycline and MetroGel as needed.  Total encounter time 25 minutes.Sarajane Jews C. Amiria Orrison, MD 09/28/20 5:41 PM Medical Oncology and Hematology [facility] [place], [place] [address] Tel.  [phone]    Fax. [phone]   I, Wilburn Mylar, am acting as scribe for Dr. Virgie Dad. Taneesha Edgin.  I, Lurline Del MD, have reviewed the above documentation for accuracy and completeness, and I agree with the above.   *Total Encounter Time as defined by the Centers  for Medicare and Medicaid Services includes, in addition to the face-to-face time of a patient visit (documented in the note above) non-face-to-face time: obtaining and reviewing outside history, ordering and reviewing medications, tests or procedures, care coordination (communications with other health care professionals or caregivers) and documentation in the medical record.

## 2020-09-28 ENCOUNTER — Inpatient Hospital Stay: Payer: BC Managed Care – PPO

## 2020-09-28 ENCOUNTER — Other Ambulatory Visit: Payer: Self-pay

## 2020-09-28 ENCOUNTER — Inpatient Hospital Stay (HOSPITAL_BASED_OUTPATIENT_CLINIC_OR_DEPARTMENT_OTHER): Payer: BC Managed Care – PPO | Admitting: Oncology

## 2020-09-28 VITALS — BP 105/56 | HR 72 | Temp 97.5°F | Resp 20 | Ht 65.0 in | Wt 161.4 lb

## 2020-09-28 DIAGNOSIS — C50411 Malignant neoplasm of upper-outer quadrant of right female breast: Secondary | ICD-10-CM

## 2020-09-28 DIAGNOSIS — Z170 Estrogen receptor positive status [ER+]: Secondary | ICD-10-CM | POA: Diagnosis not present

## 2020-09-28 DIAGNOSIS — Z5111 Encounter for antineoplastic chemotherapy: Secondary | ICD-10-CM | POA: Diagnosis not present

## 2020-09-28 LAB — COMPREHENSIVE METABOLIC PANEL
ALT: 16 U/L (ref 0–44)
AST: 16 U/L (ref 15–41)
Albumin: 4.2 g/dL (ref 3.5–5.0)
Alkaline Phosphatase: 101 U/L (ref 38–126)
Anion gap: 7 (ref 5–15)
BUN: 13 mg/dL (ref 6–20)
CO2: 25 mmol/L (ref 22–32)
Calcium: 8.9 mg/dL (ref 8.9–10.3)
Chloride: 106 mmol/L (ref 98–111)
Creatinine, Ser: 0.83 mg/dL (ref 0.44–1.00)
GFR, Estimated: >60 mL/min (ref >60)
Glucose, Bld: 103 mg/dL — ABNORMAL HIGH (ref 70–99)
Potassium: 4.1 mmol/L (ref 3.5–5.1)
Sodium: 138 mmol/L (ref 135–145)
Total Bilirubin: 0.4 mg/dL (ref 0.3–1.2)
Total Protein: 7.1 g/dL (ref 6.5–8.1)

## 2020-09-28 LAB — CBC WITH DIFFERENTIAL/PLATELET
Abs Immature Granulocytes: 1.46 10*3/uL — ABNORMAL HIGH (ref 0.00–0.07)
Basophils Absolute: 0.1 10*3/uL (ref 0.0–0.1)
Basophils Relative: 0 %
Eosinophils Absolute: 0 10*3/uL (ref 0.0–0.5)
Eosinophils Relative: 0 %
HCT: 27 % — ABNORMAL LOW (ref 36.0–46.0)
Hemoglobin: 9.4 g/dL — ABNORMAL LOW (ref 12.0–15.0)
Immature Granulocytes: 13 %
Lymphocytes Relative: 10 %
Lymphs Abs: 1.1 10*3/uL (ref 0.7–4.0)
MCH: 33.1 pg (ref 26.0–34.0)
MCHC: 34.8 g/dL (ref 30.0–36.0)
MCV: 95.1 fL (ref 80.0–100.0)
Monocytes Absolute: 1.1 10*3/uL — ABNORMAL HIGH (ref 0.1–1.0)
Monocytes Relative: 10 %
Neutro Abs: 7.4 10*3/uL (ref 1.7–7.7)
Neutrophils Relative %: 67 %
Platelets: 202 10*3/uL (ref 150–400)
RBC: 2.84 MIL/uL — ABNORMAL LOW (ref 3.87–5.11)
RDW: 14 % (ref 11.5–15.5)
WBC: 11.1 10*3/uL — ABNORMAL HIGH (ref 4.0–10.5)
nRBC: 0.4 % — ABNORMAL HIGH (ref 0.0–0.2)

## 2020-09-28 LAB — PREGNANCY, URINE: Preg Test, Ur: NEGATIVE

## 2020-09-28 MED ORDER — DOXORUBICIN HCL CHEMO IV INJECTION 2 MG/ML
60.0000 mg/m2 | Freq: Once | INTRAVENOUS | Status: AC
Start: 2020-09-28 — End: 2020-09-28
  Administered 2020-09-28: 110 mg via INTRAVENOUS
  Filled 2020-09-28: qty 55

## 2020-09-28 MED ORDER — SODIUM CHLORIDE 0.9 % IV SOLN
600.0000 mg/m2 | Freq: Once | INTRAVENOUS | Status: AC
  Administered 2020-09-28: 1100 mg via INTRAVENOUS
  Filled 2020-09-28: qty 55

## 2020-09-28 MED ORDER — SODIUM CHLORIDE 0.9% FLUSH
10.0000 mL | INTRAVENOUS | Status: DC | PRN
  Administered 2020-09-28: 10 mL
  Filled 2020-09-28: qty 10

## 2020-09-28 MED ORDER — PALONOSETRON HCL INJECTION 0.25 MG/5ML
INTRAVENOUS | Status: AC
  Filled 2020-09-28: qty 5

## 2020-09-28 MED ORDER — SODIUM CHLORIDE 0.9 % IV SOLN
150.0000 mg | Freq: Once | INTRAVENOUS | Status: AC
  Administered 2020-09-28: 150 mg via INTRAVENOUS
  Filled 2020-09-28: qty 150

## 2020-09-28 MED ORDER — HEPARIN SOD (PORK) LOCK FLUSH 100 UNIT/ML IV SOLN
500.0000 [IU] | Freq: Once | INTRAVENOUS | Status: AC | PRN
  Administered 2020-09-28: 500 [IU]
  Filled 2020-09-28: qty 5

## 2020-09-28 MED ORDER — LORAZEPAM 2 MG/ML IJ SOLN
INTRAMUSCULAR | Status: AC
  Filled 2020-09-28: qty 1

## 2020-09-28 MED ORDER — LORAZEPAM 2 MG/ML IJ SOLN
0.5000 mg | Freq: Once | INTRAMUSCULAR | Status: AC
  Administered 2020-09-28: 0.5 mg via INTRAVENOUS

## 2020-09-28 MED ORDER — SODIUM CHLORIDE 0.9 % IV SOLN
Freq: Once | INTRAVENOUS | Status: AC
  Filled 2020-09-28: qty 250

## 2020-09-28 MED ORDER — PALONOSETRON HCL INJECTION 0.25 MG/5ML
0.2500 mg | Freq: Once | INTRAVENOUS | Status: AC
  Administered 2020-09-28: 0.25 mg via INTRAVENOUS

## 2020-09-28 MED ORDER — SODIUM CHLORIDE 0.9 % IV SOLN
10.0000 mg | Freq: Once | INTRAVENOUS | Status: AC
  Administered 2020-09-28: 10 mg via INTRAVENOUS
  Filled 2020-09-28: qty 10

## 2020-09-28 NOTE — Patient Instructions (Signed)
Cancer Center Discharge Instructions for Patients Receiving Chemotherapy  Today you received the following chemotherapy agents Adriamycin and Cytoxan  To help prevent nausea and vomiting after your treatment, we encourage you to take your nausea medication as directed.  If you develop nausea and vomiting that is not controlled by your nausea medication, call the clinic.   BELOW ARE SYMPTOMS THAT SHOULD BE REPORTED IMMEDIATELY:  *FEVER GREATER THAN 100.5 F  *CHILLS WITH OR WITHOUT FEVER  NAUSEA AND VOMITING THAT IS NOT CONTROLLED WITH YOUR NAUSEA MEDICATION  *UNUSUAL SHORTNESS OF BREATH  *UNUSUAL BRUISING OR BLEEDING  TENDERNESS IN MOUTH AND THROAT WITH OR WITHOUT PRESENCE OF ULCERS  *URINARY PROBLEMS  *BOWEL PROBLEMS  UNUSUAL RASH Items with * indicate a potential emergency and should be followed up as soon as possible.  Feel free to call the clinic should you have any questions or concerns. The clinic phone number is [phone].  Please show the CHEMO ALERT CARD at check-in to the Emergency Department and triage nurse.   

## 2020-09-29 ENCOUNTER — Encounter: Payer: Self-pay | Admitting: Oncology

## 2020-09-29 ENCOUNTER — Telehealth: Payer: Self-pay | Admitting: Oncology

## 2020-09-29 NOTE — Telephone Encounter (Signed)
Scheduled appts per 1/25 los. Pt to get updated appt calendar at next visit per appt notes.  

## 2020-09-30 ENCOUNTER — Inpatient Hospital Stay: Payer: BC Managed Care – PPO

## 2020-09-30 ENCOUNTER — Other Ambulatory Visit: Payer: Self-pay | Admitting: *Deleted

## 2020-09-30 ENCOUNTER — Other Ambulatory Visit: Payer: Self-pay

## 2020-09-30 VITALS — BP 114/53 | HR 74 | Resp 18

## 2020-09-30 DIAGNOSIS — Z170 Estrogen receptor positive status [ER+]: Secondary | ICD-10-CM

## 2020-09-30 DIAGNOSIS — Z5111 Encounter for antineoplastic chemotherapy: Secondary | ICD-10-CM | POA: Diagnosis not present

## 2020-09-30 DIAGNOSIS — C50411 Malignant neoplasm of upper-outer quadrant of right female breast: Secondary | ICD-10-CM

## 2020-09-30 MED ORDER — PEGFILGRASTIM-CBQV 6 MG/0.6ML ~~LOC~~ SOSY
6.0000 mg | PREFILLED_SYRINGE | Freq: Once | SUBCUTANEOUS | Status: AC
  Administered 2020-09-30: 6 mg via SUBCUTANEOUS

## 2020-09-30 MED ORDER — PEGFILGRASTIM-CBQV 6 MG/0.6ML ~~LOC~~ SOSY
PREFILLED_SYRINGE | SUBCUTANEOUS | Status: AC
  Filled 2020-09-30: qty 0.6

## 2020-09-30 NOTE — Patient Instructions (Signed)
Pegfilgrastim injection What is this medicine? PEGFILGRASTIM (PEG fil gra stim) is a long-acting granulocyte colony-stimulating factor that stimulates the growth of neutrophils, a type of white blood cell important in the body's fight against infection. It is used to reduce the incidence of fever and infection in patients with certain types of cancer who are receiving chemotherapy that affects the bone marrow, and to increase survival after being exposed to high doses of radiation. This medicine may be used for other purposes; ask your health care provider or pharmacist if you have questions. COMMON BRAND NAME(S): Fulphila, Neulasta, Nyvepria, UDENYCA, Ziextenzo What should I tell my health care provider before I take this medicine? They need to know if you have any of these conditions:  kidney disease  latex allergy  ongoing radiation therapy  sickle cell disease  skin reactions to acrylic adhesives (On-Body Injector only)  an unusual or allergic reaction to pegfilgrastim, filgrastim, other medicines, foods, dyes, or preservatives  pregnant or trying to get pregnant  breast-feeding How should I use this medicine? This medicine is for injection under the skin. If you get this medicine at home, you will be taught how to prepare and give the pre-filled syringe or how to use the On-body Injector. Refer to the patient Instructions for Use for detailed instructions. Use exactly as directed. Tell your healthcare provider immediately if you suspect that the On-body Injector may not have performed as intended or if you suspect the use of the On-body Injector resulted in a missed or partial dose. It is important that you put your used needles and syringes in a special sharps container. Do not put them in a trash can. If you do not have a sharps container, call your pharmacist or healthcare provider to get one. Talk to your pediatrician regarding the use of this medicine in children. While this drug  may be prescribed for selected conditions, precautions do apply. Overdosage: If you think you have taken too much of this medicine contact a poison control center or emergency room at once. NOTE: This medicine is only for you. Do not share this medicine with others. What if I miss a dose? It is important not to miss your dose. Call your doctor or health care professional if you miss your dose. If you miss a dose due to an On-body Injector failure or leakage, a new dose should be administered as soon as possible using a single prefilled syringe for manual use. What may interact with this medicine? Interactions have not been studied. This list may not describe all possible interactions. Give your health care provider a list of all the medicines, herbs, non-prescription drugs, or dietary supplements you use. Also tell them if you smoke, drink alcohol, or use illegal drugs. Some items may interact with your medicine. What should I watch for while using this medicine? Your condition will be monitored carefully while you are receiving this medicine. You may need blood work done while you are taking this medicine. Talk to your health care provider about your risk of cancer. You may be more at risk for certain types of cancer if you take this medicine. If you are going to need a MRI, CT scan, or other procedure, tell your doctor that you are using this medicine (On-Body Injector only). What side effects may I notice from receiving this medicine? Side effects that you should report to your doctor or health care professional as soon as possible:  allergic reactions (skin rash, itching or hives, swelling of   the face, lips, or tongue)  back pain  dizziness  fever  pain, redness, or irritation at site where injected  pinpoint red spots on the skin  red or dark-brown urine  shortness of breath or breathing problems  stomach or side pain, or pain at the shoulder  swelling  tiredness  trouble  passing urine or change in the amount of urine  unusual bruising or bleeding Side effects that usually do not require medical attention (report to your doctor or health care professional if they continue or are bothersome):  bone pain  muscle pain This list may not describe all possible side effects. Call your doctor for medical advice about side effects. You may report side effects to FDA at 1-800-FDA-1088. Where should I keep my medicine? Keep out of the reach of children. If you are using this medicine at home, you will be instructed on how to store it. Throw away any unused medicine after the expiration date on the label. NOTE: This sheet is a summary. It may not cover all possible information. If you have questions about this medicine, talk to your doctor, pharmacist, or health care provider.  2021 Elsevier/Gold Standard (2019-09-12 13:20:51)  

## 2020-10-01 ENCOUNTER — Other Ambulatory Visit: Payer: Self-pay | Admitting: *Deleted

## 2020-10-01 MED ORDER — FLUCONAZOLE 100 MG PO TABS: ORAL_TABLET | ORAL | 1 refills | Status: DC

## 2020-10-03 ENCOUNTER — Encounter: Payer: Self-pay | Admitting: Oncology

## 2020-10-06 ENCOUNTER — Encounter: Payer: Self-pay | Admitting: Oncology

## 2020-10-08 ENCOUNTER — Telehealth: Payer: Self-pay | Admitting: *Deleted

## 2020-10-08 ENCOUNTER — Other Ambulatory Visit: Payer: Self-pay

## 2020-10-08 ENCOUNTER — Other Ambulatory Visit: Payer: Self-pay | Admitting: *Deleted

## 2020-10-08 ENCOUNTER — Inpatient Hospital Stay: Payer: BC Managed Care – PPO

## 2020-10-08 ENCOUNTER — Inpatient Hospital Stay: Payer: BC Managed Care – PPO | Attending: Oncology

## 2020-10-08 DIAGNOSIS — Z23 Encounter for immunization: Secondary | ICD-10-CM | POA: Insufficient documentation

## 2020-10-08 DIAGNOSIS — C50411 Malignant neoplasm of upper-outer quadrant of right female breast: Secondary | ICD-10-CM

## 2020-10-08 DIAGNOSIS — D649 Anemia, unspecified: Secondary | ICD-10-CM

## 2020-10-08 DIAGNOSIS — Z5111 Encounter for antineoplastic chemotherapy: Secondary | ICD-10-CM | POA: Insufficient documentation

## 2020-10-08 DIAGNOSIS — Z170 Estrogen receptor positive status [ER+]: Secondary | ICD-10-CM | POA: Diagnosis not present

## 2020-10-08 LAB — CBC WITH DIFFERENTIAL (CANCER CENTER ONLY)
Abs Immature Granulocytes: 2.86 10*3/uL — ABNORMAL HIGH (ref 0.00–0.07)
Basophils Absolute: 0.1 10*3/uL (ref 0.0–0.1)
Basophils Relative: 0 %
Eosinophils Absolute: 0 10*3/uL (ref 0.0–0.5)
Eosinophils Relative: 0 %
HCT: 27.1 % — ABNORMAL LOW (ref 36.0–46.0)
Hemoglobin: 9.2 g/dL — ABNORMAL LOW (ref 12.0–15.0)
Immature Granulocytes: 20 %
Lymphocytes Relative: 7 %
Lymphs Abs: 1 10*3/uL (ref 0.7–4.0)
MCH: 33.2 pg (ref 26.0–34.0)
MCHC: 33.9 g/dL (ref 30.0–36.0)
MCV: 97.8 fL (ref 80.0–100.0)
Monocytes Absolute: 1.7 10*3/uL — ABNORMAL HIGH (ref 0.1–1.0)
Monocytes Relative: 12 %
Neutro Abs: 8.9 10*3/uL — ABNORMAL HIGH (ref 1.7–7.7)
Neutrophils Relative %: 61 %
Platelet Count: 232 10*3/uL (ref 150–400)
RBC: 2.77 MIL/uL — ABNORMAL LOW (ref 3.87–5.11)
RDW: 14.6 % (ref 11.5–15.5)
WBC Count: 14.5 10*3/uL — ABNORMAL HIGH (ref 4.0–10.5)
nRBC: 0.4 % — ABNORMAL HIGH (ref 0.0–0.2)

## 2020-10-08 LAB — SAMPLE TO BLOOD BANK

## 2020-10-08 NOTE — Progress Notes (Signed)
   Covid-19 Vaccination Clinic  Name:  Autumn Bullock    MRN: [id number] DOB: 12/16/1975  10/08/2020  [name] was observed post Covid-19 immunization for 15 minutes without incident. She was provided with Vaccine Information Sheet and instruction to access the V-Safe system.   [name] was instructed to call 911 with any severe reactions post vaccine: Marland Kitchen Difficulty breathing  . Swelling of face and throat  . A fast heartbeat  . A bad rash all over body  . Dizziness and weakness   Immunizations Administered    Name Date Dose VIS Date Route   PFIZER Comrnaty(Gray TOP) Covid-19 Vaccine 10/08/2020  9:48 AM 0.3 mL 08/12/2020 Intramuscular   Manufacturer: [employer]   Lot: [id number]   NDC: [id number]

## 2020-10-08 NOTE — Telephone Encounter (Signed)
This RN spoke with pt per her heme level this AM with noted continued decline .  Pt is very symptomatic with increased dizziness/lightheadedness and fatigue interfering with ADL's.  Per discussion- of lab and MD recommendation ( discussed with MD per pt concern on 10/06/2020) to proceed with 1 unit blood if symptoms persist.  Blood type drawn x 2 due to first blood transfusion.  Orders entered and verified as received by BB.  Pt understand to not remove blood armband for transfusion tomorrow at 830 am.

## 2020-10-09 ENCOUNTER — Other Ambulatory Visit: Payer: Self-pay

## 2020-10-09 ENCOUNTER — Inpatient Hospital Stay: Payer: BC Managed Care – PPO

## 2020-10-09 DIAGNOSIS — Z5111 Encounter for antineoplastic chemotherapy: Secondary | ICD-10-CM | POA: Diagnosis not present

## 2020-10-09 LAB — PREPARE RBC (CROSSMATCH)

## 2020-10-09 LAB — ABO/RH: ABO/RH(D): B POS

## 2020-10-09 MED ORDER — DIPHENHYDRAMINE HCL 25 MG PO CAPS
25.0000 mg | ORAL_CAPSULE | Freq: Once | ORAL | Status: AC
  Administered 2020-10-09: 25 mg via ORAL

## 2020-10-09 MED ORDER — ACETAMINOPHEN 325 MG PO TABS
650.0000 mg | ORAL_TABLET | Freq: Once | ORAL | Status: AC
  Administered 2020-10-09: 650 mg via ORAL

## 2020-10-09 MED ORDER — SODIUM CHLORIDE 0.9% FLUSH
10.0000 mL | INTRAVENOUS | Status: AC | PRN
  Administered 2020-10-09: 10 mL
  Filled 2020-10-09: qty 10

## 2020-10-09 MED ORDER — HEPARIN SOD (PORK) LOCK FLUSH 100 UNIT/ML IV SOLN
250.0000 [IU] | INTRAVENOUS | Status: AC | PRN
  Administered 2020-10-09: 500 [IU]
  Filled 2020-10-09: qty 5

## 2020-10-09 MED ORDER — SODIUM CHLORIDE 0.9% IV SOLUTION
250.0000 mL | Freq: Once | INTRAVENOUS | Status: AC
  Administered 2020-10-09: 250 mL via INTRAVENOUS
  Filled 2020-10-09: qty 250

## 2020-10-09 NOTE — Progress Notes (Signed)
Per Dr Alvy Bimler, [place] to proceed w/ blood transfusion w/ temp of 101.

## 2020-10-09 NOTE — Progress Notes (Signed)
Patient tolerated transfusion well. Denies any s/s at this time. Educated patient on s/s as to when to seek emergency care and to monitor temperature at home and reach out to on call services should she continue with a fever. Patient verbalizes understanding and denies any further questions or concerns. Discharged home.

## 2020-10-10 LAB — BPAM RBC
Blood Product Expiration Date: 202203012359
ISSUE DATE / TIME: 202202050954
Unit Type and Rh: 7300

## 2020-10-10 LAB — TYPE AND SCREEN
ABO/RH(D): B POS
Antibody Screen: NEGATIVE
Unit division: 0

## 2020-10-12 ENCOUNTER — Ambulatory Visit: Payer: BC Managed Care – PPO

## 2020-10-12 ENCOUNTER — Other Ambulatory Visit: Payer: BC Managed Care – PPO

## 2020-10-15 ENCOUNTER — Encounter: Payer: Self-pay | Admitting: Adult Health

## 2020-10-15 ENCOUNTER — Inpatient Hospital Stay: Payer: BC Managed Care – PPO

## 2020-10-15 ENCOUNTER — Other Ambulatory Visit: Payer: Self-pay

## 2020-10-15 ENCOUNTER — Other Ambulatory Visit: Payer: BC Managed Care – PPO

## 2020-10-15 ENCOUNTER — Inpatient Hospital Stay: Payer: BC Managed Care – PPO | Admitting: Adult Health

## 2020-10-15 VITALS — BP 103/66 | HR 66 | Resp 18

## 2020-10-15 VITALS — BP 118/64 | HR 78 | Temp 97.6°F | Resp 18 | Ht 65.0 in | Wt 161.1 lb

## 2020-10-15 DIAGNOSIS — Z95828 Presence of other vascular implants and grafts: Secondary | ICD-10-CM

## 2020-10-15 DIAGNOSIS — Z170 Estrogen receptor positive status [ER+]: Secondary | ICD-10-CM

## 2020-10-15 DIAGNOSIS — C50411 Malignant neoplasm of upper-outer quadrant of right female breast: Secondary | ICD-10-CM

## 2020-10-15 DIAGNOSIS — Z5111 Encounter for antineoplastic chemotherapy: Secondary | ICD-10-CM | POA: Diagnosis not present

## 2020-10-15 HISTORY — DX: Presence of other vascular implants and grafts: Z95.828

## 2020-10-15 LAB — COMPREHENSIVE METABOLIC PANEL
ALT: 26 U/L (ref 0–44)
AST: 20 U/L (ref 15–41)
Albumin: 4.4 g/dL (ref 3.5–5.0)
Alkaline Phosphatase: 92 U/L (ref 38–126)
Anion gap: 9 (ref 5–15)
BUN: 11 mg/dL (ref 6–20)
CO2: 24 mmol/L (ref 22–32)
Calcium: 9 mg/dL (ref 8.9–10.3)
Chloride: 106 mmol/L (ref 98–111)
Creatinine, Ser: 0.74 mg/dL (ref 0.44–1.00)
GFR, Estimated: >60 mL/min (ref >60)
Glucose, Bld: 112 mg/dL — ABNORMAL HIGH (ref 70–99)
Potassium: 4 mmol/L (ref 3.5–5.1)
Sodium: 139 mmol/L (ref 135–145)
Total Bilirubin: 0.4 mg/dL (ref 0.3–1.2)
Total Protein: 7.2 g/dL (ref 6.5–8.1)

## 2020-10-15 LAB — CBC WITH DIFFERENTIAL/PLATELET
Abs Immature Granulocytes: 0.2 10*3/uL — ABNORMAL HIGH (ref 0.00–0.07)
Basophils Absolute: 0.1 10*3/uL (ref 0.0–0.1)
Basophils Relative: 1 %
Eosinophils Absolute: 0 10*3/uL (ref 0.0–0.5)
Eosinophils Relative: 0 %
HCT: 32.6 % — ABNORMAL LOW (ref 36.0–46.0)
Hemoglobin: 11.1 g/dL — ABNORMAL LOW (ref 12.0–15.0)
Immature Granulocytes: 3 %
Lymphocytes Relative: 22 %
Lymphs Abs: 1.5 10*3/uL (ref 0.7–4.0)
MCH: 32.3 pg (ref 26.0–34.0)
MCHC: 34 g/dL (ref 30.0–36.0)
MCV: 94.8 fL (ref 80.0–100.0)
Monocytes Absolute: 0.7 10*3/uL (ref 0.1–1.0)
Monocytes Relative: 10 %
Neutro Abs: 4.4 10*3/uL (ref 1.7–7.7)
Neutrophils Relative %: 64 %
Platelets: 251 10*3/uL (ref 150–400)
RBC: 3.44 MIL/uL — ABNORMAL LOW (ref 3.87–5.11)
RDW: 17 % — ABNORMAL HIGH (ref 11.5–15.5)
WBC: 6.8 10*3/uL (ref 4.0–10.5)
nRBC: 0 % (ref 0.0–0.2)

## 2020-10-15 LAB — PREGNANCY, URINE: Preg Test, Ur: NEGATIVE

## 2020-10-15 MED ORDER — FAMOTIDINE IN NACL 20-0.9 MG/50ML-% IV SOLN
INTRAVENOUS | Status: AC
  Filled 2020-10-15: qty 50

## 2020-10-15 MED ORDER — DIPHENHYDRAMINE HCL 50 MG/ML IJ SOLN
INTRAMUSCULAR | Status: AC
  Filled 2020-10-15: qty 1

## 2020-10-15 MED ORDER — SODIUM CHLORIDE 0.9% FLUSH
10.0000 mL | Freq: Once | INTRAVENOUS | Status: AC
  Administered 2020-10-15: 10 mL
  Filled 2020-10-15: qty 10

## 2020-10-15 MED ORDER — FAMOTIDINE IN NACL 20-0.9 MG/50ML-% IV SOLN
20.0000 mg | Freq: Once | INTRAVENOUS | Status: AC
  Administered 2020-10-15: 20 mg via INTRAVENOUS

## 2020-10-15 MED ORDER — DIPHENHYDRAMINE HCL 50 MG/ML IJ SOLN
50.0000 mg | Freq: Once | INTRAMUSCULAR | Status: AC
  Administered 2020-10-15: 50 mg via INTRAVENOUS

## 2020-10-15 MED ORDER — LORAZEPAM 2 MG/ML IJ SOLN
0.5000 mg | Freq: Once | INTRAMUSCULAR | Status: AC
  Administered 2020-10-15: 0.5 mg via INTRAVENOUS

## 2020-10-15 MED ORDER — SODIUM CHLORIDE 0.9 % IV SOLN
80.0000 mg/m2 | Freq: Once | INTRAVENOUS | Status: AC
  Administered 2020-10-15: 144 mg via INTRAVENOUS
  Filled 2020-10-15: qty 24

## 2020-10-15 MED ORDER — HEPARIN SOD (PORK) LOCK FLUSH 100 UNIT/ML IV SOLN
500.0000 [IU] | Freq: Once | INTRAVENOUS | Status: AC | PRN
  Administered 2020-10-15: 500 [IU]
  Filled 2020-10-15: qty 5

## 2020-10-15 MED ORDER — SODIUM CHLORIDE 0.9 % IV SOLN
20.0000 mg | Freq: Once | INTRAVENOUS | Status: AC
  Administered 2020-10-15: 20 mg via INTRAVENOUS
  Filled 2020-10-15: qty 20

## 2020-10-15 MED ORDER — SODIUM CHLORIDE 0.9 % IV SOLN
Freq: Once | INTRAVENOUS | Status: AC
  Filled 2020-10-15: qty 250

## 2020-10-15 MED ORDER — LORAZEPAM 2 MG/ML IJ SOLN
INTRAMUSCULAR | Status: AC
  Filled 2020-10-15: qty 1

## 2020-10-15 MED ORDER — SODIUM CHLORIDE 0.9% FLUSH
10.0000 mL | INTRAVENOUS | Status: DC | PRN
  Administered 2020-10-15: 10 mL
  Filled 2020-10-15: qty 10

## 2020-10-15 NOTE — Progress Notes (Signed)
Per Dr. Jana Hakim - patient is to receive ativan 0.5 mg IV as a pre-medication for future encounters. Patient received 0.5 mg of ativan IV today with pre-medications for nausea. Pharmacy made aware of updated treatment plan.

## 2020-10-15 NOTE — Patient Instructions (Signed)
[facility] Discharge Instructions for Patients Receiving Chemotherapy  Today you received the following chemotherapy agents: Paclitaxel (Taxol)  To help prevent nausea and vomiting after your treatment, we encourage you to take your nausea medication  as prescribed.    If you develop nausea and vomiting that is not controlled by your nausea medication, call the clinic.   BELOW ARE SYMPTOMS THAT SHOULD BE REPORTED IMMEDIATELY:  *FEVER GREATER THAN 100.5 F  *CHILLS WITH OR WITHOUT FEVER  NAUSEA AND VOMITING THAT IS NOT CONTROLLED WITH YOUR NAUSEA MEDICATION  *UNUSUAL SHORTNESS OF BREATH  *UNUSUAL BRUISING OR BLEEDING  TENDERNESS IN MOUTH AND THROAT WITH OR WITHOUT PRESENCE OF ULCERS  *URINARY PROBLEMS  *BOWEL PROBLEMS  UNUSUAL RASH Items with * indicate a potential emergency and should be followed up as soon as possible.  Feel free to call the clinic should you have any questions or concerns. The clinic phone number is [phone].  Please show the [facility] at check-in to the Emergency Department and triage nurse.  Paclitaxel injection What is this medicine? PACLITAXEL (PAK li TAX el) is a chemotherapy drug. It targets fast dividing cells, like cancer cells, and causes these cells to die. This medicine is used to treat ovarian cancer, breast cancer, lung cancer, Kaposi's sarcoma, and other cancers. This medicine may be used for other purposes; ask your health care provider or pharmacist if you have questions. COMMON BRAND NAME(S): Onxol, Taxol What should I tell my health care provider before I take this medicine? They need to know if you have any of these conditions:  history of irregular heartbeat  liver disease  low blood counts, like low white cell, platelet, or red cell counts  lung or breathing disease, like asthma  tingling of the fingers or toes, or other nerve disorder  an unusual or allergic reaction to paclitaxel, alcohol,  polyoxyethylated castor oil, other chemotherapy, other medicines, foods, dyes, or preservatives  pregnant or trying to get pregnant  breast-feeding How should I use this medicine? This drug is given as an infusion into a vein. It is administered in a hospital or clinic by a specially trained health care professional. Talk to your pediatrician regarding the use of this medicine in children. Special care may be needed. Overdosage: If you think you have taken too much of this medicine contact a poison control center or emergency room at once. NOTE: This medicine is only for you. Do not share this medicine with others. What if I miss a dose? It is important not to miss your dose. Call your doctor or health care professional if you are unable to keep an appointment. What may interact with this medicine? Do not take this medicine with any of the following medications:  live virus vaccines This medicine may also interact with the following medications:  antiviral medicines for hepatitis, HIV or AIDS  certain antibiotics like erythromycin and clarithromycin  certain medicines for fungal infections like ketoconazole and itraconazole  certain medicines for seizures like carbamazepine, phenobarbital, phenytoin  gemfibrozil  nefazodone  rifampin  St. John's wort This list may not describe all possible interactions. Give your health care provider a list of all the medicines, herbs, non-prescription drugs, or dietary supplements you use. Also tell them if you smoke, drink alcohol, or use illegal drugs. Some items may interact with your medicine. What should I watch for while using this medicine? Your condition will be monitored carefully while you are receiving this medicine. You will need  important blood work done while you are taking this medicine. This medicine can cause serious allergic reactions. To reduce your risk you will need to take other medicine(s) before treatment with this  medicine. If you experience allergic reactions like skin rash, itching or hives, swelling of the face, lips, or tongue, tell your doctor or health care professional right away. In some cases, you may be given additional medicines to help with side effects. Follow all directions for their use. This drug may make you feel generally unwell. This is not uncommon, as chemotherapy can affect healthy cells as well as cancer cells. Report any side effects. Continue your course of treatment even though you feel ill unless your doctor tells you to stop. Call your doctor or health care professional for advice if you get a fever, chills or sore throat, or other symptoms of a cold or flu. Do not treat yourself. This drug decreases your body's ability to fight infections. Try to avoid being around people who are sick. This medicine may increase your risk to bruise or bleed. Call your doctor or health care professional if you notice any unusual bleeding. Be careful brushing and flossing your teeth or using a toothpick because you may get an infection or bleed more easily. If you have any dental work done, tell your dentist you are receiving this medicine. Avoid taking products that contain aspirin, acetaminophen, ibuprofen, naproxen, or ketoprofen unless instructed by your doctor. These medicines may hide a fever. Do not become pregnant while taking this medicine. Women should inform their doctor if they wish to become pregnant or think they might be pregnant. There is a potential for serious side effects to an unborn child. Talk to your health care professional or pharmacist for more information. Do not breast-feed an infant while taking this medicine. Men are advised not to father a child while receiving this medicine. This product may contain alcohol. Ask your pharmacist or healthcare provider if this medicine contains alcohol. Be sure to tell all healthcare providers you are taking this medicine. Certain medicines,  like metronidazole and disulfiram, can cause an unpleasant reaction when taken with alcohol. The reaction includes flushing, headache, nausea, vomiting, sweating, and increased thirst. The reaction can last from 30 minutes to several hours. What side effects may I notice from receiving this medicine? Side effects that you should report to your doctor or health care professional as soon as possible:  allergic reactions like skin rash, itching or hives, swelling of the face, lips, or tongue  breathing problems  changes in vision  fast, irregular heartbeat  high or low blood pressure  mouth sores  pain, tingling, numbness in the hands or feet  signs of decreased platelets or bleeding - bruising, pinpoint red spots on the skin, black, tarry stools, blood in the urine  signs of decreased red blood cells - unusually weak or tired, feeling faint or lightheaded, falls  signs of infection - fever or chills, cough, sore throat, pain or difficulty passing urine  signs and symptoms of liver injury like dark yellow or brown urine; general ill feeling or flu-like symptoms; light-colored stools; loss of appetite; nausea; right upper belly pain; unusually weak or tired; yellowing of the eyes or skin  swelling of the ankles, feet, hands  unusually slow heartbeat Side effects that usually do not require medical attention (report to your doctor or health care professional if they continue or are bothersome):  diarrhea  hair loss  loss of appetite  muscle or  joint pain  nausea, vomiting  pain, redness, or irritation at site where injected  tiredness This list may not describe all possible side effects. Call your doctor for medical advice about side effects. You may report side effects to FDA at 1-800-FDA-1088. Where should I keep my medicine? This drug is given in a hospital or clinic and will not be stored at home. NOTE: This sheet is a summary. It may not cover all possible information.  If you have questions about this medicine, talk to your doctor, pharmacist, or health care provider.  2021 Elsevier/Gold Standard (2019-07-23 13:37:23)

## 2020-10-15 NOTE — Progress Notes (Signed)
Entered Ativan 0.5mg  IV with future treatments per MD request.  Acquanetta Belling, RPH, BCPS, BCOP  10/15/2020 11:11 AM

## 2020-10-15 NOTE — Progress Notes (Signed)
[facility]  [phone] [phone]     ID: Autumn Bullock DOB: 09-23-1975  MR#: [id number]  NID#:[id number]  Patient Care Team: Brock Ra, PA-C as PCP - General Mauro Kaufmann, RN as Oncology Nurse Navigator Rockwell Germany, RN as Oncology Nurse Navigator Donnie Mesa, MD as Consulting Physician (General Surgery) Eppie Gibson, MD as Attending Physician (Radiation Oncology) Magrinat, Virgie Dad, MD as Consulting Physician (Oncology) Vanessa Kick, MD as Consulting Physician (Obstetrics and Gynecology) Lovett Calender, MD as Consulting Physician (Orthopedic Surgery) Scot Dock, NP OTHER MD:  CHIEF COMPLAINT: Estrogen receptor positive breast cancer  CURRENT TREATMENT: Adjuvant chemotherapy   INTERVAL HISTORY: Autumn Bullock returns Bullock for follow up and treatment of her estrogen receptor positive breast cancer. She Autumn unaccompanied Bullock.    Autumn Bullock to receive her first dose of weekly Paclitaxel.  She notes she was so incredibly tired after receiving the four doxorubicin and cyclophosphamide treatments, she Autumn hoping this treatment Autumn better.    REVIEW OF SYSTEMS: Autumn Bullock from her most recent chemo regimen, and received blood last week for her anemia.  She Autumn feeling better Bullock, however notes that she did not get the burst in energy that she expected. She denies any other issues such as fever, chills, chest pain, palpitations, cough, bowel/bladder changes, headaches, vision issues or other concerns.  A detailed ROS Was otherwise non contributory.      COVID 19 VACCINATION STATUS: Status post Pfizer x2, most recent treatment April 2021.  Patient also was diagnosed with COVID-19 disease November 2020   HISTORY OF CURRENT ILLNESS: From the original intake note:  Autumn Bullock had routine screening mammography on 06/02/2020 showing a possible abnormality in the right breast. She underwent right diagnostic mammography  with tomography and right breast ultrasonography at [facility] on 06/08/2020 showing: breast density category C; 2.3 cm right breast mass at 11:30; 0.8 cm mass superficial to dominant mass; no right axillary adenopathy.  Accordingly on 06/08/2020 she proceeded to biopsy of the right breast area in question. The pathology from this procedure ([id number]) showed: invasive and in situ mammary carcinoma, grade 2, e-cadherin positive. Both biopsied masses showed this and were found to be morphologically similar. Prognostic indicators significant for: estrogen receptor, 90% positive and progesterone receptor, 70% positive, both with strong staining intensity. Proliferation marker Ki67 at 5%. HER2 negative by immunohistochemistry (1+).  The patient's subsequent history Autumn as detailed below.   PAST MEDICAL HISTORY: Past Medical History:  Diagnosis Date  . Family history of uterine cancer 06/16/2020  . PONV (postoperative nausea and vomiting)     PAST SURGICAL HISTORY: Past Surgical History:  Procedure Laterality Date  . BREAST LUMPECTOMY WITH RADIOACTIVE SEED AND SENTINEL LYMPH NODE BIOPSY Right 07/14/2020   Procedure: RIGHT BREAST LUMPECTOMY WITH BRACKETED RADIOACTIVE SEED AND SENTINEL LYMPH NODE BIOPSY, BLUE DYE INJECTION;  Surgeon: Donnie Mesa, MD;  Location: [facility];  Service: General;  Laterality: Right;  PEC BLOCK, BLUE DYE INJECTION  . CESAREAN SECTION     2011  . PORTACATH PLACEMENT Right 07/14/2020   Procedure: INSERTION PORT-A-CATH WITH ULTRASOUND GUIDANCE;  Surgeon: Donnie Mesa, MD;  Location: [facility];  Service: General;  Laterality: Right;  . RE-EXCISION OF BREAST LUMPECTOMY Right 08/03/2020   Procedure: RE-EXCISION OF RIGHT BREAST LUMPECTOMY SUPERIOR MARGINS;  Surgeon: Donnie Mesa, MD;  Location: [facility];  Service: General;  Laterality: Right;  LMA    FAMILY HISTORY: Family History  Problem Relation Age of Onset  .  Cancer Maternal Grandmother 24       Unknown GYN.  ?Uterine  As of October 2021 her parents are both living, her father at age 84 and her mother at 69, . Autumn Bullock has 1 brother and 2 sisters. She reports uterine cancer in her maternal grandmother at age 3. There Autumn no family history of breast, ovarian, or colon cancer to her knowledge.   GYNECOLOGIC HISTORY:  No LMP recorded. Menarche: 45 years old Age at first live birth: 45 years old [place] P 1 LMP 05/05/2020, regular monthly periods, lasting 3 days with 1-2 heavy days Contraceptive: has used for 15 years, no complications HRT n/a  Hysterectomy? no BSO? 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 Husband Autumn Bullock works in Nature conservation officer. She lives at home with Autumn Bullock and their daughter Autumn Bullock, age 61. She attends [employer] in [place].    ADVANCED DIRECTIVES: In the absence of any documentation to the contrary, the patient's spouse Autumn their HCPOA.    HEALTH MAINTENANCE: Social History   Tobacco Use  . Smoking status: Never Smoker  . Smokeless tobacco: Never Used  Substance Use Topics  . Alcohol use: Yes    Comment: 7/week  . Drug use: Never     Colonoscopy: 1999?  PAP: 05/31/2020  Bone density: never done   Allergies  Allergen Reactions  . Wound Dressing Adhesive     Tape etc     Current Outpatient Medications  Medication Sig Dispense Refill  . dexamethasone (DECADRON) 4 MG tablet Take 2 tablets daily for 3 days after chemo. Take with food. 30 tablet 1  . docusate sodium (COLACE) 100 MG capsule Take 2 capsules (200 mg total) by mouth 2 (two) times daily. 80 capsule 0  . doxycycline (VIBRA-TABS) 100 MG tablet Take 1 tablet (100 mg total) by mouth daily. 60 tablet 1  . fluconazole (DIFLUCAN) 100 MG tablet Take daily for 3 days then as needed if rash recurs 10 tablet 1  . ketoconazole (NIZORAL) 2 % cream Apply 1 application topically daily. 15 g 0  .  lidocaine-prilocaine (EMLA) cream Apply to affected area once 30 g 3  . LORazepam (ATIVAN) 0.5 MG tablet Take 1 tablet (0.5 mg total) by mouth at bedtime as needed (Nausea or vomiting). 30 tablet 0  . metroNIDAZOLE (METROGEL) 1 % gel Apply topically daily. 45 g 0  . omeprazole (PRILOSEC) 40 MG capsule Take 1 capsule (40 mg total) by mouth at bedtime. 90 capsule 4  . ondansetron (ZOFRAN ODT) 4 MG disintegrating tablet Take 1 tablet (4 mg total) by mouth every 8 (eight) hours as needed for nausea or vomiting. 20 tablet 2  . prochlorperazine (COMPAZINE) 10 MG tablet Take 1 tablet (10 mg total) by mouth every 6 (six) hours as needed (Nausea or vomiting). 30 tablet 1   No current facility-administered medications for this visit.    OBJECTIVE: White woman in no acute distress  Vitals:   10/15/20 0917  BP: 118/64  Pulse: 78  Resp: 18  Temp: 97.6 F (36.4 C)  SpO2: 100%     Body mass index Autumn 26.81 kg/m.   Wt Readings from Last 3 Encounters:  10/15/20 161 lb 1.6 oz (73.1 kg)  09/28/20 161 lb 6.4 oz (73.2 kg)  09/14/20 163 lb 1.6 oz (74 kg)      ECOG FS:1 - Symptomatic but completely ambulatory GENERAL: Patient Autumn  a well appearing female in no acute distress HEENT:  Sclerae anicteric.  Oropharynx clear and moist. No ulcerations or evidence of oropharyngeal candidiasis. Neck Autumn supple.  NODES:  No cervical, supraclavicular, or axillary lymphadenopathy palpated.  BREAST EXAM:  Deferred. LUNGS:  Clear to auscultation bilaterally.  No wheezes or rhonchi. HEART:  Regular rate and rhythm. No murmur appreciated. ABDOMEN:  Soft, nontender.  Positive, normoactive bowel sounds. No organomegaly palpated. MSK:  No focal spinal tenderness to palpation. Full range of motion bilaterally in the upper extremities. EXTREMITIES:  No peripheral edema.   SKIN:  Clear with no obvious rashes or skin changes. No nail dyscrasia. NEURO:  Nonfocal. Well oriented.  Appropriate affect.     LAB  RESULTS:  CMP     Component Value Date/Time   NA 139 10/15/2020 0909   K 4.0 10/15/2020 0909   CL 106 10/15/2020 0909   CO2 24 10/15/2020 0909   GLUCOSE 112 (H) 10/15/2020 0909   BUN 11 10/15/2020 0909   CREATININE 0.74 10/15/2020 0909   CREATININE 0.73 08/17/2020 0905   CALCIUM 9.0 10/15/2020 0909   PROT 7.2 10/15/2020 0909   ALBUMIN 4.4 10/15/2020 0909   AST 20 10/15/2020 0909   AST 18 08/17/2020 0905   ALT 26 10/15/2020 0909   ALT 18 08/17/2020 0905   ALKPHOS 92 10/15/2020 0909   BILITOT 0.4 10/15/2020 0909   BILITOT 0.7 08/17/2020 0905   GFRNONAA >60 10/15/2020 0909   GFRNONAA >60 08/17/2020 0905    No results found for: TOTALPROTELP, ALBUMINELP, A1GS, A2GS, BETS, BETA2SER, GAMS, MSPIKE, SPEI  Lab Results  Component Value Date   WBC 6.8 10/15/2020   NEUTROABS 4.4 10/15/2020   HGB 11.1 (L) 10/15/2020   HCT 32.6 (L) 10/15/2020   MCV 94.8 10/15/2020   PLT 251 10/15/2020    No results found for: LABCA2  No components found for: UXLKGM010  No results for input(s): INR in the last 168 hours.  No results found for: LABCA2  No results found for: UVO536  No results found for: UYQ034  No results found for: VQQ595  No results found for: CA2729  No components found for: HGQUANT  No results found for: CEA1 / No results found for: CEA1   No results found for: AFPTUMOR  No results found for: CHROMOGRNA  No results found for: KPAFRELGTCHN, LAMBDASER, KAPLAMBRATIO (kappa/lambda light chains)  No results found for: HGBA, HGBA2QUANT, HGBFQUANT, HGBSQUAN (Hemoglobinopathy evaluation)   No results found for: LDH  No results found for: IRON, TIBC, IRONPCTSAT (Iron and TIBC)  No results found for: FERRITIN  Urinalysis    Component Value Date/Time   COLORURINE YELLOW 09/02/2020 0843   APPEARANCEUR HAZY (A) 09/02/2020 0843   LABSPEC 1.010 09/02/2020 0843   PHURINE 6.0 09/02/2020 0843   GLUCOSEU NEGATIVE 09/02/2020 0843   HGBUR SMALL (A) 09/02/2020 0843    BILIRUBINUR NEGATIVE 09/02/2020 0843   KETONESUR NEGATIVE 09/02/2020 0843   PROTEINUR NEGATIVE 09/02/2020 0843   NITRITE NEGATIVE 09/02/2020 0843   LEUKOCYTESUR TRACE (A) 09/02/2020 0843    STUDIES: No results found.   ELIGIBLE FOR AVAILABLE RESEARCH PROTOCOL: AET  ASSESSMENT: 45 y.o. [place] woman status post right breast upper outer quadrant biopsy 06/08/2020 for a clinical mT2 N0, stage IB invasive ductal carcinoma, grade 2, estrogen and progesterone receptor positive, HER-2 not amplified, with an MIB-1 of 5%.  (1) genetics testing 06/22/2020 through the Common Hereditary Cancers Panel offered by [facility] found no deleterious mutations in APC, ATM, AXIN2, BARD1,  BMPR1A, BRCA1, BRCA2, BRIP1, CDH1, CDK4, CDKN2A (p14ARF), CDKN2A (p16INK4a), CHEK2, CTNNA1, DICER1, EPCAM (Deletion/duplication testing only), GREM1 (promoter region deletion/duplication testing only), KIT, MEN1, MLH1, MSH2, MSH3, MSH6, MUTYH, NBN, NF1, NHTL1, PALB2, PDGFRA, PMS2, POLD1, POLE, PTEN, RAD50, RAD51C, RAD51D, RNF43, SDHB, SDHC, SDHD, SMAD4, SMARCA4. STK11, TP53, TSC1, TSC2, and VHL.  The following genes were evaluated for sequence changes only: SDHA and HOXB13 c.251G>A variant only.  (a)  a variant of uncertain significance in MSH3 at c.2731T>G (p.Leu911Val).   (2) right lumpectomy and sentinel lymph node sampling 07/14/2020 showed a pT2 pN1(mic), stage IIA invasive ductal carcinoma, grade 2, with a positive superior margin  (a) additional surgery 08/03/2020  (3) MammaPrint obtained from the initial biopsy showed high risk, predicting a 93% 5-year disease-free survival with chemotherapy and significant benefit from chemotherapy (greater than 12%).  (4) cyclophosphamide and doxorubicin in dose dense fashion x4 to start 08/03/2020, to be followed by paclitaxel weekly x12  (a) echo 07/09/2020 shows an ejection fraction in the 60-65% range.  (5) adjuvant radiation to follow  (6) antiestrogens to start at the  completion of local treatment   PLAN: Autumn Bullock Autumn ready to begin her weekly Paclitaxel treatments.  She understands potential side effects and Autumn ready to proceed.  She understands cryotherapy and Autumn going to do this.  We reviewed her anti nausea medications in detail.    I told Autumn Bullock that the weekly Paclitaxel Autumn more gentle than the previous chemotherapy.  I reassured her that most people start to feel better after this treatment.  She understands this and Autumn hopeful that she will continue to regain her strength.  I wrote a letter at [facility] request for her daughter to be able to go to middle school with her peers rather than changing schools.  That would be beneficial for her daughter Autumn Bullock, especially as she deals with her mother's diagnosis and treatment.    Autumn Bullock will return next week for labs, f/u, and her next treatment.  She knows to call for any questions that may arise between now and her next appointment.  We are happy to see her sooner if needed.  Total encounter time 30 minutes.Wilber Bihari, NP 10/15/20 10:01 AM Medical Oncology and Hematology [facility] [place], [place] [address] Tel. [phone]    Fax. [phone]   *Total Encounter Time as defined by the Centers for Medicare and Medicaid Services includes, in addition to the face-to-face time of a patient visit (documented in the note above) non-face-to-face time: obtaining and reviewing outside history, ordering and reviewing medications, tests or procedures, care coordination (communications with other health care professionals or caregivers) and documentation in the medical record.

## 2020-10-18 ENCOUNTER — Telehealth: Payer: Self-pay | Admitting: Adult Health

## 2020-10-18 ENCOUNTER — Telehealth: Payer: Self-pay | Admitting: *Deleted

## 2020-10-18 NOTE — Telephone Encounter (Signed)
No 2/11 los. No changes made to pt's schedule.

## 2020-10-19 ENCOUNTER — Other Ambulatory Visit: Payer: BC Managed Care – PPO

## 2020-10-19 ENCOUNTER — Ambulatory Visit: Payer: BC Managed Care – PPO

## 2020-10-19 ENCOUNTER — Encounter: Payer: Self-pay | Admitting: *Deleted

## 2020-10-22 ENCOUNTER — Encounter: Payer: Self-pay | Admitting: *Deleted

## 2020-10-22 ENCOUNTER — Encounter: Payer: Self-pay | Admitting: Adult Health

## 2020-10-22 ENCOUNTER — Inpatient Hospital Stay: Payer: BC Managed Care – PPO

## 2020-10-22 ENCOUNTER — Other Ambulatory Visit: Payer: BC Managed Care – PPO

## 2020-10-22 ENCOUNTER — Inpatient Hospital Stay: Payer: BC Managed Care – PPO | Admitting: Adult Health

## 2020-10-22 ENCOUNTER — Encounter: Payer: Self-pay | Admitting: Oncology

## 2020-10-22 ENCOUNTER — Other Ambulatory Visit: Payer: Self-pay

## 2020-10-22 VITALS — BP 127/79 | HR 69 | Temp 97.5°F | Resp 17 | Ht 65.0 in | Wt 162.3 lb

## 2020-10-22 DIAGNOSIS — C50411 Malignant neoplasm of upper-outer quadrant of right female breast: Secondary | ICD-10-CM | POA: Diagnosis not present

## 2020-10-22 DIAGNOSIS — Z170 Estrogen receptor positive status [ER+]: Secondary | ICD-10-CM

## 2020-10-22 DIAGNOSIS — Z5111 Encounter for antineoplastic chemotherapy: Secondary | ICD-10-CM | POA: Diagnosis not present

## 2020-10-22 DIAGNOSIS — Z95828 Presence of other vascular implants and grafts: Secondary | ICD-10-CM

## 2020-10-22 LAB — COMPREHENSIVE METABOLIC PANEL
ALT: 52 U/L — ABNORMAL HIGH (ref 0–44)
AST: 38 U/L (ref 15–41)
Albumin: 4.3 g/dL (ref 3.5–5.0)
Alkaline Phosphatase: 67 U/L (ref 38–126)
Anion gap: 8 (ref 5–15)
BUN: 14 mg/dL (ref 6–20)
CO2: 24 mmol/L (ref 22–32)
Calcium: 8.9 mg/dL (ref 8.9–10.3)
Chloride: 107 mmol/L (ref 98–111)
Creatinine, Ser: 0.75 mg/dL (ref 0.44–1.00)
GFR, Estimated: >60 mL/min (ref >60)
Glucose, Bld: 113 mg/dL — ABNORMAL HIGH (ref 70–99)
Potassium: 3.8 mmol/L (ref 3.5–5.1)
Sodium: 139 mmol/L (ref 135–145)
Total Bilirubin: 0.4 mg/dL (ref 0.3–1.2)
Total Protein: 7.1 g/dL (ref 6.5–8.1)

## 2020-10-22 LAB — CBC WITH DIFFERENTIAL/PLATELET
Abs Immature Granulocytes: 0.03 10*3/uL (ref 0.00–0.07)
Basophils Absolute: 0 10*3/uL (ref 0.0–0.1)
Basophils Relative: 1 %
Eosinophils Absolute: 0 10*3/uL (ref 0.0–0.5)
Eosinophils Relative: 1 %
HCT: 28.9 % — ABNORMAL LOW (ref 36.0–46.0)
Hemoglobin: 10.1 g/dL — ABNORMAL LOW (ref 12.0–15.0)
Immature Granulocytes: 1 %
Lymphocytes Relative: 26 %
Lymphs Abs: 0.9 10*3/uL (ref 0.7–4.0)
MCH: 32.9 pg (ref 26.0–34.0)
MCHC: 34.9 g/dL (ref 30.0–36.0)
MCV: 94.1 fL (ref 80.0–100.0)
Monocytes Absolute: 0.5 10*3/uL (ref 0.1–1.0)
Monocytes Relative: 15 %
Neutro Abs: 2 10*3/uL (ref 1.7–7.7)
Neutrophils Relative %: 56 %
Platelets: 437 10*3/uL — ABNORMAL HIGH (ref 150–400)
RBC: 3.07 MIL/uL — ABNORMAL LOW (ref 3.87–5.11)
RDW: 16.6 % — ABNORMAL HIGH (ref 11.5–15.5)
WBC: 3.6 10*3/uL — ABNORMAL LOW (ref 4.0–10.5)
nRBC: 0 % (ref 0.0–0.2)

## 2020-10-22 LAB — PREGNANCY, URINE: Preg Test, Ur: NEGATIVE

## 2020-10-22 MED ORDER — FAMOTIDINE IN NACL 20-0.9 MG/50ML-% IV SOLN
INTRAVENOUS | Status: AC
  Filled 2020-10-22: qty 50

## 2020-10-22 MED ORDER — SODIUM CHLORIDE 0.9 % IV SOLN
Freq: Once | INTRAVENOUS | Status: AC
  Filled 2020-10-22: qty 250

## 2020-10-22 MED ORDER — SODIUM CHLORIDE 0.9 % IV SOLN
80.0000 mg/m2 | Freq: Once | INTRAVENOUS | Status: AC
  Administered 2020-10-22: 144 mg via INTRAVENOUS
  Filled 2020-10-22: qty 24

## 2020-10-22 MED ORDER — SODIUM CHLORIDE 0.9% FLUSH
10.0000 mL | Freq: Once | INTRAVENOUS | Status: AC
  Administered 2020-10-22: 10 mL
  Filled 2020-10-22: qty 10

## 2020-10-22 MED ORDER — DIPHENHYDRAMINE HCL 50 MG/ML IJ SOLN
12.5000 mg | Freq: Once | INTRAMUSCULAR | Status: AC
  Administered 2020-10-22: 12.5 mg via INTRAVENOUS

## 2020-10-22 MED ORDER — DIPHENHYDRAMINE HCL 50 MG/ML IJ SOLN
INTRAMUSCULAR | Status: AC
  Filled 2020-10-22: qty 1

## 2020-10-22 MED ORDER — SODIUM CHLORIDE 0.9% FLUSH
10.0000 mL | INTRAVENOUS | Status: DC | PRN
  Administered 2020-10-22: 10 mL
  Filled 2020-10-22: qty 10

## 2020-10-22 MED ORDER — SODIUM CHLORIDE 0.9 % IV SOLN
10.0000 mg | Freq: Once | INTRAVENOUS | Status: AC
  Administered 2020-10-22: 10 mg via INTRAVENOUS
  Filled 2020-10-22: qty 10

## 2020-10-22 MED ORDER — LORAZEPAM 2 MG/ML IJ SOLN
0.5000 mg | Freq: Once | INTRAMUSCULAR | Status: AC
  Administered 2020-10-22: 0.5 mg via INTRAVENOUS

## 2020-10-22 MED ORDER — DIPHENHYDRAMINE HCL 12.5 MG/5ML PO ELIX
ORAL_SOLUTION | ORAL | Status: AC
  Filled 2020-10-22: qty 5

## 2020-10-22 MED ORDER — LORAZEPAM 2 MG/ML IJ SOLN
INTRAMUSCULAR | Status: AC
  Filled 2020-10-22: qty 1

## 2020-10-22 MED ORDER — HEPARIN SOD (PORK) LOCK FLUSH 100 UNIT/ML IV SOLN
500.0000 [IU] | Freq: Once | INTRAVENOUS | Status: AC | PRN
  Administered 2020-10-22: 500 [IU]
  Filled 2020-10-22: qty 5

## 2020-10-22 MED ORDER — FAMOTIDINE IN NACL 20-0.9 MG/50ML-% IV SOLN
20.0000 mg | Freq: Once | INTRAVENOUS | Status: AC
  Administered 2020-10-22: 20 mg via INTRAVENOUS

## 2020-10-22 NOTE — Patient Instructions (Signed)
[facility] Discharge Instructions for Patients Receiving Chemotherapy  Today you received the following chemotherapy agents: Paclitaxel (Taxol)  To help prevent nausea and vomiting after your treatment, we encourage you to take your nausea medication  as prescribed.    If you develop nausea and vomiting that is not controlled by your nausea medication, call the clinic.   BELOW ARE SYMPTOMS THAT SHOULD BE REPORTED IMMEDIATELY:  *FEVER GREATER THAN 100.5 F  *CHILLS WITH OR WITHOUT FEVER  NAUSEA AND VOMITING THAT IS NOT CONTROLLED WITH YOUR NAUSEA MEDICATION  *UNUSUAL SHORTNESS OF BREATH  *UNUSUAL BRUISING OR BLEEDING  TENDERNESS IN MOUTH AND THROAT WITH OR WITHOUT PRESENCE OF ULCERS  *URINARY PROBLEMS  *BOWEL PROBLEMS  UNUSUAL RASH Items with * indicate a potential emergency and should be followed up as soon as possible.  Feel free to call the clinic should you have any questions or concerns. The clinic phone number is [phone].  Please show the [facility] at check-in to the Emergency Department and triage nurse.  Paclitaxel injection What is this medicine? PACLITAXEL (PAK li TAX el) is a chemotherapy drug. It targets fast dividing cells, like cancer cells, and causes these cells to die. This medicine is used to treat ovarian cancer, breast cancer, lung cancer, Kaposi's sarcoma, and other cancers. This medicine may be used for other purposes; ask your health care provider or pharmacist if you have questions. COMMON BRAND NAME(S): Onxol, Taxol What should I tell my health care provider before I take this medicine? They need to know if you have any of these conditions:  history of irregular heartbeat  liver disease  low blood counts, like low white cell, platelet, or red cell counts  lung or breathing disease, like asthma  tingling of the fingers or toes, or other nerve disorder  an unusual or allergic reaction to paclitaxel, alcohol,  polyoxyethylated castor oil, other chemotherapy, other medicines, foods, dyes, or preservatives  pregnant or trying to get pregnant  breast-feeding How should I use this medicine? This drug is given as an infusion into a vein. It is administered in a hospital or clinic by a specially trained health care professional. Talk to your pediatrician regarding the use of this medicine in children. Special care may be needed. Overdosage: If you think you have taken too much of this medicine contact a poison control center or emergency room at once. NOTE: This medicine is only for you. Do not share this medicine with others. What if I miss a dose? It is important not to miss your dose. Call your doctor or health care professional if you are unable to keep an appointment. What may interact with this medicine? Do not take this medicine with any of the following medications:  live virus vaccines This medicine may also interact with the following medications:  antiviral medicines for hepatitis, HIV or AIDS  certain antibiotics like erythromycin and clarithromycin  certain medicines for fungal infections like ketoconazole and itraconazole  certain medicines for seizures like carbamazepine, phenobarbital, phenytoin  gemfibrozil  nefazodone  rifampin  St. John's wort This list may not describe all possible interactions. Give your health care provider a list of all the medicines, herbs, non-prescription drugs, or dietary supplements you use. Also tell them if you smoke, drink alcohol, or use illegal drugs. Some items may interact with your medicine. What should I watch for while using this medicine? Your condition will be monitored carefully while you are receiving this medicine. You will need  important blood work done while you are taking this medicine. This medicine can cause serious allergic reactions. To reduce your risk you will need to take other medicine(s) before treatment with this  medicine. If you experience allergic reactions like skin rash, itching or hives, swelling of the face, lips, or tongue, tell your doctor or health care professional right away. In some cases, you may be given additional medicines to help with side effects. Follow all directions for their use. This drug may make you feel generally unwell. This is not uncommon, as chemotherapy can affect healthy cells as well as cancer cells. Report any side effects. Continue your course of treatment even though you feel ill unless your doctor tells you to stop. Call your doctor or health care professional for advice if you get a fever, chills or sore throat, or other symptoms of a cold or flu. Do not treat yourself. This drug decreases your body's ability to fight infections. Try to avoid being around people who are sick. This medicine may increase your risk to bruise or bleed. Call your doctor or health care professional if you notice any unusual bleeding. Be careful brushing and flossing your teeth or using a toothpick because you may get an infection or bleed more easily. If you have any dental work done, tell your dentist you are receiving this medicine. Avoid taking products that contain aspirin, acetaminophen, ibuprofen, naproxen, or ketoprofen unless instructed by your doctor. These medicines may hide a fever. Do not become pregnant while taking this medicine. Women should inform their doctor if they wish to become pregnant or think they might be pregnant. There is a potential for serious side effects to an unborn child. Talk to your health care professional or pharmacist for more information. Do not breast-feed an infant while taking this medicine. Men are advised not to father a child while receiving this medicine. This product may contain alcohol. Ask your pharmacist or healthcare provider if this medicine contains alcohol. Be sure to tell all healthcare providers you are taking this medicine. Certain medicines,  like metronidazole and disulfiram, can cause an unpleasant reaction when taken with alcohol. The reaction includes flushing, headache, nausea, vomiting, sweating, and increased thirst. The reaction can last from 30 minutes to several hours. What side effects may I notice from receiving this medicine? Side effects that you should report to your doctor or health care professional as soon as possible:  allergic reactions like skin rash, itching or hives, swelling of the face, lips, or tongue  breathing problems  changes in vision  fast, irregular heartbeat  high or low blood pressure  mouth sores  pain, tingling, numbness in the hands or feet  signs of decreased platelets or bleeding - bruising, pinpoint red spots on the skin, black, tarry stools, blood in the urine  signs of decreased red blood cells - unusually weak or tired, feeling faint or lightheaded, falls  signs of infection - fever or chills, cough, sore throat, pain or difficulty passing urine  signs and symptoms of liver injury like dark yellow or brown urine; general ill feeling or flu-like symptoms; light-colored stools; loss of appetite; nausea; right upper belly pain; unusually weak or tired; yellowing of the eyes or skin  swelling of the ankles, feet, hands  unusually slow heartbeat Side effects that usually do not require medical attention (report to your doctor or health care professional if they continue or are bothersome):  diarrhea  hair loss  loss of appetite  muscle or  joint pain  nausea, vomiting  pain, redness, or irritation at site where injected  tiredness This list may not describe all possible side effects. Call your doctor for medical advice about side effects. You may report side effects to FDA at 1-800-FDA-1088. Where should I keep my medicine? This drug is given in a hospital or clinic and will not be stored at home. NOTE: This sheet is a summary. It may not cover all possible information.  If you have questions about this medicine, talk to your doctor, pharmacist, or health care provider.  2021 Elsevier/Gold Standard (2019-07-23 13:37:23)

## 2020-10-22 NOTE — Patient Instructions (Signed)

## 2020-10-22 NOTE — Progress Notes (Signed)
Autumn Bullock  [phone] [phone]     ID: Autumn Bullock DOB: 12/24/1975  MR#: [id number]  TSV#:[id number]  Patient Care Team: Brock Ra, PA-C as PCP - General Mauro Kaufmann, RN as Oncology Nurse Navigator Rockwell Germany, RN as Oncology Nurse Navigator Donnie Mesa, MD as Consulting Physician (General Surgery) Eppie Gibson, MD as Attending Physician (Radiation Oncology) Magrinat, Virgie Dad, MD as Consulting Physician (Oncology) Vanessa Kick, MD as Consulting Physician (Obstetrics and Gynecology) Lovett Calender, MD as Consulting Physician (Orthopedic Surgery) Scot Dock, NP OTHER MD:  CHIEF COMPLAINT: Estrogen receptor positive breast cancer  CURRENT TREATMENT: Adjuvant chemotherapy   INTERVAL HISTORY: Autumn Bullock returns today for follow up and treatment of her estrogen receptor positive breast cancer. She is unaccompanied today.    Autumn Bullock is here today to receive her next dose of weekly Paclitaxel.    REVIEW OF SYSTEMS: Autumn Bullock is noting that she is feeling a little too groggy after leaving from treatment.  She would like to decrease her anti emetic benadryl.  She denies any new issues, in particular peripheral neuropathy.  She is otherwise well and a detailed ROS was otherwise non contributory.      COVID 19 VACCINATION STATUS: Status post Pfizer x2, most recent treatment April 2021.  Patient also was diagnosed with COVID-19 disease November 2020   HISTORY OF CURRENT ILLNESS: From the original intake note:  Autumn Bullock had routine screening mammography on 06/02/2020 showing a possible abnormality in the right breast. She underwent right diagnostic mammography with tomography and right breast ultrasonography at [facility] on 06/08/2020 showing: breast density category C; 2.3 cm right breast mass at 11:30; 0.8 cm mass superficial to dominant mass; no right axillary adenopathy.  Accordingly on 06/08/2020 she proceeded to  biopsy of the right breast area in question. The pathology from this procedure ([id number]) showed: invasive and in situ mammary carcinoma, grade 2, e-cadherin positive. Both biopsied masses showed this and were found to be morphologically similar. Prognostic indicators significant for: estrogen receptor, 90% positive and progesterone receptor, 70% positive, both with strong staining intensity. Proliferation marker Ki67 at 5%. HER2 negative by immunohistochemistry (1+).  The patient's subsequent history is as detailed below.   PAST MEDICAL HISTORY: Past Medical History:  Diagnosis Date  . Family history of uterine cancer 06/16/2020  . PONV (postoperative nausea and vomiting)     PAST SURGICAL HISTORY: Past Surgical History:  Procedure Laterality Date  . BREAST LUMPECTOMY WITH RADIOACTIVE SEED AND SENTINEL LYMPH NODE BIOPSY Right 07/14/2020   Procedure: RIGHT BREAST LUMPECTOMY WITH BRACKETED RADIOACTIVE SEED AND SENTINEL LYMPH NODE BIOPSY, BLUE DYE INJECTION;  Surgeon: Donnie Mesa, MD;  Location: [facility];  Service: General;  Laterality: Right;  PEC BLOCK, BLUE DYE INJECTION  . CESAREAN SECTION     2011  . PORTACATH PLACEMENT Right 07/14/2020   Procedure: INSERTION PORT-A-CATH WITH ULTRASOUND GUIDANCE;  Surgeon: Donnie Mesa, MD;  Location: [facility];  Service: General;  Laterality: Right;  . RE-EXCISION OF BREAST LUMPECTOMY Right 08/03/2020   Procedure: RE-EXCISION OF RIGHT BREAST LUMPECTOMY SUPERIOR MARGINS;  Surgeon: Donnie Mesa, MD;  Location: [facility];  Service: General;  Laterality: Right;  LMA    FAMILY HISTORY: Family History  Problem Relation Age of Onset  . Cancer Maternal Grandmother 6       Unknown GYN.  ?Uterine  As of October 2021 her parents are both living, her father at age 1 and her mother at  13, . Autumn Bullock has 1 brother and 2 sisters. She reports uterine cancer in her maternal grandmother at age 34. There is no  family history of breast, ovarian, or colon cancer to her knowledge.   GYNECOLOGIC HISTORY:  No LMP recorded. Menarche: 45 years old Age at first live birth: 45 years old [place] P 1 LMP 05/05/2020, regular monthly periods, lasting 3 days with 1-2 heavy days Contraceptive: has used for 15 years, no complications HRT n/a  Hysterectomy? no BSO? 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 Husband [name] works in Nature conservation officer. She lives at home with [name] and their daughter [name], age 44. She attends [employer] in [place].    ADVANCED DIRECTIVES: In the absence of any documentation to the contrary, the patient's spouse is their HCPOA.    HEALTH MAINTENANCE: Social History   Tobacco Use  . Smoking status: Never Smoker  . Smokeless tobacco: Never Used  Substance Use Topics  . Alcohol use: Yes    Comment: 7/week  . Drug use: Never     Colonoscopy: 1999?  PAP: 05/31/2020  Bone density: never done   Allergies  Allergen Reactions  . Wound Dressing Adhesive     Tape etc     Current Outpatient Medications  Medication Sig Dispense Refill  . dexamethasone (DECADRON) 4 MG tablet Take 2 tablets daily for 3 days after chemo. Take with food. (Patient not taking: Reported on 10/22/2020) 30 tablet 1  . docusate sodium (COLACE) 100 MG capsule Take 2 capsules (200 mg total) by mouth 2 (two) times daily. (Patient not taking: Reported on 10/22/2020) 80 capsule 0  . doxycycline (VIBRA-TABS) 100 MG tablet Take 1 tablet (100 mg total) by mouth daily. (Patient not taking: Reported on 10/22/2020) 60 tablet 1  . ketoconazole (NIZORAL) 2 % cream Apply 1 application topically daily. (Patient not taking: Reported on 10/22/2020) 15 g 0  . lidocaine-prilocaine (EMLA) cream Apply to affected area once (Patient not taking: Reported on 10/22/2020) 30 g 3  . LORazepam (ATIVAN) 0.5 MG tablet Take 1 tablet (0.5 mg total) by mouth at bedtime  as needed (Nausea or vomiting). (Patient not taking: Reported on 10/22/2020) 30 tablet 0  . metroNIDAZOLE (METROGEL) 1 % gel Apply topically daily. (Patient not taking: Reported on 10/22/2020) 45 g 0  . omeprazole (PRILOSEC) 40 MG capsule Take 1 capsule (40 mg total) by mouth at bedtime. (Patient not taking: Reported on 10/22/2020) 90 capsule 4  . ondansetron (ZOFRAN ODT) 4 MG disintegrating tablet Take 1 tablet (4 mg total) by mouth every 8 (eight) hours as needed for nausea or vomiting. (Patient not taking: Reported on 10/22/2020) 20 tablet 2  . prochlorperazine (COMPAZINE) 10 MG tablet Take 1 tablet (10 mg total) by mouth every 6 (six) hours as needed (Nausea or vomiting). (Patient not taking: Reported on 10/22/2020) 30 tablet 1   No current facility-administered medications for this visit.    OBJECTIVE: White woman in no acute distress  Vitals:   10/22/20 1356  BP: 127/79  Pulse: 69  Resp: 17  Temp: (!) 97.5 F (36.4 C)  SpO2: 98%     Body mass index is 27.01 kg/m.   Wt Readings from Last 3 Encounters:  10/22/20 162 lb 4.8 oz (73.6 kg)  10/15/20 161 lb 1.6 oz (73.1 kg)  09/28/20 161 lb 6.4 oz (73.2 kg)      ECOG FS:1 - Symptomatic but completely ambulatory GENERAL: Patient is  a well appearing female in no acute distress HEENT:  Sclerae anicteric. Mask in place. Neck is supple.  NODES:  No cervical, supraclavicular, or axillary lymphadenopathy palpated.  BREAST EXAM:  Deferred. LUNGS:  Clear to auscultation bilaterally.  No wheezes or rhonchi. HEART:  Regular rate and rhythm. No murmur appreciated. ABDOMEN:  Soft, nontender.  Positive, normoactive bowel sounds. No organomegaly palpated. MSK:  No focal spinal tenderness to palpation. Full range of motion bilaterally in the upper extremities. EXTREMITIES:  No peripheral edema.   SKIN:  Clear with no obvious rashes or skin changes. No nail dyscrasia. NEURO:  Nonfocal. Well oriented.  Appropriate affect.     LAB  RESULTS:  CMP     Component Value Date/Time   NA 139 10/15/2020 0909   K 4.0 10/15/2020 0909   CL 106 10/15/2020 0909   CO2 24 10/15/2020 0909   GLUCOSE 112 (H) 10/15/2020 0909   BUN 11 10/15/2020 0909   CREATININE 0.74 10/15/2020 0909   CREATININE 0.73 08/17/2020 0905   CALCIUM 9.0 10/15/2020 0909   PROT 7.2 10/15/2020 0909   ALBUMIN 4.4 10/15/2020 0909   AST 20 10/15/2020 0909   AST 18 08/17/2020 0905   ALT 26 10/15/2020 0909   ALT 18 08/17/2020 0905   ALKPHOS 92 10/15/2020 0909   BILITOT 0.4 10/15/2020 0909   BILITOT 0.7 08/17/2020 0905   GFRNONAA >60 10/15/2020 0909   GFRNONAA >60 08/17/2020 0905    No results found for: TOTALPROTELP, ALBUMINELP, A1GS, A2GS, BETS, BETA2SER, GAMS, MSPIKE, SPEI  Lab Results  Component Value Date   WBC 3.6 (L) 10/22/2020   NEUTROABS 2.0 10/22/2020   HGB 10.1 (L) 10/22/2020   HCT 28.9 (L) 10/22/2020   MCV 94.1 10/22/2020   PLT 437 (H) 10/22/2020    No results found for: LABCA2  No components found for: [id number]  No results for input(s): INR in the last 168 hours.  No results found for: LABCA2  No results found for: VFI433  No results found for: IRJ188  No results found for: CZY606  No results found for: CA2729  No components found for: HGQUANT  No results found for: CEA1 / No results found for: CEA1   No results found for: AFPTUMOR  No results found for: CHROMOGRNA  No results found for: KPAFRELGTCHN, LAMBDASER, KAPLAMBRATIO (kappa/lambda light chains)  No results found for: HGBA, HGBA2QUANT, HGBFQUANT, HGBSQUAN (Hemoglobinopathy evaluation)   No results found for: LDH  No results found for: IRON, TIBC, IRONPCTSAT (Iron and TIBC)  No results found for: FERRITIN  Urinalysis    Component Value Date/Time   COLORURINE YELLOW 09/02/2020 0843   APPEARANCEUR HAZY (A) 09/02/2020 0843   LABSPEC 1.010 09/02/2020 0843   PHURINE 6.0 09/02/2020 0843   GLUCOSEU NEGATIVE 09/02/2020 0843   HGBUR SMALL (A)  09/02/2020 0843   BILIRUBINUR NEGATIVE 09/02/2020 0843   KETONESUR NEGATIVE 09/02/2020 0843   PROTEINUR NEGATIVE 09/02/2020 0843   NITRITE NEGATIVE 09/02/2020 0843   LEUKOCYTESUR TRACE (A) 09/02/2020 0843    STUDIES: No results found.   ELIGIBLE FOR AVAILABLE RESEARCH PROTOCOL: AET  ASSESSMENT: 45 y.o. [place] woman status post right breast upper outer quadrant biopsy 06/08/2020 for a clinical mT2 N0, stage IB invasive ductal carcinoma, grade 2, estrogen and progesterone receptor positive, HER-2 not amplified, with an MIB-1 of 5%.  (1) genetics testing 06/22/2020 through the Common Hereditary Cancers Panel offered by [facility] found no deleterious mutations in APC, ATM, AXIN2, BARD1, BMPR1A, BRCA1, BRCA2, BRIP1, CDH1, CDK4, CDKN2A (  p14ARF), CDKN2A (p16INK4a), CHEK2, CTNNA1, DICER1, EPCAM (Deletion/duplication testing only), GREM1 (promoter region deletion/duplication testing only), KIT, MEN1, MLH1, MSH2, MSH3, MSH6, MUTYH, NBN, NF1, NHTL1, PALB2, PDGFRA, PMS2, POLD1, POLE, PTEN, RAD50, RAD51C, RAD51D, RNF43, SDHB, SDHC, SDHD, SMAD4, SMARCA4. STK11, TP53, TSC1, TSC2, and VHL.  The following genes were evaluated for sequence changes only: SDHA and HOXB13 c.251G>A variant only.  (a)  a variant of uncertain significance in MSH3 at c.2731T>G (p.Leu911Val).   (2) right lumpectomy and sentinel lymph node sampling 07/14/2020 showed a pT2 pN1(mic), stage IIA invasive ductal carcinoma, grade 2, with a positive superior margin  (a) additional surgery 08/03/2020  (3) MammaPrint obtained from the initial biopsy showed high risk, predicting a 93% 5-year disease-free survival with chemotherapy and significant benefit from chemotherapy (greater than 12%).  (4) cyclophosphamide and doxorubicin in dose dense fashion x4 to start 08/03/2020, to be followed by paclitaxel weekly x12  (a) echo 07/09/2020 shows an ejection fraction in the 60-65% range.  (5) adjuvant radiation to follow  (6) antiestrogens to  start at the completion of local treatment   PLAN: Harlowe will proceed with her second cycle of weekly Paclitaxel.  I decreased her IV benadryl that she receives before the Paclitaxel.  Hopefully this will help her with the grogginess she experiences following her treatment.  We reviewed her anti nausea medicaitons, and overall treatment plan.    She will return in one week for labs and treatment.  We will see her with every other Paclitaxel treatment until she reaches cycle #8 at which time we will then see her weekly.  She knows to call for any questions that may arise between now and her next appointment.  We are happy to see her sooner if needed.  Total encounter time 20 minutes.Wilber Bihari, NP 10/22/20 2:10 PM Medical Oncology and Hematology [facility] [place], [place] [address] Tel. [phone]    Fax. [phone]   *Total Encounter Time as defined by the Centers for Medicare and Medicaid Services includes, in addition to the face-to-face time of a patient visit (documented in the note above) non-face-to-face time: obtaining and reviewing outside history, ordering and reviewing medications, tests or procedures, care coordination (communications with other health care professionals or caregivers) and documentation in the medical record.

## 2020-10-25 ENCOUNTER — Telehealth: Payer: Self-pay | Admitting: Adult Health

## 2020-10-25 NOTE — Telephone Encounter (Signed)
No 2/18 los. No changes made to pt's schedule.  °

## 2020-10-27 NOTE — Progress Notes (Signed)
Autumn Bullock  [phone] [phone]     ID: Autumn Bullock DOB: 12-Jan-1976  MR#: [id number]  JJK#:[id number]  Patient Care Team: Brock Ra, PA-C as PCP - General Mauro Kaufmann, RN as Oncology Nurse Navigator Rockwell Germany, RN as Oncology Nurse Navigator Donnie Mesa, MD as Consulting Physician (General Surgery) Eppie Gibson, MD as Attending Physician (Radiation Oncology) Magrinat, Virgie Dad, MD as Consulting Physician (Oncology) Vanessa Kick, MD as Consulting Physician (Obstetrics and Gynecology) Lovett Calender, MD as Consulting Physician (Orthopedic Surgery) Chauncey Cruel, MD OTHER MD:  CHIEF COMPLAINT: Estrogen receptor positive breast cancer  CURRENT TREATMENT: Adjuvant chemotherapy   INTERVAL HISTORY: Autumn Bullock returns today for follow up and treatment of her estrogen receptor positive breast cancer. She is accompanied by her mother  She continues on weekly Paclitaxel.  Today is week 3.   REVIEW OF SYSTEMS: Autumn Bullock is feeling better with the current treatment and tells me she now realizes how bad she actually felt with the earlier chemotherapy, which was more intense.  She thinks she will be able to return to work Monday through Wednesday although not of course Thursday or Friday since on the day of treatment and the day after she does feel fatigued.  She also has blurred vision.  She has stopped having periods and is having hot flashes.  Detailed review of systems today was otherwise stable   COVID 19 VACCINATION STATUS: Status post Pfizer x2, most recent treatment April 2021.  Patient also was diagnosed with COVID-19 disease November 2020   HISTORY OF CURRENT ILLNESS: From the original intake note:  Foye had routine screening mammography on 06/02/2020 showing a possible abnormality in the right breast. She underwent right diagnostic mammography with tomography and right breast ultrasonography at [facility] on  06/08/2020 showing: breast density category C; 2.3 cm right breast mass at 11:30; 0.8 cm mass superficial to dominant mass; no right axillary adenopathy.  Accordingly on 06/08/2020 she proceeded to biopsy of the right breast area in question. The pathology from this procedure ([id number]) showed: invasive and in situ mammary carcinoma, grade 2, e-cadherin positive. Both biopsied masses showed this and were found to be morphologically similar. Prognostic indicators significant for: estrogen receptor, 90% positive and progesterone receptor, 70% positive, both with strong staining intensity. Proliferation marker Ki67 at 5%. HER2 negative by immunohistochemistry (1+).  The patient's subsequent history is as detailed below.   PAST MEDICAL HISTORY: Past Medical History:  Diagnosis Date  . Family history of uterine cancer 06/16/2020  . PONV (postoperative nausea and vomiting)     PAST SURGICAL HISTORY: Past Surgical History:  Procedure Laterality Date  . BREAST LUMPECTOMY WITH RADIOACTIVE SEED AND SENTINEL LYMPH NODE BIOPSY Right 07/14/2020   Procedure: RIGHT BREAST LUMPECTOMY WITH BRACKETED RADIOACTIVE SEED AND SENTINEL LYMPH NODE BIOPSY, BLUE DYE INJECTION;  Surgeon: Donnie Mesa, MD;  Location: [place];  Service: General;  Laterality: Right;  PEC BLOCK, BLUE DYE INJECTION  . CESAREAN SECTION     2011  . PORTACATH PLACEMENT Right 07/14/2020   Procedure: INSERTION PORT-A-CATH WITH ULTRASOUND GUIDANCE;  Surgeon: Donnie Mesa, MD;  Location: [place];  Service: General;  Laterality: Right;  . RE-EXCISION OF BREAST LUMPECTOMY Right 08/03/2020   Procedure: RE-EXCISION OF RIGHT BREAST LUMPECTOMY SUPERIOR MARGINS;  Surgeon: Donnie Mesa, MD;  Location: [place];  Service: General;  Laterality: Right;  LMA    FAMILY HISTORY: Family History  Problem Relation Age of Onset  .  Cancer Maternal Grandmother 38       Unknown GYN.  ?Uterine  As of  October 2021 her parents are both living, her father at age 28 and her mother at 78, . Autumn Bullock has 1 brother and 2 sisters. She reports uterine cancer in her maternal grandmother at age 56. There is no family history of breast, ovarian, or colon cancer to her knowledge.   GYNECOLOGIC HISTORY:  No LMP recorded. Menarche: 46 years old Age at first live birth: 45 years old Autumn Bullock P 1 LMP 05/05/2020, regular monthly periods, lasting 3 days with 1-2 heavy days Contraceptive: has used for 15 years, no complications HRT n/a  Hysterectomy? no BSO? 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 Husband Autumn Bullock works in Nature conservation officer. She lives at home with Autumn Bullock and their daughter Autumn Bullock, age 34. She attends [employer] in [place].    ADVANCED DIRECTIVES: In the absence of any documentation to the contrary, the patient's spouse is their HCPOA.    HEALTH MAINTENANCE: Social History   Tobacco Use  . Smoking status: Never Smoker  . Smokeless tobacco: Never Used  Substance Use Topics  . Alcohol use: Yes    Comment: 7/week  . Drug use: Never     Colonoscopy: 1999?  PAP: 05/31/2020  Bone density: never done   Allergies  Allergen Reactions  . Wound Dressing Adhesive     Tape etc     Current Outpatient Medications  Medication Sig Dispense Refill  . dexamethasone (DECADRON) 4 MG tablet Take 2 tablets daily for 3 days after chemo. Take with food. (Patient not taking: Reported on 10/22/2020) 30 tablet 1  . docusate sodium (COLACE) 100 MG capsule Take 2 capsules (200 mg total) by mouth 2 (two) times daily. (Patient not taking: Reported on 10/22/2020) 80 capsule 0  . doxycycline (VIBRA-TABS) 100 MG tablet Take 1 tablet (100 mg total) by mouth daily. (Patient not taking: Reported on 10/22/2020) 60 tablet 1  . ketoconazole (NIZORAL) 2 % cream Apply 1 application topically daily. (Patient not taking: Reported on 10/22/2020) 15 g 0   . lidocaine-prilocaine (EMLA) cream Apply to affected area once (Patient not taking: Reported on 10/22/2020) 30 g 3  . LORazepam (ATIVAN) 0.5 MG tablet Take 1 tablet (0.5 mg total) by mouth at bedtime as needed (Nausea or vomiting). (Patient not taking: Reported on 10/22/2020) 30 tablet 0  . metroNIDAZOLE (METROGEL) 1 % gel Apply topically daily. (Patient not taking: Reported on 10/22/2020) 45 g 0  . omeprazole (PRILOSEC) 40 MG capsule Take 1 capsule (40 mg total) by mouth at bedtime. (Patient not taking: Reported on 10/22/2020) 90 capsule 4  . ondansetron (ZOFRAN ODT) 4 MG disintegrating tablet Take 1 tablet (4 mg total) by mouth every 8 (eight) hours as needed for nausea or vomiting. (Patient not taking: Reported on 10/22/2020) 20 tablet 2  . prochlorperazine (COMPAZINE) 10 MG tablet Take 1 tablet (10 mg total) by mouth every 6 (six) hours as needed (Nausea or vomiting). (Patient not taking: Reported on 10/22/2020) 30 tablet 1   No current facility-administered medications for this visit.    OBJECTIVE: White woman in no acute distress  Vitals:   10/28/20 1212  BP: 100/77  Pulse: 75  Resp: 18  Temp: 97.6 F (36.4 C)  SpO2: 100%     Body mass index is 27.34 kg/m.   Wt Readings from Last 3 Encounters:  10/28/20 164 lb 4.8 oz (74.5  kg)  10/22/20 162 lb 4.8 oz (73.6 kg)  10/15/20 161 lb 1.6 oz (73.1 kg)     ECOG FS:1 - Symptomatic but completely ambulatory  Sclerae unicteric, EOMs intact Wearing a mask No cervical or supraclavicular adenopathy Lungs no rales or rhonchi Heart regular rate and rhythm Abd soft, nontender, positive bowel sounds MSK no focal spinal tenderness, no upper extremity lymphedema Neuro: nonfocal, well oriented, appropriate affect Breasts: Deferred   LAB RESULTS:  CMP     Component Value Date/Time   NA 136 10/28/2020 1204   K 4.0 10/28/2020 1204   CL 105 10/28/2020 1204   CO2 23 10/28/2020 1204   GLUCOSE 111 (H) 10/28/2020 1204   BUN 14 10/28/2020 1204    CREATININE 0.72 10/28/2020 1204   CREATININE 0.73 08/17/2020 0905   CALCIUM 8.8 (L) 10/28/2020 1204   PROT 6.9 10/28/2020 1204   ALBUMIN 4.2 10/28/2020 1204   AST 29 10/28/2020 1204   AST 18 08/17/2020 0905   ALT 40 10/28/2020 1204   ALT 18 08/17/2020 0905   ALKPHOS 60 10/28/2020 1204   BILITOT 0.5 10/28/2020 1204   BILITOT 0.7 08/17/2020 0905   GFRNONAA >60 10/28/2020 1204   GFRNONAA >60 08/17/2020 0905    No results found for: TOTALPROTELP, ALBUMINELP, A1GS, A2GS, BETS, BETA2SER, GAMS, MSPIKE, SPEI  Lab Results  Component Value Date   WBC 2.7 (L) 10/28/2020   NEUTROABS 1.5 (L) 10/28/2020   HGB 9.7 (L) 10/28/2020   HCT 28.5 (L) 10/28/2020   MCV 96.6 10/28/2020   PLT 278 10/28/2020    No results found for: LABCA2  No components found for: MEQAST419  No results for input(s): INR in the last 168 hours.  No results found for: LABCA2  No results found for: QQI297  No results found for: LGX211  No results found for: HER740  No results found for: CA2729  No components found for: HGQUANT  No results found for: CEA1 / No results found for: CEA1   No results found for: AFPTUMOR  No results found for: CHROMOGRNA  No results found for: KPAFRELGTCHN, LAMBDASER, KAPLAMBRATIO (kappa/lambda light chains)  No results found for: HGBA, HGBA2QUANT, HGBFQUANT, HGBSQUAN (Hemoglobinopathy evaluation)   No results found for: LDH  No results found for: IRON, TIBC, IRONPCTSAT (Iron and TIBC)  No results found for: FERRITIN  Urinalysis    Component Value Date/Time   COLORURINE YELLOW 09/02/2020 0843   APPEARANCEUR HAZY (A) 09/02/2020 0843   LABSPEC 1.010 09/02/2020 0843   PHURINE 6.0 09/02/2020 0843   GLUCOSEU NEGATIVE 09/02/2020 0843   HGBUR SMALL (A) 09/02/2020 0843   BILIRUBINUR NEGATIVE 09/02/2020 0843   KETONESUR NEGATIVE 09/02/2020 0843   PROTEINUR NEGATIVE 09/02/2020 0843   NITRITE NEGATIVE 09/02/2020 0843   LEUKOCYTESUR TRACE (A) 09/02/2020 0843     STUDIES: No results found.   ELIGIBLE FOR AVAILABLE RESEARCH PROTOCOL: AET  ASSESSMENT: 45 y.o. [place] woman status post right breast upper outer quadrant biopsy 06/08/2020 for a clinical mT2 N0, stage IB invasive ductal carcinoma, grade 2, estrogen and progesterone receptor positive, HER-2 not amplified, with an MIB-1 of 5%.  (1) genetics testing 06/22/2020 through the Common Hereditary Cancers Panel offered by [facility] found no deleterious mutations in APC, ATM, AXIN2, BARD1, BMPR1A, BRCA1, BRCA2, BRIP1, CDH1, CDK4, CDKN2A (p14ARF), CDKN2A (p16INK4a), CHEK2, CTNNA1, DICER1, EPCAM (Deletion/duplication testing only), GREM1 (promoter region deletion/duplication testing only), KIT, MEN1, MLH1, MSH2, MSH3, MSH6, MUTYH, NBN, NF1, NHTL1, PALB2, PDGFRA, PMS2, POLD1, POLE, PTEN, RAD50, RAD51C, RAD51D, RNF43, SDHB,  SDHC, SDHD, SMAD4, SMARCA4. STK11, TP53, TSC1, TSC2, and VHL.  The following genes were evaluated for sequence changes only: SDHA and HOXB13 c.251G>A variant only.  (a)  a variant of uncertain significance in MSH3 at c.2731T>G (p.Leu911Val).   (2) right lumpectomy and sentinel lymph node sampling 07/14/2020 showed a pT2 pN1(mic), stage IIA invasive ductal carcinoma, grade 2, with a positive superior margin  (a) additional surgery 08/03/2020  (3) MammaPrint obtained from the initial biopsy showed high risk, predicting a 93% 5-year disease-free survival with chemotherapy and significant benefit from chemotherapy (greater than 12%).  (4) cyclophosphamide and doxorubicin in dose dense fashion x4 to start 08/03/2020, to be followed by paclitaxel weekly x12  (a) echo 07/09/2020 shows an ejection fraction in the 60-65% range.  (5) adjuvant radiation to follow  (6) antiestrogens to start at the completion of local treatment   PLAN: Delena is finally recovering from the doxorubicin/cyclophosphamide intense treatment and she is tolerating the weekly paclitaxel quite well.  She has no  peripheral neuropathy.  Her port is generally working well.  We are proceeding with the third of 12 planned weekly doses today.  I think she will be able to return to work at least for the earlier part of the week.  She will not be able to work on Thursdays or Fridays.  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  I wrote her a letter with that information so she can take it to the school.  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  She understands she has a 50% chance of her periods coming back or of being in permanent menopause.  I reassured her that her blurred vision is due to accommodation problems and that it will eventually clear when she is finished with chemotherapy.  At this point we are going to start seeing her with every other treatment.  She knows to call for any other problem that may develop before the next visit.  Total encounter time 25 minutes.Sarajane Jews C. Magrinat, MD 10/28/20 12:42 PM Medical Oncology and Hematology [facility] [place], [place] [address] Tel. [phone]    Fax. [phone]   I, Wilburn Mylar, am acting as scribe for Dr. Virgie Dad. Magrinat.  I, Lurline Del MD, have reviewed the above documentation for accuracy and completeness, and I agree with the above.    *Total Encounter Time as defined by the Centers for Medicare and Medicaid Services includes, in addition to the face-to-face time of a patient visit (documented in the note above) non-face-to-face time: obtaining and reviewing outside history, ordering and reviewing medications, tests or procedures, care coordination (communications with other health care professionals or caregivers) and documentation in the medical record.

## 2020-10-28 ENCOUNTER — Inpatient Hospital Stay: Payer: BC Managed Care – PPO | Admitting: Oncology

## 2020-10-28 ENCOUNTER — Other Ambulatory Visit: Payer: Self-pay | Admitting: Oncology

## 2020-10-28 ENCOUNTER — Inpatient Hospital Stay: Payer: BC Managed Care – PPO

## 2020-10-28 ENCOUNTER — Other Ambulatory Visit: Payer: Self-pay

## 2020-10-28 ENCOUNTER — Encounter: Payer: Self-pay | Admitting: Oncology

## 2020-10-28 VITALS — BP 100/77 | HR 75 | Temp 97.6°F | Resp 18 | Ht 65.0 in | Wt 164.3 lb

## 2020-10-28 DIAGNOSIS — Z170 Estrogen receptor positive status [ER+]: Secondary | ICD-10-CM

## 2020-10-28 DIAGNOSIS — Z95828 Presence of other vascular implants and grafts: Secondary | ICD-10-CM

## 2020-10-28 DIAGNOSIS — C50411 Malignant neoplasm of upper-outer quadrant of right female breast: Secondary | ICD-10-CM

## 2020-10-28 DIAGNOSIS — Z5111 Encounter for antineoplastic chemotherapy: Secondary | ICD-10-CM | POA: Diagnosis not present

## 2020-10-28 LAB — CBC WITH DIFFERENTIAL/PLATELET
Abs Immature Granulocytes: 0.02 10*3/uL (ref 0.00–0.07)
Basophils Absolute: 0 10*3/uL (ref 0.0–0.1)
Basophils Relative: 1 %
Eosinophils Absolute: 0.1 10*3/uL (ref 0.0–0.5)
Eosinophils Relative: 3 %
HCT: 28.5 % — ABNORMAL LOW (ref 36.0–46.0)
Hemoglobin: 9.7 g/dL — ABNORMAL LOW (ref 12.0–15.0)
Immature Granulocytes: 1 %
Lymphocytes Relative: 33 %
Lymphs Abs: 0.9 10*3/uL (ref 0.7–4.0)
MCH: 32.9 pg (ref 26.0–34.0)
MCHC: 34 g/dL (ref 30.0–36.0)
MCV: 96.6 fL (ref 80.0–100.0)
Monocytes Absolute: 0.3 10*3/uL (ref 0.1–1.0)
Monocytes Relative: 9 %
Neutro Abs: 1.5 10*3/uL — ABNORMAL LOW (ref 1.7–7.7)
Neutrophils Relative %: 53 %
Platelets: 278 10*3/uL (ref 150–400)
RBC: 2.95 MIL/uL — ABNORMAL LOW (ref 3.87–5.11)
RDW: 16.3 % — ABNORMAL HIGH (ref 11.5–15.5)
WBC: 2.7 10*3/uL — ABNORMAL LOW (ref 4.0–10.5)
nRBC: 0 % (ref 0.0–0.2)

## 2020-10-28 LAB — COMPREHENSIVE METABOLIC PANEL
ALT: 40 U/L (ref 0–44)
AST: 29 U/L (ref 15–41)
Albumin: 4.2 g/dL (ref 3.5–5.0)
Alkaline Phosphatase: 60 U/L (ref 38–126)
Anion gap: 8 (ref 5–15)
BUN: 14 mg/dL (ref 6–20)
CO2: 23 mmol/L (ref 22–32)
Calcium: 8.8 mg/dL — ABNORMAL LOW (ref 8.9–10.3)
Chloride: 105 mmol/L (ref 98–111)
Creatinine, Ser: 0.72 mg/dL (ref 0.44–1.00)
GFR, Estimated: >60 mL/min (ref >60)
Glucose, Bld: 111 mg/dL — ABNORMAL HIGH (ref 70–99)
Potassium: 4 mmol/L (ref 3.5–5.1)
Sodium: 136 mmol/L (ref 135–145)
Total Bilirubin: 0.5 mg/dL (ref 0.3–1.2)
Total Protein: 6.9 g/dL (ref 6.5–8.1)

## 2020-10-28 LAB — PREGNANCY, URINE: Preg Test, Ur: NEGATIVE

## 2020-10-28 MED ORDER — FAMOTIDINE IN NACL 20-0.9 MG/50ML-% IV SOLN
INTRAVENOUS | Status: AC
  Filled 2020-10-28: qty 50

## 2020-10-28 MED ORDER — DEXAMETHASONE SODIUM PHOSPHATE 100 MG/10ML IJ SOLN: 4.0000 mg | Freq: Once | INTRAMUSCULAR | Status: DC

## 2020-10-28 MED ORDER — SODIUM CHLORIDE 0.9% FLUSH
10.0000 mL | INTRAVENOUS | Status: DC | PRN
  Administered 2020-10-28: 10 mL
  Filled 2020-10-28: qty 10

## 2020-10-28 MED ORDER — SODIUM CHLORIDE 0.9% FLUSH
10.0000 mL | Freq: Once | INTRAVENOUS | Status: AC
  Administered 2020-10-28: 10 mL
  Filled 2020-10-28: qty 10

## 2020-10-28 MED ORDER — DEXAMETHASONE SODIUM PHOSPHATE 10 MG/ML IJ SOLN
INTRAMUSCULAR | Status: AC
  Filled 2020-10-28: qty 1

## 2020-10-28 MED ORDER — SODIUM CHLORIDE 0.9 % IV SOLN
80.0000 mg/m2 | Freq: Once | INTRAVENOUS | Status: AC
  Administered 2020-10-28: 144 mg via INTRAVENOUS
  Filled 2020-10-28: qty 24

## 2020-10-28 MED ORDER — SODIUM CHLORIDE 0.9 % IV SOLN
Freq: Once | INTRAVENOUS | Status: AC
  Filled 2020-10-28: qty 250

## 2020-10-28 MED ORDER — HEPARIN SOD (PORK) LOCK FLUSH 100 UNIT/ML IV SOLN
500.0000 [IU] | Freq: Once | INTRAVENOUS | Status: AC | PRN
  Administered 2020-10-28: 500 [IU]
  Filled 2020-10-28: qty 5

## 2020-10-28 MED ORDER — LORAZEPAM 2 MG/ML IJ SOLN
0.5000 mg | Freq: Once | INTRAMUSCULAR | Status: AC
  Administered 2020-10-28: 0.5 mg via INTRAVENOUS

## 2020-10-28 MED ORDER — DEXAMETHASONE SODIUM PHOSPHATE 10 MG/ML IJ SOLN
4.0000 mg | Freq: Once | INTRAMUSCULAR | Status: AC
  Administered 2020-10-28: 4 mg via INTRAVENOUS

## 2020-10-28 MED ORDER — FAMOTIDINE IN NACL 20-0.9 MG/50ML-% IV SOLN
20.0000 mg | Freq: Once | INTRAVENOUS | Status: AC
  Administered 2020-10-28: 20 mg via INTRAVENOUS

## 2020-10-28 MED ORDER — DIPHENHYDRAMINE HCL 50 MG/ML IJ SOLN
25.0000 mg | Freq: Once | INTRAMUSCULAR | Status: AC
  Administered 2020-10-28: 25 mg via INTRAVENOUS

## 2020-10-28 MED ORDER — DIPHENHYDRAMINE HCL 50 MG/ML IJ SOLN
INTRAMUSCULAR | Status: AC
  Filled 2020-10-28: qty 1

## 2020-10-28 MED ORDER — LORAZEPAM 2 MG/ML IJ SOLN
INTRAMUSCULAR | Status: AC
  Filled 2020-10-28: qty 1

## 2020-10-28 MED ORDER — LIDOCAINE-PRILOCAINE 2.5-2.5 % EX CREA: TOPICAL_CREAM | CUTANEOUS | 3 refills | Status: DC

## 2020-10-28 MED FILL — LIDOCAINE-PRILOCAINE CREAM: 2.5-2.5 | 30 days supply | Qty: 30 | Fill #0

## 2020-10-28 NOTE — Progress Notes (Signed)
Per MD proceed with treatment with ANC of 1.5

## 2020-10-28 NOTE — Addendum Note (Signed)
Addended by: Chauncey Cruel on: 10/28/2020 01:08 PM   Modules accepted: Orders

## 2020-10-28 NOTE — Patient Instructions (Signed)
Implanted Port Insertion, Care After This sheet gives you information about how to care for yourself after your procedure. Your health care provider may also give you more specific instructions. If you have problems or questions, contact your health care provider. What can I expect after the procedure? After the procedure, it is common to have:  Discomfort at the port insertion site.  Bruising on the skin over the port. This should improve over 3-4 days. Follow these instructions at home: Port care  After your port is placed, you will get a manufacturer's information card. The card has information about your port. Keep this card with you at all times.  Take care of the port as told by your health care provider. Ask your health care provider if you or a family member can get training for taking care of the port at home. A home health care nurse may also take care of the port.  Make sure to remember what type of port you have. Incision care  Follow instructions from your health care provider about how to take care of your port insertion site. Make sure you: ? Wash your hands with soap and water before and after you change your bandage (dressing). If soap and water are not available, use hand sanitizer. ? Change your dressing as told by your health care provider. ? Leave stitches (sutures), skin glue, or adhesive strips in place. These skin closures may need to stay in place for 2 weeks or longer. If adhesive strip edges start to loosen and curl up, you may trim the loose edges. Do not remove adhesive strips completely unless your health care provider tells you to do that.  Check your port insertion site every day for signs of infection. Check for: ? Redness, swelling, or pain. ? Fluid or blood. ? Warmth. ? Pus or a bad smell.      Activity  Return to your normal activities as told by your health care provider. Ask your health care provider what activities are safe for you.  Do not  lift anything that is heavier than 10 lb (4.5 kg), or the limit that you are told, until your health care provider says that it is safe. General instructions  Take over-the-counter and prescription medicines only as told by your health care provider.  Do not take baths, swim, or use a hot tub until your health care provider approves. Ask your health care provider if you may take showers. You may only be allowed to take sponge baths.  Do not drive for 24 hours if you were given a sedative during your procedure.  Wear a medical alert bracelet in case of an emergency. This will tell any health care providers that you have a port.  Keep all follow-up visits as told by your health care provider. This is important. Contact a health care provider if:  You cannot flush your port with saline as directed, or you cannot draw blood from the port.  You have a fever or chills.  You have redness, swelling, or pain around your port insertion site.  You have fluid or blood coming from your port insertion site.  Your port insertion site feels warm to the touch.  You have pus or a bad smell coming from the port insertion site. Get help right away if:  You have chest pain or shortness of breath.  You have bleeding from your port that you cannot control. Summary  Take care of the port as told by your   health care provider. Keep the manufacturer's information card with you at all times.  Change your dressing as told by your health care provider.  Contact a health care provider if you have a fever or chills or if you have redness, swelling, or pain around your port insertion site.  Keep all follow-up visits as told by your health care provider. This information is not intended to replace advice given to you by your health care provider. Make sure you discuss any questions you have with your health care provider. Document Revised: 03/19/2018 Document Reviewed: 03/19/2018 Elsevier Patient Education   2021 Elsevier Inc.  

## 2020-10-28 NOTE — Patient Instructions (Signed)
[facility] Discharge Instructions for Patients Receiving Chemotherapy  Today you received the following chemotherapy agents Paclitaxel  To help prevent nausea and vomiting after your treatment, we encourage you to take your nausea medication as directed   If you develop nausea and vomiting that is not controlled by your nausea medication, call the clinic.   BELOW ARE SYMPTOMS THAT SHOULD BE REPORTED IMMEDIATELY:  *FEVER GREATER THAN 100.5 F  *CHILLS WITH OR WITHOUT FEVER  NAUSEA AND VOMITING THAT IS NOT CONTROLLED WITH YOUR NAUSEA MEDICATION  *UNUSUAL SHORTNESS OF BREATH  *UNUSUAL BRUISING OR BLEEDING  TENDERNESS IN MOUTH AND THROAT WITH OR WITHOUT PRESENCE OF ULCERS  *URINARY PROBLEMS  *BOWEL PROBLEMS  UNUSUAL RASH Items with * indicate a potential emergency and should be followed up as soon as possible.  Feel free to call the clinic should you have any questions or concerns. The clinic phone number is [phone].  Please show the CHEMO ALERT CARD at check-in to the Emergency Department and triage nurse.   

## 2020-10-29 ENCOUNTER — Ambulatory Visit: Payer: BC Managed Care – PPO

## 2020-10-29 ENCOUNTER — Other Ambulatory Visit: Payer: BC Managed Care – PPO

## 2020-10-29 ENCOUNTER — Telehealth: Payer: Self-pay | Admitting: Oncology

## 2020-10-29 NOTE — Telephone Encounter (Signed)
Scheduled appts per 2/24 los. Pt to get updated appt calendar at next visit per appt notes,.

## 2020-11-01 ENCOUNTER — Ambulatory Visit: Payer: BC Managed Care – PPO | Attending: Surgery

## 2020-11-01 ENCOUNTER — Other Ambulatory Visit: Payer: Self-pay

## 2020-11-01 DIAGNOSIS — Z483 Aftercare following surgery for neoplasm: Secondary | ICD-10-CM | POA: Insufficient documentation

## 2020-11-01 NOTE — Therapy (Signed)
[place], [place], [address] Phone: [phone]   Fax:  [phone]  Physical Therapy Treatment  Patient Details  Name: Autumn Bullock MRN: [id number] Date of Birth: November 16, 1975 Referring Provider (PT): Dr. Donnie Mesa   Encounter Date: 11/01/2020   PT End of Session - 11/01/20 0832    Visit Number 2    Number of Visits 2    Date for PT Re-Evaluation 08/11/20    PT Start Time 0815    PT Stop Time 0831    PT Time Calculation (min) 16 min    Activity Tolerance Patient tolerated treatment well    Behavior During Therapy [facility] for tasks assessed/performed           Past Medical History:  Diagnosis Date  . Family history of uterine cancer 06/16/2020  . PONV (postoperative nausea and vomiting)     Past Surgical History:  Procedure Laterality Date  . BREAST LUMPECTOMY WITH RADIOACTIVE SEED AND SENTINEL LYMPH NODE BIOPSY Right 07/14/2020   Procedure: RIGHT BREAST LUMPECTOMY WITH BRACKETED RADIOACTIVE SEED AND SENTINEL LYMPH NODE BIOPSY, BLUE DYE INJECTION;  Surgeon: Donnie Mesa, MD;  Location: [facility];  Service: General;  Laterality: Right;  PEC BLOCK, BLUE DYE INJECTION  . CESAREAN SECTION     2011  . PORTACATH PLACEMENT Right 07/14/2020   Procedure: INSERTION PORT-A-CATH WITH ULTRASOUND GUIDANCE;  Surgeon: Donnie Mesa, MD;  Location: [place];  Service: General;  Laterality: Right;  . RE-EXCISION OF BREAST LUMPECTOMY Right 08/03/2020   Procedure: RE-EXCISION OF RIGHT BREAST LUMPECTOMY SUPERIOR MARGINS;  Surgeon: Donnie Mesa, MD;  Location: [place];  Service: General;  Laterality: Right;  LMA    There were no vitals filed for this visit.   Subjective Assessment - 11/01/20 0821    Subjective Pt returns for 3 month L-Dex screen.    Pertinent History Patient was diagnosed on 06/02/2020 with right grade II invasive ductal carcinoma breast  cancer. She underwent a right lumpectomy and sentinel node biopsy (1/1 with micromets) on 07/14/2020.  It is ER/PR positive and HER2 negative with a Ki67 of 5%.                  L-DEX FLOWSHEETS - 11/01/20 0800      L-DEX LYMPHEDEMA SCREENING   Measurement Type Unilateral    L-DEX MEASUREMENT EXTREMITY Upper Extremity    POSITION  Standing    DOMINANT SIDE Right    At Risk Side Right    BASELINE SCORE (UNILATERAL) -2.7    L-DEX SCORE (UNILATERAL) -2.8    VALUE CHANGE (UNILAT) -0.1                                  PT Long Term Goals - 08/09/20 1212      PT LONG TERM GOAL #1   Title Patient will demonstrate she has regained full shoulder ROM and function post operatively compared to baselines.    Time 8    Period Weeks    Status Achieved                 Plan - 11/01/20 0093    Clinical Impression Statement Pt returns for her 3 month L-Dex screen. Her chnage from baseline of -0.1 is WNLs so no further treatment is required at this time except to cont with every 3 month L-Dex screens which pt  is agreeable to.    PT Next Visit Plan Cont every 3 month L-Dex screens for up to 2 years from her SLNB.    Consulted and Agree with Plan of Care Patient           Patient will benefit from skilled therapeutic intervention in order to improve the following deficits and impairments:     Visit Diagnosis: Aftercare following surgery for neoplasm     Problem List Patient Active Problem List   Diagnosis Date Noted  . Port-A-Cath in place 10/15/2020  . Genetic testing 06/23/2020  . Family history of uterine cancer 06/16/2020  . Malignant neoplasm of upper-outer quadrant of right breast in female, estrogen receptor positive (HCC) 06/11/2020    Rosenberger, Valerie Ann, PTA 11/01/2020, 8:38 AM  [facility] [address] [place], [place], [address] Phone: [phone]   Fax:   [phone]  Name: Autumn Bullock MRN: [id number] Date of Birth: 06/26/1976   

## 2020-11-02 ENCOUNTER — Encounter: Payer: Self-pay | Admitting: *Deleted

## 2020-11-04 ENCOUNTER — Inpatient Hospital Stay: Payer: BC Managed Care – PPO | Attending: Oncology

## 2020-11-04 ENCOUNTER — Inpatient Hospital Stay: Payer: BC Managed Care – PPO

## 2020-11-04 ENCOUNTER — Other Ambulatory Visit: Payer: Self-pay

## 2020-11-04 VITALS — BP 106/71 | HR 77 | Temp 98.1°F | Resp 18

## 2020-11-04 DIAGNOSIS — Z170 Estrogen receptor positive status [ER+]: Secondary | ICD-10-CM | POA: Diagnosis not present

## 2020-11-04 DIAGNOSIS — C50411 Malignant neoplasm of upper-outer quadrant of right female breast: Secondary | ICD-10-CM

## 2020-11-04 DIAGNOSIS — D6481 Anemia due to antineoplastic chemotherapy: Secondary | ICD-10-CM | POA: Diagnosis not present

## 2020-11-04 DIAGNOSIS — Z1152 Encounter for screening for COVID-19: Secondary | ICD-10-CM | POA: Insufficient documentation

## 2020-11-04 DIAGNOSIS — Z5111 Encounter for antineoplastic chemotherapy: Secondary | ICD-10-CM | POA: Insufficient documentation

## 2020-11-04 DIAGNOSIS — Z95828 Presence of other vascular implants and grafts: Secondary | ICD-10-CM

## 2020-11-04 LAB — CBC WITH DIFFERENTIAL/PLATELET
Abs Immature Granulocytes: 0.02 10*3/uL (ref 0.00–0.07)
Basophils Absolute: 0 10*3/uL (ref 0.0–0.1)
Basophils Relative: 1 %
Eosinophils Absolute: 0.2 10*3/uL (ref 0.0–0.5)
Eosinophils Relative: 8 %
HCT: 25.6 % — ABNORMAL LOW (ref 36.0–46.0)
Hemoglobin: 9 g/dL — ABNORMAL LOW (ref 12.0–15.0)
Immature Granulocytes: 1 %
Lymphocytes Relative: 30 %
Lymphs Abs: 0.9 10*3/uL (ref 0.7–4.0)
MCH: 34 pg (ref 26.0–34.0)
MCHC: 35.2 g/dL (ref 30.0–36.0)
MCV: 96.6 fL (ref 80.0–100.0)
Monocytes Absolute: 0.2 10*3/uL (ref 0.1–1.0)
Monocytes Relative: 8 %
Neutro Abs: 1.6 10*3/uL — ABNORMAL LOW (ref 1.7–7.7)
Neutrophils Relative %: 52 %
Platelets: 226 10*3/uL (ref 150–400)
RBC: 2.65 MIL/uL — ABNORMAL LOW (ref 3.87–5.11)
RDW: 16.3 % — ABNORMAL HIGH (ref 11.5–15.5)
WBC: 2.9 10*3/uL — ABNORMAL LOW (ref 4.0–10.5)
nRBC: 0 % (ref 0.0–0.2)

## 2020-11-04 LAB — COMPREHENSIVE METABOLIC PANEL
ALT: 47 U/L — ABNORMAL HIGH (ref 0–44)
AST: 31 U/L (ref 15–41)
Albumin: 4.1 g/dL (ref 3.5–5.0)
Alkaline Phosphatase: 61 U/L (ref 38–126)
Anion gap: 8 (ref 5–15)
BUN: 14 mg/dL (ref 6–20)
CO2: 23 mmol/L (ref 22–32)
Calcium: 8.5 mg/dL — ABNORMAL LOW (ref 8.9–10.3)
Chloride: 107 mmol/L (ref 98–111)
Creatinine, Ser: 0.81 mg/dL (ref 0.44–1.00)
GFR, Estimated: >60 mL/min (ref >60)
Glucose, Bld: 105 mg/dL — ABNORMAL HIGH (ref 70–99)
Potassium: 3.9 mmol/L (ref 3.5–5.1)
Sodium: 138 mmol/L (ref 135–145)
Total Bilirubin: 0.4 mg/dL (ref 0.3–1.2)
Total Protein: 6.7 g/dL (ref 6.5–8.1)

## 2020-11-04 LAB — PREGNANCY, URINE: Preg Test, Ur: NEGATIVE

## 2020-11-04 MED ORDER — SODIUM CHLORIDE 0.9 % IV SOLN
Freq: Once | INTRAVENOUS | Status: AC
  Filled 2020-11-04: qty 250

## 2020-11-04 MED ORDER — DEXAMETHASONE SODIUM PHOSPHATE 10 MG/ML IJ SOLN
INTRAMUSCULAR | Status: AC
  Filled 2020-11-04: qty 1

## 2020-11-04 MED ORDER — DIPHENHYDRAMINE HCL 50 MG/ML IJ SOLN
INTRAMUSCULAR | Status: AC
  Filled 2020-11-04: qty 1

## 2020-11-04 MED ORDER — LORAZEPAM 2 MG/ML IJ SOLN
INTRAMUSCULAR | Status: AC
  Filled 2020-11-04: qty 1

## 2020-11-04 MED ORDER — FAMOTIDINE IN NACL 20-0.9 MG/50ML-% IV SOLN
INTRAVENOUS | Status: AC
  Filled 2020-11-04: qty 50

## 2020-11-04 MED ORDER — HEPARIN SOD (PORK) LOCK FLUSH 100 UNIT/ML IV SOLN
500.0000 [IU] | Freq: Once | INTRAVENOUS | Status: AC | PRN
  Administered 2020-11-04: 500 [IU]
  Filled 2020-11-04: qty 5

## 2020-11-04 MED ORDER — FAMOTIDINE IN NACL 20-0.9 MG/50ML-% IV SOLN
20.0000 mg | Freq: Once | INTRAVENOUS | Status: AC
  Administered 2020-11-04: 20 mg via INTRAVENOUS

## 2020-11-04 MED ORDER — DEXAMETHASONE SODIUM PHOSPHATE 10 MG/ML IJ SOLN
4.0000 mg | Freq: Once | INTRAMUSCULAR | Status: AC
  Administered 2020-11-04: 4 mg via INTRAVENOUS

## 2020-11-04 MED ORDER — SODIUM CHLORIDE 0.9% FLUSH
10.0000 mL | Freq: Once | INTRAVENOUS | Status: AC
Start: 2020-11-04 — End: 2020-11-04
  Administered 2020-11-04: 10 mL
  Filled 2020-11-04: qty 10

## 2020-11-04 MED ORDER — SODIUM CHLORIDE 0.9 % IV SOLN
80.0000 mg/m2 | Freq: Once | INTRAVENOUS | Status: AC
  Administered 2020-11-04: 144 mg via INTRAVENOUS
  Filled 2020-11-04: qty 24

## 2020-11-04 MED ORDER — DIPHENHYDRAMINE HCL 50 MG/ML IJ SOLN
25.0000 mg | Freq: Once | INTRAMUSCULAR | Status: AC
  Administered 2020-11-04: 25 mg via INTRAVENOUS

## 2020-11-04 MED ORDER — SODIUM CHLORIDE 0.9% FLUSH
10.0000 mL | INTRAVENOUS | Status: DC | PRN
  Administered 2020-11-04: 10 mL
  Filled 2020-11-04: qty 10

## 2020-11-04 MED ORDER — LORAZEPAM 2 MG/ML IJ SOLN
0.5000 mg | Freq: Once | INTRAMUSCULAR | Status: AC
  Administered 2020-11-04: 0.5 mg via INTRAVENOUS

## 2020-11-04 NOTE — Patient Instructions (Signed)
Implanted Port Insertion, Care After This sheet gives you information about how to care for yourself after your procedure. Your health care provider may also give you more specific instructions. If you have problems or questions, contact your health care provider. What can I expect after the procedure? After the procedure, it is common to have:  Discomfort at the port insertion site.  Bruising on the skin over the port. This should improve over 3-4 days. Follow these instructions at home: Port care  After your port is placed, you will get a manufacturer's information card. The card has information about your port. Keep this card with you at all times.  Take care of the port as told by your health care provider. Ask your health care provider if you or a family member can get training for taking care of the port at home. A home health care nurse may also take care of the port.  Make sure to remember what type of port you have. Incision care  Follow instructions from your health care provider about how to take care of your port insertion site. Make sure you: ? Wash your hands with soap and water before and after you change your bandage (dressing). If soap and water are not available, use hand sanitizer. ? Change your dressing as told by your health care provider. ? Leave stitches (sutures), skin glue, or adhesive strips in place. These skin closures may need to stay in place for 2 weeks or longer. If adhesive strip edges start to loosen and curl up, you may trim the loose edges. Do not remove adhesive strips completely unless your health care provider tells you to do that.  Check your port insertion site every day for signs of infection. Check for: ? Redness, swelling, or pain. ? Fluid or blood. ? Warmth. ? Pus or a bad smell.      Activity  Return to your normal activities as told by your health care provider. Ask your health care provider what activities are safe for you.  Do not  lift anything that is heavier than 10 lb (4.5 kg), or the limit that you are told, until your health care provider says that it is safe. General instructions  Take over-the-counter and prescription medicines only as told by your health care provider.  Do not take baths, swim, or use a hot tub until your health care provider approves. Ask your health care provider if you may take showers. You may only be allowed to take sponge baths.  Do not drive for 24 hours if you were given a sedative during your procedure.  Wear a medical alert bracelet in case of an emergency. This will tell any health care providers that you have a port.  Keep all follow-up visits as told by your health care provider. This is important. Contact a health care provider if:  You cannot flush your port with saline as directed, or you cannot draw blood from the port.  You have a fever or chills.  You have redness, swelling, or pain around your port insertion site.  You have fluid or blood coming from your port insertion site.  Your port insertion site feels warm to the touch.  You have pus or a bad smell coming from the port insertion site. Get help right away if:  You have chest pain or shortness of breath.  You have bleeding from your port that you cannot control. Summary  Take care of the port as told by your   health care provider. Keep the manufacturer's information card with you at all times.  Change your dressing as told by your health care provider.  Contact a health care provider if you have a fever or chills or if you have redness, swelling, or pain around your port insertion site.  Keep all follow-up visits as told by your health care provider. This information is not intended to replace advice given to you by your health care provider. Make sure you discuss any questions you have with your health care provider. Document Revised: 03/19/2018 Document Reviewed: 03/19/2018 Elsevier Patient Education   2021 Elsevier Inc.  

## 2020-11-04 NOTE — Patient Instructions (Signed)
[facility] Discharge Instructions for Patients Receiving Chemotherapy  Today you received the following chemotherapy agents: paclitaxel.  To help prevent nausea and vomiting after your treatment, we encourage you to take your nausea medication as directed.   If you develop nausea and vomiting that is not controlled by your nausea medication, call the clinic.   BELOW ARE SYMPTOMS THAT SHOULD BE REPORTED IMMEDIATELY:  *FEVER GREATER THAN 100.5 F  *CHILLS WITH OR WITHOUT FEVER  NAUSEA AND VOMITING THAT IS NOT CONTROLLED WITH YOUR NAUSEA MEDICATION  *UNUSUAL SHORTNESS OF BREATH  *UNUSUAL BRUISING OR BLEEDING  TENDERNESS IN MOUTH AND THROAT WITH OR WITHOUT PRESENCE OF ULCERS  *URINARY PROBLEMS  *BOWEL PROBLEMS  UNUSUAL RASH Items with * indicate a potential emergency and should be followed up as soon as possible.  Feel free to call the clinic should you have any questions or concerns. The clinic phone number is [phone].  Please show the CHEMO ALERT CARD at check-in to the Emergency Department and triage nurse.   

## 2020-11-05 ENCOUNTER — Ambulatory Visit: Payer: BC Managed Care – PPO

## 2020-11-05 ENCOUNTER — Encounter: Payer: Self-pay | Admitting: Oncology

## 2020-11-05 ENCOUNTER — Other Ambulatory Visit: Payer: BC Managed Care – PPO

## 2020-11-10 NOTE — Progress Notes (Signed)
[place]  [phone] [phone]     ID: MARCHE HOTTENSTEIN DOB: 02/04/1976  MR#: [id number]  VOZ#:[id number]  Patient Care Team: Brock Ra, PA-C as PCP - General Mauro Kaufmann, RN as Oncology Nurse Navigator Rockwell Germany, RN as Oncology Nurse Navigator Donnie Mesa, MD as Consulting Physician (General Surgery) Eppie Gibson, MD as Attending Physician (Radiation Oncology) Yailine Ballard, Virgie Dad, MD as Consulting Physician (Oncology) Vanessa Kick, MD as Consulting Physician (Obstetrics and Gynecology) Lovett Calender, MD as Consulting Physician (Orthopedic Surgery) Chauncey Cruel, MD OTHER MD:  CHIEF COMPLAINT: Estrogen receptor positive breast cancer  CURRENT TREATMENT: Adjuvant chemotherapy   INTERVAL HISTORY: Shahira returns today for follow up and treatment of her estrogen receptor positive breast cancer. She is accompanied by her mother  She continues on weekly Paclitaxel.  Today is week 5.  So far she is tolerating the treatment quite well, with no peripheral neuropathy, unusual fatigue, or other side effects that she is aware of   REVIEW OF SYSTEMS: Leiya will be returning back to work beginning 11/15/2020.  She is very excited about this.  She of course is somewhat anxious regarding her stamina at this point.  She is having some allergies which are seasonal.  She is looking forward to an [place] time vacation with her family (her daughter and she will visit Alyssa's brother in [place]).  Aside from these issues a detailed review of systems today was stable   COVID 19 VACCINATION STATUS: Status post [place] x2, most recent treatment April 2021.  Patient also was diagnosed with COVID-19 disease November 2020   HISTORY OF CURRENT ILLNESS: From the original intake note:  Eilish had routine screening mammography on 06/02/2020 showing a possible abnormality in the right breast. She underwent right diagnostic mammography with  tomography and right breast ultrasonography at [facility] on 06/08/2020 showing: breast density category C; 2.3 cm right breast mass at 11:30; 0.8 cm mass superficial to dominant mass; no right axillary adenopathy.  Accordingly on 06/08/2020 she proceeded to biopsy of the right breast area in question. The pathology from this procedure ([id number]) showed: invasive and in situ mammary carcinoma, grade 2, e-cadherin positive. Both biopsied masses showed this and were found to be morphologically similar. Prognostic indicators significant for: estrogen receptor, 90% positive and progesterone receptor, 70% positive, both with strong staining intensity. Proliferation marker Ki67 at 5%. HER2 negative by immunohistochemistry (1+).  The patient's subsequent history is as detailed below.   PAST MEDICAL HISTORY: Past Medical History:  Diagnosis Date  . Family history of uterine cancer 06/16/2020  . PONV (postoperative nausea and vomiting)     PAST SURGICAL HISTORY: Past Surgical History:  Procedure Laterality Date  . BREAST LUMPECTOMY WITH RADIOACTIVE SEED AND SENTINEL LYMPH NODE BIOPSY Right 07/14/2020   Procedure: RIGHT BREAST LUMPECTOMY WITH BRACKETED RADIOACTIVE SEED AND SENTINEL LYMPH NODE BIOPSY, BLUE DYE INJECTION;  Surgeon: Donnie Mesa, MD;  Location: [facility];  Service: General;  Laterality: Right;  PEC BLOCK, BLUE DYE INJECTION  . CESAREAN SECTION     2011  . PORTACATH PLACEMENT Right 07/14/2020   Procedure: INSERTION PORT-A-CATH WITH ULTRASOUND GUIDANCE;  Surgeon: Donnie Mesa, MD;  Location: [facility];  Service: General;  Laterality: Right;  . RE-EXCISION OF BREAST LUMPECTOMY Right 08/03/2020   Procedure: RE-EXCISION OF RIGHT BREAST LUMPECTOMY SUPERIOR MARGINS;  Surgeon: Donnie Mesa, MD;  Location: [facility];  Service: General;  Laterality: Right;  LMA  FAMILY HISTORY: Family History  Problem Relation Age of Onset  .  Cancer Maternal Grandmother 86       Unknown GYN.  ?Uterine  As of October 2021 her parents are both living, her father at age 33 and her mother at 55, . Santiaga has 1 brother and 2 sisters. She reports uterine cancer in her maternal grandmother at age 78. There is no family history of breast, ovarian, or colon cancer to her knowledge.   GYNECOLOGIC HISTORY:  No LMP recorded. Menarche: 45 years old Age at first live birth: 45 years old [place] P 1 LMP 05/05/2020, regular monthly periods, lasting 3 days with 1-2 heavy days Contraceptive: has used for 15 years, no complications HRT n/a  Hysterectomy? no BSO? 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 Husband [name] works in Nature conservation officer. She lives at home with [name] and their daughter [name], age 66. She attends [employer] in [place].    ADVANCED DIRECTIVES: In the absence of any documentation to the contrary, the patient's spouse is their HCPOA.    HEALTH MAINTENANCE: Social History   Tobacco Use  . Smoking status: Never Smoker  . Smokeless tobacco: Never Used  Substance Use Topics  . Alcohol use: Yes    Comment: 7/week  . Drug use: Never     Colonoscopy: 1999?  PAP: 05/31/2020  Bone density: never done   Allergies  Allergen Reactions  . Wound Dressing Adhesive     Tape etc     Current Outpatient Medications  Medication Sig Dispense Refill  . docusate sodium (COLACE) 100 MG capsule Take 2 capsules (200 mg total) by mouth 2 (two) times daily. (Patient not taking: Reported on 10/22/2020) 80 capsule 0  . lidocaine-prilocaine (EMLA) cream Apply to affected area once 30 g 3  . metroNIDAZOLE (METROGEL) 1 % gel Apply topically daily. (Patient not taking: Reported on 10/22/2020) 45 g 0  . omeprazole (PRILOSEC) 40 MG capsule Take 1 capsule (40 mg total) by mouth at bedtime. (Patient not taking: Reported on 10/22/2020) 90 capsule 4  . ondansetron (ZOFRAN ODT)  4 MG disintegrating tablet Take 1 tablet (4 mg total) by mouth every 8 (eight) hours as needed for nausea or vomiting. (Patient not taking: Reported on 10/22/2020) 20 tablet 2  . prochlorperazine (COMPAZINE) 10 MG tablet Take 1 tablet (10 mg total) by mouth every 6 (six) hours as needed (Nausea or vomiting). (Patient not taking: Reported on 10/22/2020) 30 tablet 1   No current facility-administered medications for this visit.   Facility-Administered Medications Ordered in Other Visits  Medication Dose Route Frequency Provider Last Rate Last Admin  . diphenhydrAMINE (BENADRYL) injection 25 mg  25 mg Intravenous Once Kenniya Westrich, Virgie Dad, MD      . heparin lock flush 100 unit/mL  500 Units Intracatheter Once PRN Blayn Whetsell, Virgie Dad, MD      . PACLitaxel (TAXOL) 144 mg in sodium chloride 0.9 % 250 mL chemo infusion (</= 65m/m2)  80 mg/m2 (Treatment Plan Recorded) Intravenous Once MChauncey Cruel MD 274 mL/hr at 11/11/20 1607 144 mg at 11/11/20 1607  . sodium chloride flush (NS) 0.9 % injection 10 mL  10 mL Intracatheter PRN Armando Bukhari, GVirgie Dad MD        OBJECTIVE: White woman in no acute distress  Vitals:   11/11/20 1330  BP: (!) 106/59  Pulse: 73  Resp: 18  Temp: 97.7 F (36.5 C)  SpO2: 100%  Body mass index is 27.66 kg/m.   Wt Readings from Last 3 Encounters:  11/11/20 166 lb 3.2 oz (75.4 kg)  10/28/20 164 lb 4.8 oz (74.5 kg)  10/22/20 162 lb 4.8 oz (73.6 kg)     ECOG FS:1 - Symptomatic but completely ambulatory  Sclerae unicteric, EOMs intact Wearing a mask No cervical or supraclavicular adenopathy Lungs no rales or rhonchi Heart regular rate and rhythm Abd soft, nontender, positive bowel sounds MSK no focal spinal tenderness, no upper extremity lymphedema Neuro: nonfocal, well oriented, appropriate affect Breasts: Deferred   LAB RESULTS:  CMP     Component Value Date/Time   NA 140 11/11/2020 1307   K 3.8 11/11/2020 1307   CL 108 11/11/2020 1307   CO2 25  11/11/2020 1307   GLUCOSE 109 (H) 11/11/2020 1307   BUN 11 11/11/2020 1307   CREATININE 0.73 11/11/2020 1307   CREATININE 0.73 08/17/2020 0905   CALCIUM 8.5 (L) 11/11/2020 1307   PROT 6.9 11/11/2020 1307   ALBUMIN 4.2 11/11/2020 1307   AST 33 11/11/2020 1307   AST 18 08/17/2020 0905   ALT 51 (H) 11/11/2020 1307   ALT 18 08/17/2020 0905   ALKPHOS 67 11/11/2020 1307   BILITOT 0.4 11/11/2020 1307   BILITOT 0.7 08/17/2020 0905   GFRNONAA >60 11/11/2020 1307   GFRNONAA >60 08/17/2020 0905    No results found for: TOTALPROTELP, ALBUMINELP, A1GS, A2GS, BETS, BETA2SER, GAMS, MSPIKE, SPEI  Lab Results  Component Value Date   WBC 2.3 (L) 11/11/2020   NEUTROABS 1.3 (L) 11/11/2020   HGB 9.4 (L) 11/11/2020   HCT 26.9 (L) 11/11/2020   MCV 97.1 11/11/2020   PLT 271 11/11/2020    No results found for: LABCA2  No components found for: TMLYYT035  No results for input(s): INR in the last 168 hours.  No results found for: LABCA2  No results found for: WSF681  No results found for: EXN170  No results found for: YFV494  No results found for: CA2729  No components found for: HGQUANT  No results found for: CEA1 / No results found for: CEA1   No results found for: AFPTUMOR  No results found for: CHROMOGRNA  No results found for: KPAFRELGTCHN, LAMBDASER, KAPLAMBRATIO (kappa/lambda light chains)  No results found for: HGBA, HGBA2QUANT, HGBFQUANT, HGBSQUAN (Hemoglobinopathy evaluation)   No results found for: LDH  No results found for: IRON, TIBC, IRONPCTSAT (Iron and TIBC)  No results found for: FERRITIN  Urinalysis    Component Value Date/Time   COLORURINE YELLOW 09/02/2020 0843   APPEARANCEUR HAZY (A) 09/02/2020 0843   LABSPEC 1.010 09/02/2020 0843   PHURINE 6.0 09/02/2020 0843   GLUCOSEU NEGATIVE 09/02/2020 0843   HGBUR SMALL (A) 09/02/2020 0843   BILIRUBINUR NEGATIVE 09/02/2020 0843   KETONESUR NEGATIVE 09/02/2020 0843   PROTEINUR NEGATIVE 09/02/2020 0843    NITRITE NEGATIVE 09/02/2020 0843   LEUKOCYTESUR TRACE (A) 09/02/2020 0843    STUDIES: No results found.   ELIGIBLE FOR AVAILABLE RESEARCH PROTOCOL: AET  ASSESSMENT: 45 y.o. [place] woman status post right breast upper outer quadrant biopsy 06/08/2020 for a clinical mT2 N0, stage IB invasive ductal carcinoma, grade 2, estrogen and progesterone receptor positive, HER-2 not amplified, with an MIB-1 of 5%.  (1) genetics testing 06/22/2020 through the Common Hereditary Cancers Panel offered by [facility] found no deleterious mutations in APC, ATM, AXIN2, BARD1, BMPR1A, BRCA1, BRCA2, BRIP1, CDH1, CDK4, CDKN2A (p14ARF), CDKN2A (p16INK4a), CHEK2, CTNNA1, DICER1, EPCAM (Deletion/duplication testing only), GREM1 (promoter region deletion/duplication testing  only), KIT, MEN1, MLH1, MSH2, MSH3, MSH6, MUTYH, NBN, NF1, NHTL1, PALB2, PDGFRA, PMS2, POLD1, POLE, PTEN, RAD50, RAD51C, RAD51D, RNF43, SDHB, SDHC, SDHD, SMAD4, SMARCA4. STK11, TP53, TSC1, TSC2, and VHL.  The following genes were evaluated for sequence changes only: SDHA and HOXB13 c.251G>A variant only.  (a)  a variant of uncertain significance in MSH3 at c.2731T>G (p.Leu911Val).   (2) right lumpectomy and sentinel lymph node sampling 07/14/2020 showed a pT2 pN1(mic), stage IIA invasive ductal carcinoma, grade 2, with a positive superior margin  (a) additional surgery 08/03/2020  (3) MammaPrint obtained from the initial biopsy showed high risk, predicting a 93% 5-year disease-free survival with chemotherapy and significant benefit from chemotherapy (greater than 12%).  (4) cyclophosphamide and doxorubicin in dose dense fashion x4 started 10/15/2020 08/17/2020, completed 09/28/2020, followed by paclitaxel weekly x12 started  (a) echo 07/09/2020 shows an ejection fraction in the 60-65% range.  (5) adjuvant radiation to follow  (6) antiestrogens to start at the completion of local treatment   PLAN: Zoella is still moderately anemic from her  chemotherapy but there has been no further decline in her hemoglobin.  She has a good energy level and is anxious to get back to school.  We have discussed Covid precautions and what she can do to remain safe but her immune system at this point should not be severely suppressed.  She has had no peripheral neuropathy issues so far and we are continuing paclitaxel weekly.  On 12/23/2020 she will be just returning from her [place] break so that particular a week she will be treated on a Friday.  Otherwise we have set her up for treatments on Thursday through her final dose on 12/30/2020  I am seeing her every other visit but she knows to let me know if any unusual symptoms develop in between evaluations.  Total encounter time 25 minutes.Sarajane Jews C. Mayra Jolliffe, MD 11/11/20 4:40 PM Medical Oncology and Hematology [facility] [place], [place] [address] Tel. [phone]    Fax. [phone]   I, Wilburn Mylar, am acting as scribe for Dr. Virgie Dad. Skii Cleland.  I, Lurline Del MD, have reviewed the above documentation for accuracy and completeness, and I agree with the above.   *Total Encounter Time as defined by the Centers for Medicare and Medicaid Services includes, in addition to the face-to-face time of a patient visit (documented in the note above) non-face-to-face time: obtaining and reviewing outside history, ordering and reviewing medications, tests or procedures, care coordination (communications with other health care professionals or caregivers) and documentation in the medical record.

## 2020-11-11 ENCOUNTER — Inpatient Hospital Stay: Payer: BC Managed Care – PPO

## 2020-11-11 ENCOUNTER — Other Ambulatory Visit: Payer: Self-pay

## 2020-11-11 ENCOUNTER — Inpatient Hospital Stay (HOSPITAL_BASED_OUTPATIENT_CLINIC_OR_DEPARTMENT_OTHER): Payer: BC Managed Care – PPO | Admitting: Oncology

## 2020-11-11 VITALS — BP 106/59 | HR 73 | Temp 97.7°F | Resp 18 | Ht 65.0 in | Wt 166.2 lb

## 2020-11-11 DIAGNOSIS — Z5111 Encounter for antineoplastic chemotherapy: Secondary | ICD-10-CM | POA: Diagnosis not present

## 2020-11-11 DIAGNOSIS — Z170 Estrogen receptor positive status [ER+]: Secondary | ICD-10-CM

## 2020-11-11 DIAGNOSIS — C50411 Malignant neoplasm of upper-outer quadrant of right female breast: Secondary | ICD-10-CM

## 2020-11-11 DIAGNOSIS — Z95828 Presence of other vascular implants and grafts: Secondary | ICD-10-CM

## 2020-11-11 LAB — CBC WITH DIFFERENTIAL/PLATELET
Abs Immature Granulocytes: 0.01 10*3/uL (ref 0.00–0.07)
Basophils Absolute: 0 10*3/uL (ref 0.0–0.1)
Basophils Relative: 1 %
Eosinophils Absolute: 0.1 10*3/uL (ref 0.0–0.5)
Eosinophils Relative: 3 %
HCT: 26.9 % — ABNORMAL LOW (ref 36.0–46.0)
Hemoglobin: 9.4 g/dL — ABNORMAL LOW (ref 12.0–15.0)
Immature Granulocytes: 0 %
Lymphocytes Relative: 31 %
Lymphs Abs: 0.7 10*3/uL (ref 0.7–4.0)
MCH: 33.9 pg (ref 26.0–34.0)
MCHC: 34.9 g/dL (ref 30.0–36.0)
MCV: 97.1 fL (ref 80.0–100.0)
Monocytes Absolute: 0.2 10*3/uL (ref 0.1–1.0)
Monocytes Relative: 8 %
Neutro Abs: 1.3 10*3/uL — ABNORMAL LOW (ref 1.7–7.7)
Neutrophils Relative %: 57 %
Platelets: 271 10*3/uL (ref 150–400)
RBC: 2.77 MIL/uL — ABNORMAL LOW (ref 3.87–5.11)
RDW: 16 % — ABNORMAL HIGH (ref 11.5–15.5)
WBC: 2.3 10*3/uL — ABNORMAL LOW (ref 4.0–10.5)
nRBC: 0 % (ref 0.0–0.2)

## 2020-11-11 LAB — COMPREHENSIVE METABOLIC PANEL
ALT: 51 U/L — ABNORMAL HIGH (ref 0–44)
AST: 33 U/L (ref 15–41)
Albumin: 4.2 g/dL (ref 3.5–5.0)
Alkaline Phosphatase: 67 U/L (ref 38–126)
Anion gap: 7 (ref 5–15)
BUN: 11 mg/dL (ref 6–20)
CO2: 25 mmol/L (ref 22–32)
Calcium: 8.5 mg/dL — ABNORMAL LOW (ref 8.9–10.3)
Chloride: 108 mmol/L (ref 98–111)
Creatinine, Ser: 0.73 mg/dL (ref 0.44–1.00)
GFR, Estimated: >60 mL/min (ref >60)
Glucose, Bld: 109 mg/dL — ABNORMAL HIGH (ref 70–99)
Potassium: 3.8 mmol/L (ref 3.5–5.1)
Sodium: 140 mmol/L (ref 135–145)
Total Bilirubin: 0.4 mg/dL (ref 0.3–1.2)
Total Protein: 6.9 g/dL (ref 6.5–8.1)

## 2020-11-11 LAB — PREGNANCY, URINE: Preg Test, Ur: NEGATIVE

## 2020-11-11 MED ORDER — FAMOTIDINE IN NACL 20-0.9 MG/50ML-% IV SOLN
20.0000 mg | Freq: Once | INTRAVENOUS | Status: AC
  Administered 2020-11-11: 20 mg via INTRAVENOUS

## 2020-11-11 MED ORDER — SODIUM CHLORIDE 0.9 % IV SOLN
80.0000 mg/m2 | Freq: Once | INTRAVENOUS | Status: AC
  Administered 2020-11-11: 144 mg via INTRAVENOUS
  Filled 2020-11-11: qty 24

## 2020-11-11 MED ORDER — SODIUM CHLORIDE 0.9% FLUSH
10.0000 mL | INTRAVENOUS | Status: DC | PRN
  Administered 2020-11-11: 10 mL
  Filled 2020-11-11: qty 10

## 2020-11-11 MED ORDER — HEPARIN SOD (PORK) LOCK FLUSH 100 UNIT/ML IV SOLN
500.0000 [IU] | Freq: Once | INTRAVENOUS | Status: AC | PRN
Start: 2020-11-11 — End: 2020-11-11
  Administered 2020-11-11: 500 [IU]
  Filled 2020-11-11: qty 5

## 2020-11-11 MED ORDER — DIPHENHYDRAMINE HCL 50 MG/ML IJ SOLN: 25.0000 mg | Freq: Once | INTRAMUSCULAR | Status: DC

## 2020-11-11 MED ORDER — DIPHENHYDRAMINE HCL 50 MG/ML IJ SOLN
INTRAMUSCULAR | Status: AC
  Filled 2020-11-11: qty 1

## 2020-11-11 MED ORDER — DEXAMETHASONE SODIUM PHOSPHATE 10 MG/ML IJ SOLN
4.0000 mg | Freq: Once | INTRAMUSCULAR | Status: AC
Start: 2020-11-11 — End: 2020-11-11
  Administered 2020-11-11: 4 mg via INTRAVENOUS

## 2020-11-11 MED ORDER — SODIUM CHLORIDE 0.9% FLUSH
10.0000 mL | Freq: Once | INTRAVENOUS | Status: AC
  Administered 2020-11-11: 10 mL
  Filled 2020-11-11: qty 10

## 2020-11-11 MED ORDER — LORAZEPAM 2 MG/ML IJ SOLN
INTRAMUSCULAR | Status: AC
  Filled 2020-11-11: qty 1

## 2020-11-11 MED ORDER — LORAZEPAM 2 MG/ML IJ SOLN
0.5000 mg | Freq: Once | INTRAMUSCULAR | Status: AC
  Administered 2020-11-11: 0.5 mg via INTRAVENOUS

## 2020-11-11 MED ORDER — FAMOTIDINE IN NACL 20-0.9 MG/50ML-% IV SOLN
INTRAVENOUS | Status: AC
  Filled 2020-11-11: qty 50

## 2020-11-11 MED ORDER — DEXAMETHASONE SODIUM PHOSPHATE 10 MG/ML IJ SOLN
INTRAMUSCULAR | Status: AC
  Filled 2020-11-11: qty 1

## 2020-11-11 MED ORDER — SODIUM CHLORIDE 0.9 % IV SOLN
Freq: Once | INTRAVENOUS | Status: AC
  Filled 2020-11-11: qty 250

## 2020-11-11 NOTE — Progress Notes (Signed)
Per Dr Jana Hakim pt is ok to treat with ANC of 1.3

## 2020-11-11 NOTE — Patient Instructions (Signed)
[facility] Discharge Instructions for Patients Receiving Chemotherapy  Today you received the following chemotherapy agents: paclitaxel.  To help prevent nausea and vomiting after your treatment, we encourage you to take your nausea medication as directed.   If you develop nausea and vomiting that is not controlled by your nausea medication, call the clinic.   BELOW ARE SYMPTOMS THAT SHOULD BE REPORTED IMMEDIATELY:  *FEVER GREATER THAN 100.5 F  *CHILLS WITH OR WITHOUT FEVER  NAUSEA AND VOMITING THAT IS NOT CONTROLLED WITH YOUR NAUSEA MEDICATION  *UNUSUAL SHORTNESS OF BREATH  *UNUSUAL BRUISING OR BLEEDING  TENDERNESS IN MOUTH AND THROAT WITH OR WITHOUT PRESENCE OF ULCERS  *URINARY PROBLEMS  *BOWEL PROBLEMS  UNUSUAL RASH Items with * indicate a potential emergency and should be followed up as soon as possible.  Feel free to call the clinic should you have any questions or concerns. The clinic phone number is [phone].  Please show the CHEMO ALERT CARD at check-in to the Emergency Department and triage nurse.   

## 2020-11-11 NOTE — Patient Instructions (Signed)

## 2020-11-11 NOTE — Progress Notes (Signed)
Per Dr. Jana Hakim, ok to treat with ANC 1.3

## 2020-11-12 ENCOUNTER — Encounter: Payer: Self-pay | Admitting: Oncology

## 2020-11-15 ENCOUNTER — Other Ambulatory Visit: Payer: Self-pay | Admitting: Oncology

## 2020-11-18 ENCOUNTER — Inpatient Hospital Stay: Payer: BC Managed Care – PPO

## 2020-11-18 ENCOUNTER — Other Ambulatory Visit: Payer: Self-pay

## 2020-11-18 VITALS — BP 115/71 | HR 63 | Temp 98.1°F | Resp 18

## 2020-11-18 DIAGNOSIS — C50411 Malignant neoplasm of upper-outer quadrant of right female breast: Secondary | ICD-10-CM

## 2020-11-18 DIAGNOSIS — Z170 Estrogen receptor positive status [ER+]: Secondary | ICD-10-CM

## 2020-11-18 DIAGNOSIS — Z95828 Presence of other vascular implants and grafts: Secondary | ICD-10-CM

## 2020-11-18 DIAGNOSIS — Z5111 Encounter for antineoplastic chemotherapy: Secondary | ICD-10-CM | POA: Diagnosis not present

## 2020-11-18 LAB — CBC WITH DIFFERENTIAL/PLATELET
Abs Immature Granulocytes: 0.03 10*3/uL (ref 0.00–0.07)
Basophils Absolute: 0 10*3/uL (ref 0.0–0.1)
Basophils Relative: 1 %
Eosinophils Absolute: 0 10*3/uL (ref 0.0–0.5)
Eosinophils Relative: 2 %
HCT: 25.9 % — ABNORMAL LOW (ref 36.0–46.0)
Hemoglobin: 9 g/dL — ABNORMAL LOW (ref 12.0–15.0)
Immature Granulocytes: 1 %
Lymphocytes Relative: 42 %
Lymphs Abs: 1 10*3/uL (ref 0.7–4.0)
MCH: 34.5 pg — ABNORMAL HIGH (ref 26.0–34.0)
MCHC: 34.7 g/dL (ref 30.0–36.0)
MCV: 99.2 fL (ref 80.0–100.0)
Monocytes Absolute: 0.2 10*3/uL (ref 0.1–1.0)
Monocytes Relative: 10 %
Neutro Abs: 1 10*3/uL — ABNORMAL LOW (ref 1.7–7.7)
Neutrophils Relative %: 44 %
Platelets: 320 10*3/uL (ref 150–400)
RBC: 2.61 MIL/uL — ABNORMAL LOW (ref 3.87–5.11)
RDW: 15.8 % — ABNORMAL HIGH (ref 11.5–15.5)
WBC: 2.3 10*3/uL — ABNORMAL LOW (ref 4.0–10.5)
nRBC: 0 % (ref 0.0–0.2)

## 2020-11-18 LAB — COMPREHENSIVE METABOLIC PANEL
ALT: 41 U/L (ref 0–44)
AST: 29 U/L (ref 15–41)
Albumin: 4.3 g/dL (ref 3.5–5.0)
Alkaline Phosphatase: 72 U/L (ref 38–126)
Anion gap: 8 (ref 5–15)
BUN: 15 mg/dL (ref 6–20)
CO2: 24 mmol/L (ref 22–32)
Calcium: 9 mg/dL (ref 8.9–10.3)
Chloride: 108 mmol/L (ref 98–111)
Creatinine, Ser: 0.82 mg/dL (ref 0.44–1.00)
GFR, Estimated: >60 mL/min (ref >60)
Glucose, Bld: 112 mg/dL — ABNORMAL HIGH (ref 70–99)
Potassium: 3.8 mmol/L (ref 3.5–5.1)
Sodium: 140 mmol/L (ref 135–145)
Total Bilirubin: 0.3 mg/dL (ref 0.3–1.2)
Total Protein: 7.1 g/dL (ref 6.5–8.1)

## 2020-11-18 LAB — PREGNANCY, URINE: Preg Test, Ur: NEGATIVE

## 2020-11-18 MED ORDER — FAMOTIDINE IN NACL 20-0.9 MG/50ML-% IV SOLN
INTRAVENOUS | Status: AC
  Filled 2020-11-18: qty 50

## 2020-11-18 MED ORDER — DEXAMETHASONE SODIUM PHOSPHATE 10 MG/ML IJ SOLN
4.0000 mg | Freq: Once | INTRAMUSCULAR | Status: AC
  Administered 2020-11-18: 4 mg via INTRAVENOUS

## 2020-11-18 MED ORDER — DEXAMETHASONE SODIUM PHOSPHATE 10 MG/ML IJ SOLN
INTRAMUSCULAR | Status: AC
  Filled 2020-11-18: qty 1

## 2020-11-18 MED ORDER — LORAZEPAM 2 MG/ML IJ SOLN
0.5000 mg | Freq: Once | INTRAMUSCULAR | Status: AC
  Administered 2020-11-18: 0.5 mg via INTRAVENOUS

## 2020-11-18 MED ORDER — SODIUM CHLORIDE 0.9 % IV SOLN
Freq: Once | INTRAVENOUS | Status: AC
  Filled 2020-11-18: qty 250

## 2020-11-18 MED ORDER — SODIUM CHLORIDE 0.9% FLUSH
10.0000 mL | Freq: Once | INTRAVENOUS | Status: AC
  Administered 2020-11-18: 10 mL
  Filled 2020-11-18: qty 10

## 2020-11-18 MED ORDER — LORAZEPAM 2 MG/ML IJ SOLN
INTRAMUSCULAR | Status: AC
  Filled 2020-11-18: qty 1

## 2020-11-18 MED ORDER — SODIUM CHLORIDE 0.9% FLUSH
10.0000 mL | INTRAVENOUS | Status: DC | PRN
  Administered 2020-11-18: 10 mL
  Filled 2020-11-18: qty 10

## 2020-11-18 MED ORDER — HEPARIN SOD (PORK) LOCK FLUSH 100 UNIT/ML IV SOLN
500.0000 [IU] | Freq: Once | INTRAVENOUS | Status: AC | PRN
  Administered 2020-11-18: 500 [IU]
  Filled 2020-11-18: qty 5

## 2020-11-18 MED ORDER — SODIUM CHLORIDE 0.9 % IV SOLN
80.0000 mg/m2 | Freq: Once | INTRAVENOUS | Status: AC
  Administered 2020-11-18: 144 mg via INTRAVENOUS
  Filled 2020-11-18: qty 24

## 2020-11-18 MED ORDER — FAMOTIDINE IN NACL 20-0.9 MG/50ML-% IV SOLN
20.0000 mg | Freq: Once | INTRAVENOUS | Status: AC
  Administered 2020-11-18: 20 mg via INTRAVENOUS

## 2020-11-18 NOTE — Patient Instructions (Signed)
[facility] Discharge Instructions for Patients Receiving Chemotherapy  Today you received the following chemotherapy agents Paclitaxel  To help prevent nausea and vomiting after your treatment, we encourage you to take your nausea medication as directed   If you develop nausea and vomiting that is not controlled by your nausea medication, call the clinic.   BELOW ARE SYMPTOMS THAT SHOULD BE REPORTED IMMEDIATELY:  *FEVER GREATER THAN 100.5 F  *CHILLS WITH OR WITHOUT FEVER  NAUSEA AND VOMITING THAT IS NOT CONTROLLED WITH YOUR NAUSEA MEDICATION  *UNUSUAL SHORTNESS OF BREATH  *UNUSUAL BRUISING OR BLEEDING  TENDERNESS IN MOUTH AND THROAT WITH OR WITHOUT PRESENCE OF ULCERS  *URINARY PROBLEMS  *BOWEL PROBLEMS  UNUSUAL RASH Items with * indicate a potential emergency and should be followed up as soon as possible.  Feel free to call the clinic should you have any questions or concerns. The clinic phone number is [phone].  Please show the CHEMO ALERT CARD at check-in to the Emergency Department and triage nurse.   

## 2020-11-18 NOTE — Patient Instructions (Signed)

## 2020-11-18 NOTE — Progress Notes (Signed)
Per Dr. Jana Hakim, okay for patient to receive treatment today with ANC 1.0.

## 2020-11-19 ENCOUNTER — Encounter: Payer: Self-pay | Admitting: Oncology

## 2020-11-22 ENCOUNTER — Other Ambulatory Visit: Payer: Self-pay | Admitting: Oncology

## 2020-11-22 ENCOUNTER — Encounter: Payer: Self-pay | Admitting: Oncology

## 2020-11-25 ENCOUNTER — Encounter: Payer: Self-pay | Admitting: Adult Health

## 2020-11-25 ENCOUNTER — Other Ambulatory Visit: Payer: Self-pay

## 2020-11-25 ENCOUNTER — Inpatient Hospital Stay: Payer: BC Managed Care – PPO

## 2020-11-25 ENCOUNTER — Inpatient Hospital Stay: Payer: BC Managed Care – PPO | Admitting: Adult Health

## 2020-11-25 ENCOUNTER — Encounter: Payer: Self-pay | Admitting: *Deleted

## 2020-11-25 VITALS — BP 114/64 | HR 87 | Temp 97.4°F | Resp 18 | Ht 65.0 in | Wt 165.2 lb

## 2020-11-25 DIAGNOSIS — Z170 Estrogen receptor positive status [ER+]: Secondary | ICD-10-CM

## 2020-11-25 DIAGNOSIS — Z95828 Presence of other vascular implants and grafts: Secondary | ICD-10-CM

## 2020-11-25 DIAGNOSIS — C50411 Malignant neoplasm of upper-outer quadrant of right female breast: Secondary | ICD-10-CM

## 2020-11-25 DIAGNOSIS — Z5111 Encounter for antineoplastic chemotherapy: Secondary | ICD-10-CM | POA: Diagnosis not present

## 2020-11-25 LAB — CBC WITH DIFFERENTIAL/PLATELET
Abs Immature Granulocytes: 0.03 10*3/uL (ref 0.00–0.07)
Basophils Absolute: 0 10*3/uL (ref 0.0–0.1)
Basophils Relative: 1 %
Eosinophils Absolute: 0 10*3/uL (ref 0.0–0.5)
Eosinophils Relative: 1 %
HCT: 27.4 % — ABNORMAL LOW (ref 36.0–46.0)
Hemoglobin: 9.6 g/dL — ABNORMAL LOW (ref 12.0–15.0)
Immature Granulocytes: 1 %
Lymphocytes Relative: 19 %
Lymphs Abs: 0.6 10*3/uL — ABNORMAL LOW (ref 0.7–4.0)
MCH: 34.7 pg — ABNORMAL HIGH (ref 26.0–34.0)
MCHC: 35 g/dL (ref 30.0–36.0)
MCV: 98.9 fL (ref 80.0–100.0)
Monocytes Absolute: 0.3 10*3/uL (ref 0.1–1.0)
Monocytes Relative: 10 %
Neutro Abs: 2.3 10*3/uL (ref 1.7–7.7)
Neutrophils Relative %: 68 %
Platelets: 334 10*3/uL (ref 150–400)
RBC: 2.77 MIL/uL — ABNORMAL LOW (ref 3.87–5.11)
RDW: 15 % (ref 11.5–15.5)
WBC: 3.3 10*3/uL — ABNORMAL LOW (ref 4.0–10.5)
nRBC: 0 % (ref 0.0–0.2)

## 2020-11-25 LAB — COMPREHENSIVE METABOLIC PANEL
ALT: 45 U/L — ABNORMAL HIGH (ref 0–44)
AST: 33 U/L (ref 15–41)
Albumin: 4.4 g/dL (ref 3.5–5.0)
Alkaline Phosphatase: 59 U/L (ref 38–126)
Anion gap: 9 (ref 5–15)
BUN: 16 mg/dL (ref 6–20)
CO2: 24 mmol/L (ref 22–32)
Calcium: 8.9 mg/dL (ref 8.9–10.3)
Chloride: 106 mmol/L (ref 98–111)
Creatinine, Ser: 0.79 mg/dL (ref 0.44–1.00)
GFR, Estimated: >60 mL/min (ref >60)
Glucose, Bld: 108 mg/dL — ABNORMAL HIGH (ref 70–99)
Potassium: 3.9 mmol/L (ref 3.5–5.1)
Sodium: 139 mmol/L (ref 135–145)
Total Bilirubin: 0.4 mg/dL (ref 0.3–1.2)
Total Protein: 7.2 g/dL (ref 6.5–8.1)

## 2020-11-25 LAB — PREGNANCY, URINE: Preg Test, Ur: NEGATIVE

## 2020-11-25 MED ORDER — SODIUM CHLORIDE 0.9% FLUSH
10.0000 mL | INTRAVENOUS | Status: DC | PRN
  Administered 2020-11-25: 10 mL
  Filled 2020-11-25: qty 10

## 2020-11-25 MED ORDER — SODIUM CHLORIDE 0.9 % IV SOLN
80.0000 mg/m2 | Freq: Once | INTRAVENOUS | Status: AC
  Administered 2020-11-25: 144 mg via INTRAVENOUS
  Filled 2020-11-25: qty 24

## 2020-11-25 MED ORDER — FAMOTIDINE IN NACL 20-0.9 MG/50ML-% IV SOLN
INTRAVENOUS | Status: AC
  Filled 2020-11-25: qty 50

## 2020-11-25 MED ORDER — SODIUM CHLORIDE 0.9 % IV SOLN
Freq: Once | INTRAVENOUS | Status: AC
  Filled 2020-11-25: qty 250

## 2020-11-25 MED ORDER — DEXAMETHASONE SODIUM PHOSPHATE 10 MG/ML IJ SOLN
4.0000 mg | Freq: Once | INTRAMUSCULAR | Status: AC
Start: 2020-11-25 — End: 2020-11-25
  Administered 2020-11-25: 4 mg via INTRAVENOUS

## 2020-11-25 MED ORDER — HEPARIN SOD (PORK) LOCK FLUSH 100 UNIT/ML IV SOLN
500.0000 [IU] | Freq: Once | INTRAVENOUS | Status: AC | PRN
  Administered 2020-11-25: 500 [IU]
  Filled 2020-11-25: qty 5

## 2020-11-25 MED ORDER — DEXAMETHASONE SODIUM PHOSPHATE 10 MG/ML IJ SOLN
INTRAMUSCULAR | Status: AC
  Filled 2020-11-25: qty 1

## 2020-11-25 MED ORDER — LORAZEPAM 2 MG/ML IJ SOLN
INTRAMUSCULAR | Status: AC
  Filled 2020-11-25: qty 1

## 2020-11-25 MED ORDER — FAMOTIDINE IN NACL 20-0.9 MG/50ML-% IV SOLN
20.0000 mg | Freq: Once | INTRAVENOUS | Status: AC
  Administered 2020-11-25: 20 mg via INTRAVENOUS

## 2020-11-25 MED ORDER — SODIUM CHLORIDE 0.9% FLUSH
10.0000 mL | Freq: Once | INTRAVENOUS | Status: AC
  Administered 2020-11-25: 10 mL
  Filled 2020-11-25: qty 10

## 2020-11-25 MED ORDER — LORAZEPAM 2 MG/ML IJ SOLN
0.5000 mg | Freq: Once | INTRAMUSCULAR | Status: AC
  Administered 2020-11-25: 0.5 mg via INTRAVENOUS

## 2020-11-25 MED ORDER — VENLAFAXINE HCL ER 37.5 MG PO CP24: 37.5000 mg | ORAL_CAPSULE | Freq: Every day | ORAL | 2 refills | Status: DC

## 2020-11-25 NOTE — Progress Notes (Signed)
Autumn Bullock  [phone] [phone]     ID: Autumn Bullock DOB: 09/15/1975  MR#: [id number]  BMW#:[id number]  Patient Care Team: Brock Ra, PA-C as PCP - General Mauro Kaufmann, RN as Oncology Nurse Navigator Rockwell Germany, RN as Oncology Nurse Navigator Donnie Mesa, MD as Consulting Physician (General Surgery) Eppie Gibson, MD as Attending Physician (Radiation Oncology) Magrinat, Virgie Dad, MD as Consulting Physician (Oncology) Vanessa Kick, MD as Consulting Physician (Obstetrics and Gynecology) Lovett Calender, MD as Consulting Physician (Orthopedic Surgery) Scot Dock, NP OTHER MD:  CHIEF COMPLAINT: Estrogen receptor positive breast cancer  CURRENT TREATMENT: Adjuvant chemotherapy   INTERVAL HISTORY: Autumn Bullock returns today for follow up and treatment of her estrogen receptor positive breast cancer. She is accompanied by her mother  She continues on weekly Paclitaxel.  Today is week 7 of treatment.  She notes that she is increasingly anxious and tearful.  She has transitioned back to work and her mom went back home who had been staying with her.  She has struggled with this.  She wants to know if there is anything that she could be prescribed for her anxiety.     REVIEW OF SYSTEMS: Other than Autumn Bullock's anxiety she is doing well.  She has manageable loose bms from time to time, and she has some intermittent nausea.  She denies peripheral neuropathy.  She is working part time at school.  A detailed ROS was otherwise non contributory.     COVID 19 VACCINATION STATUS: Status post Pfizer x2, most recent treatment April 2021.  Patient also was diagnosed with COVID-19 disease November 2020   HISTORY OF CURRENT ILLNESS: From the original intake note:  Autumn Bullock had routine screening mammography on 06/02/2020 showing a possible abnormality in the right breast. She underwent right diagnostic mammography with tomography and right breast  ultrasonography at [facility] on 06/08/2020 showing: breast density category C; 2.3 cm right breast mass at 11:30; 0.8 cm mass superficial to dominant mass; no right axillary adenopathy.  Accordingly on 06/08/2020 she proceeded to biopsy of the right breast area in question. The pathology from this procedure ([id number]) showed: invasive and in situ mammary carcinoma, grade 2, e-cadherin positive. Both biopsied masses showed this and were found to be morphologically similar. Prognostic indicators significant for: estrogen receptor, 90% positive and progesterone receptor, 70% positive, both with strong staining intensity. Proliferation marker Ki67 at 5%. HER2 negative by immunohistochemistry (1+).  The patient's subsequent history is as detailed below.   PAST MEDICAL HISTORY: Past Medical History:  Diagnosis Date  . Family history of uterine cancer 06/16/2020  . PONV (postoperative nausea and vomiting)     PAST SURGICAL HISTORY: Past Surgical History:  Procedure Laterality Date  . BREAST LUMPECTOMY WITH RADIOACTIVE SEED AND SENTINEL LYMPH NODE BIOPSY Right 07/14/2020   Procedure: RIGHT BREAST LUMPECTOMY WITH BRACKETED RADIOACTIVE SEED AND SENTINEL LYMPH NODE BIOPSY, BLUE DYE INJECTION;  Surgeon: Donnie Mesa, MD;  Location: [facility];  Service: General;  Laterality: Right;  PEC BLOCK, BLUE DYE INJECTION  . CESAREAN SECTION     2011  . PORTACATH PLACEMENT Right 07/14/2020   Procedure: INSERTION PORT-A-CATH WITH ULTRASOUND GUIDANCE;  Surgeon: Donnie Mesa, MD;  Location: [facility];  Service: General;  Laterality: Right;  . RE-EXCISION OF BREAST LUMPECTOMY Right 08/03/2020   Procedure: RE-EXCISION OF RIGHT BREAST LUMPECTOMY SUPERIOR MARGINS;  Surgeon: Donnie Mesa, MD;  Location: [facility];  Service: General;  Laterality: Right;  LMA    FAMILY HISTORY: Family History  Problem Relation Age of Onset  . Cancer Maternal Grandmother 53        Unknown GYN.  ?Uterine  As of October 2021 her parents are both living, her father at age 33 and her mother at 2, . Autumn Bullock has 1 brother and 2 sisters. She reports uterine cancer in her maternal grandmother at age 41. There is no family history of breast, ovarian, or colon cancer to her knowledge.   GYNECOLOGIC HISTORY:  No LMP recorded. Menarche: 45 years old Age at first live birth: 45 years old [place] P 1 LMP 05/05/2020, regular monthly periods, lasting 3 days with 1-2 heavy days Contraceptive: has used for 15 years, no complications HRT n/a  Hysterectomy? no BSO? 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 Husband Autumn Bullock works in Nature conservation officer. She lives at home with Autumn Bullock and their daughter Autumn Bullock, age 48. She attends [employer] in [place].    ADVANCED DIRECTIVES: In the absence of any documentation to the contrary, the patient's spouse is their HCPOA.    HEALTH MAINTENANCE: Social History   Tobacco Use  . Smoking status: Never Smoker  . Smokeless tobacco: Never Used  Substance Use Topics  . Alcohol use: Yes    Comment: 7/week  . Drug use: Never     Colonoscopy: 1999?  PAP: 05/31/2020  Bone density: never done   Allergies  Allergen Reactions  . Wound Dressing Adhesive     Tape etc     Current Outpatient Medications  Medication Sig Dispense Refill  . docusate sodium (COLACE) 100 MG capsule Take 2 capsules (200 mg total) by mouth 2 (two) times daily. (Patient not taking: No sig reported) 80 capsule 0  . lidocaine-prilocaine (EMLA) cream Apply to affected area once (Patient not taking: Reported on 11/25/2020) 30 g 3  . metroNIDAZOLE (METROGEL) 1 % gel Apply topically daily. (Patient not taking: No sig reported) 45 g 0  . omeprazole (PRILOSEC) 40 MG capsule Take 1 capsule (40 mg total) by mouth at bedtime. (Patient not taking: No sig reported) 90 capsule 4  . ondansetron (ZOFRAN ODT) 4 MG  disintegrating tablet Take 1 tablet (4 mg total) by mouth every 8 (eight) hours as needed for nausea or vomiting. (Patient not taking: No sig reported) 20 tablet 2  . prochlorperazine (COMPAZINE) 10 MG tablet Take 1 tablet (10 mg total) by mouth every 6 (six) hours as needed (Nausea or vomiting). (Patient not taking: No sig reported) 30 tablet 1   No current facility-administered medications for this visit.    OBJECTIVE:   Vitals:   11/25/20 1332  BP: 114/64  Pulse: 87  Resp: 18  Temp: (!) 97.4 F (36.3 C)     Body mass index is 27.49 kg/m.   Wt Readings from Last 3 Encounters:  11/25/20 165 lb 3.2 oz (74.9 kg)  11/11/20 166 lb 3.2 oz (75.4 kg)  10/28/20 164 lb 4.8 oz (74.5 kg)     ECOG FS:1 - Symptomatic but completely ambulatory  GENERAL: Patient is a well appearing female in no acute distress HEENT:  Sclerae anicteric.  Oropharynx clear and moist. No ulcerations or evidence of oropharyngeal candidiasis. Neck is supple.  NODES:  No cervical, supraclavicular, or axillary lymphadenopathy palpated.  BREAST EXAM:  Deferred. LUNGS:  Clear to auscultation bilaterally.  No wheezes or rhonchi. HEART:  Regular rate and rhythm. No murmur appreciated. ABDOMEN:  Soft, nontender.  Positive, normoactive bowel sounds. No organomegaly palpated. MSK:  No focal spinal tenderness to palpation. Full range of motion bilaterally in the upper extremities. EXTREMITIES:  No peripheral edema.   SKIN:  Clear with no obvious rashes or skin changes. No nail dyscrasia. NEURO:  Nonfocal. Well oriented.  Anxious and tearful today     LAB RESULTS:  CMP     Component Value Date/Time   NA 140 11/18/2020 1350   K 3.8 11/18/2020 1350   CL 108 11/18/2020 1350   CO2 24 11/18/2020 1350   GLUCOSE 112 (H) 11/18/2020 1350   BUN 15 11/18/2020 1350   CREATININE 0.82 11/18/2020 1350   CREATININE 0.73 08/17/2020 0905   CALCIUM 9.0 11/18/2020 1350   PROT 7.1 11/18/2020 1350   ALBUMIN 4.3 11/18/2020 1350    AST 29 11/18/2020 1350   AST 18 08/17/2020 0905   ALT 41 11/18/2020 1350   ALT 18 08/17/2020 0905   ALKPHOS 72 11/18/2020 1350   BILITOT 0.3 11/18/2020 1350   BILITOT 0.7 08/17/2020 0905   GFRNONAA >60 11/18/2020 1350   GFRNONAA >60 08/17/2020 0905    No results found for: TOTALPROTELP, ALBUMINELP, A1GS, A2GS, BETS, BETA2SER, GAMS, MSPIKE, SPEI  Lab Results  Component Value Date   WBC 3.3 (L) 11/25/2020   NEUTROABS 2.3 11/25/2020   HGB 9.6 (L) 11/25/2020   HCT 27.4 (L) 11/25/2020   MCV 98.9 11/25/2020   PLT 334 11/25/2020    No results found for: LABCA2  No components found for: QIHKVQ259  No results for input(s): INR in the last 168 hours.  No results found for: LABCA2  No results found for: DGL875  No results found for: IEP329  No results found for: JJO841  No results found for: CA2729  No components found for: HGQUANT  No results found for: CEA1 / No results found for: CEA1   No results found for: AFPTUMOR  No results found for: CHROMOGRNA  No results found for: KPAFRELGTCHN, LAMBDASER, KAPLAMBRATIO (kappa/lambda light chains)  No results found for: HGBA, HGBA2QUANT, HGBFQUANT, HGBSQUAN (Hemoglobinopathy evaluation)   No results found for: LDH  No results found for: IRON, TIBC, IRONPCTSAT (Iron and TIBC)  No results found for: FERRITIN  Urinalysis    Component Value Date/Time   COLORURINE YELLOW 09/02/2020 0843   APPEARANCEUR HAZY (A) 09/02/2020 0843   LABSPEC 1.010 09/02/2020 0843   PHURINE 6.0 09/02/2020 0843   GLUCOSEU NEGATIVE 09/02/2020 0843   HGBUR SMALL (A) 09/02/2020 0843   BILIRUBINUR NEGATIVE 09/02/2020 0843   KETONESUR NEGATIVE 09/02/2020 0843   PROTEINUR NEGATIVE 09/02/2020 0843   NITRITE NEGATIVE 09/02/2020 0843   LEUKOCYTESUR TRACE (A) 09/02/2020 0843    STUDIES: No results found.   ELIGIBLE FOR AVAILABLE RESEARCH PROTOCOL: AET  ASSESSMENT: 45 y.o. [place] woman status post right breast upper outer quadrant biopsy  06/08/2020 for a clinical mT2 N0, stage IB invasive ductal carcinoma, grade 2, estrogen and progesterone receptor positive, HER-2 not amplified, with an MIB-1 of 5%.  (1) genetics testing 06/22/2020 through the Common Hereditary Cancers Panel offered by [facility] found no deleterious mutations in APC, ATM, AXIN2, BARD1, BMPR1A, BRCA1, BRCA2, BRIP1, CDH1, CDK4, CDKN2A (p14ARF), CDKN2A (p16INK4a), CHEK2, CTNNA1, DICER1, EPCAM (Deletion/duplication testing only), GREM1 (promoter region deletion/duplication testing only), KIT, MEN1, MLH1, MSH2, MSH3, MSH6, MUTYH, NBN, NF1, NHTL1, PALB2, PDGFRA, PMS2, POLD1, POLE, PTEN, RAD50, RAD51C, RAD51D, RNF43, SDHB, SDHC, SDHD, SMAD4, SMARCA4. STK11, TP53, TSC1, TSC2, and VHL.  The following genes were evaluated for  sequence changes only: SDHA and HOXB13 c.251G>A variant only.  (a)  a variant of uncertain significance in MSH3 at c.2731T>G (p.Leu911Val).   (2) right lumpectomy and sentinel lymph node sampling 07/14/2020 showed a pT2 pN1(mic), stage IIA invasive ductal carcinoma, grade 2, with a positive superior margin  (a) additional surgery 08/03/2020  (3) MammaPrint obtained from the initial biopsy showed high risk, predicting a 93% 5-year disease-free survival with chemotherapy and significant benefit from chemotherapy (greater than 12%).  (4) cyclophosphamide and doxorubicin in dose dense fashion x4 started 10/15/2020 08/17/2020, completed 09/28/2020, followed by paclitaxel weekly x12 started  (a) echo 07/09/2020 shows an ejection fraction in the 60-65% range.  (5) adjuvant radiation to follow  (6) antiestrogens to start at the completion of local treatment   PLAN: Autumn Bullock continues on adjuvant chemotherapy with weekly Paclitaxel.  She is tolerating this well and has no peripheral neuroapthy.  She will continue with this today.  Her WBC are normal today and they have been slightly decreased in the past.  I reviewed this with her and gave her reassurance.     We talked about her anxiety and I reassured her that this is very common during cancer treatment.  I prescribed low dose effexor xr for her to take.  I gave her info about this in her AVS and we reviewed risks and benefits.  She is going to start this tomorrow and will let me know how she does.    Autumn Bullock will return weekly for treatment and will see myself or Dr. Jana Hakim with every other visit.  She knows to call for any questions that may arise between now and her next appointment.  We are happy to see her sooner if needed.  Total encounter time 30 minutes.Wilber Bihari, NP 11/25/20 1:45 PM Medical Oncology and Hematology [facility] [place], [place] [address] Tel. [phone]    Fax. [phone]  *Total Encounter Time as defined by the Centers for Medicare and Medicaid Services includes, in addition to the face-to-face time of a patient visit (documented in the note above) non-face-to-face time: obtaining and reviewing outside history, ordering and reviewing medications, tests or procedures, care coordination (communications with other health care professionals or caregivers) and documentation in the medical record.

## 2020-11-25 NOTE — Patient Instructions (Signed)
Implanted Port Insertion, Care After This sheet gives you information about how to care for yourself after your procedure. Your health care provider may also give you more specific instructions. If you have problems or questions, contact your health care provider. What can I expect after the procedure? After the procedure, it is common to have:  Discomfort at the port insertion site.  Bruising on the skin over the port. This should improve over 3-4 days. Follow these instructions at home: Port care  After your port is placed, you will get a manufacturer's information card. The card has information about your port. Keep this card with you at all times.  Take care of the port as told by your health care provider. Ask your health care provider if you or a family member can get training for taking care of the port at home. A home health care nurse may also take care of the port.  Make sure to remember what type of port you have. Incision care  Follow instructions from your health care provider about how to take care of your port insertion site. Make sure you: ? Wash your hands with soap and water before and after you change your bandage (dressing). If soap and water are not available, use hand sanitizer. ? Change your dressing as told by your health care provider. ? Leave stitches (sutures), skin glue, or adhesive strips in place. These skin closures may need to stay in place for 2 weeks or longer. If adhesive strip edges start to loosen and curl up, you may trim the loose edges. Do not remove adhesive strips completely unless your health care provider tells you to do that.  Check your port insertion site every day for signs of infection. Check for: ? Redness, swelling, or pain. ? Fluid or blood. ? Warmth. ? Pus or a bad smell.      Activity  Return to your normal activities as told by your health care provider. Ask your health care provider what activities are safe for you.  Do not  lift anything that is heavier than 10 lb (4.5 kg), or the limit that you are told, until your health care provider says that it is safe. General instructions  Take over-the-counter and prescription medicines only as told by your health care provider.  Do not take baths, swim, or use a hot tub until your health care provider approves. Ask your health care provider if you may take showers. You may only be allowed to take sponge baths.  Do not drive for 24 hours if you were given a sedative during your procedure.  Wear a medical alert bracelet in case of an emergency. This will tell any health care providers that you have a port.  Keep all follow-up visits as told by your health care provider. This is important. Contact a health care provider if:  You cannot flush your port with saline as directed, or you cannot draw blood from the port.  You have a fever or chills.  You have redness, swelling, or pain around your port insertion site.  You have fluid or blood coming from your port insertion site.  Your port insertion site feels warm to the touch.  You have pus or a bad smell coming from the port insertion site. Get help right away if:  You have chest pain or shortness of breath.  You have bleeding from your port that you cannot control. Summary  Take care of the port as told by your   health care provider. Keep the manufacturer's information card with you at all times.  Change your dressing as told by your health care provider.  Contact a health care provider if you have a fever or chills or if you have redness, swelling, or pain around your port insertion site.  Keep all follow-up visits as told by your health care provider. This information is not intended to replace advice given to you by your health care provider. Make sure you discuss any questions you have with your health care provider. Document Revised: 03/19/2018 Document Reviewed: 03/19/2018 Elsevier Patient Education   2021 Elsevier Inc.  

## 2020-11-25 NOTE — Patient Instructions (Signed)
Venlafaxine Extended-Release Capsules What is this medicine? VENLAFAXINE(VEN la fax een) is used to treat depression, anxiety and panic disorder. This medicine may be used for other purposes; ask your health care provider or pharmacist if you have questions. COMMON BRAND NAME(S): Effexor XR What should I tell my health care provider before I take this medicine? They need to know if you have any of these conditions:  bleeding disorders  glaucoma  heart disease  high blood pressure  high cholesterol  kidney disease  liver disease  low levels of sodium in the blood  mania or bipolar disorder  seizures  suicidal thoughts, plans, or attempt; a previous suicide attempt by you or a family  take medicines that treat or prevent blood clots  thyroid disease  an unusual or allergic reaction to venlafaxine, desvenlafaxine, other medicines, foods, dyes, or preservatives  pregnant or trying to get pregnant  breast-feeding How should I use this medicine? Take this medicine by mouth with a full glass of water. Follow the directions on the prescription label. Do not cut, crush, or chew this medicine. Take it with food. If needed, the capsule may be carefully opened and the entire contents sprinkled on a spoonful of cool applesauce. Swallow the applesauce/pellet mixture right away without chewing and follow with a glass of water to ensure complete swallowing of the pellets. Try to take your medicine at about the same time each day. Do not take your medicine more often than directed. Do not stop taking this medicine suddenly except upon the advice of your doctor. Stopping this medicine too quickly may cause serious side effects or your condition may worsen. A special MedGuide will be given to you by the pharmacist with each prescription and refill. Be sure to read this information carefully each time. Talk to your pediatrician regarding the use of this medicine in children. Special care may be  needed. Overdosage: If you think you have taken too much of this medicine contact a poison control center or emergency room at once. NOTE: This medicine is only for you. Do not share this medicine with others. What if I miss a dose? If you miss a dose, take it as soon as you can. If it is almost time for your next dose, take only that dose. Do not take double or extra doses. What may interact with this medicine? Do not take this medicine with any of the following medications:  certain medicines for fungal infections like fluconazole, itraconazole, ketoconazole, posaconazole, voriconazole  cisapride  desvenlafaxine  dronedarone  duloxetine  levomilnacipran  linezolid  MAOIs like Carbex, Eldepryl, Marplan, Nardil, and Parnate  methylene blue (injected into a vein)  milnacipran  pimozide  thioridazine This medicine may also interact with the following medications:  amphetamines  aspirin and aspirin-like medicines  certain medicines for depression, anxiety, or psychotic disturbances  certain medicines for migraine headaches like almotriptan, eletriptan, frovatriptan, naratriptan, rizatriptan, sumatriptan, zolmitriptan  certain medicines for sleep  certain medicines that treat or prevent blood clots like dalteparin, enoxaparin, warfarin  cimetidine  clozapine  diuretics  fentanyl  furazolidone  indinavir  isoniazid  lithium  metoprolol  NSAIDS, medicines for pain and inflammation, like ibuprofen or naproxen  other medicines that prolong the QT interval (cause an abnormal heart rhythm) like dofetilide, ziprasidone  procarbazine  rasagiline  supplements like St. John's wort, kava kava, valerian  tramadol  tryptophan This list may not describe all possible interactions. Give your health care provider a list of all the medicines,   herbs, non-prescription drugs, or dietary supplements you use. Also tell them if you smoke, drink alcohol, or use illegal  drugs. Some items may interact with your medicine. What should I watch for while using this medicine? Tell your doctor if your symptoms do not get better or if they get worse. Visit your doctor or health care professional for regular checks on your progress. Because it may take several weeks to see the full effects of this medicine, it is important to continue your treatment as prescribed by your doctor. Patients and their families should watch out for new or worsening thoughts of suicide or depression. Also watch out for sudden changes in feelings such as feeling anxious, agitated, panicky, irritable, hostile, aggressive, impulsive, severely restless, overly excited and hyperactive, or not being able to sleep. If this happens, especially at the beginning of treatment or after a change in dose, call your health care professional. This medicine can cause an increase in blood pressure. Check with your doctor for instructions on monitoring your blood pressure while taking this medicine. You may get drowsy or dizzy. Do not drive, use machinery, or do anything that needs mental alertness until you know how this medicine affects you. Do not stand or sit up quickly, especially if you are an older patient. This reduces the risk of dizzy or fainting spells. Alcohol may interfere with the effect of this medicine. Avoid alcoholic drinks. Your mouth may get dry. Chewing sugarless gum, sucking hard candy and drinking plenty of water will help. Contact your doctor if the problem does not go away or is severe. What side effects may I notice from receiving this medicine? Side effects that you should report to your doctor or health care professional as soon as possible:  allergic reactions like skin rash, itching or hives, swelling of the face, lips, or tongue  anxious  breathing problems  confusion  changes in vision  chest pain  confusion  elevated mood, decreased need for sleep, racing thoughts, impulsive  behavior  eye pain  fast, irregular heartbeat  feeling faint or lightheaded, falls  feeling agitated, angry, or irritable  hallucination, loss of contact with reality  high blood pressure  loss of balance or coordination  palpitations  redness, blistering, peeling or loosening of the skin, including inside the mouth  restlessness, pacing, inability to keep still  seizures  stiff muscles  suicidal thoughts or other mood changes  trouble passing urine or change in the amount of urine  trouble sleeping  unusual bleeding or bruising  unusually weak or tired  vomiting Side effects that usually do not require medical attention (report to your doctor or health care professional if they continue or are bothersome):  change in sex drive or performance  change in appetite or weight  constipation  dizziness  dry mouth  headache  increased sweating  nausea  tired This list may not describe all possible side effects. Call your doctor for medical advice about side effects. You may report side effects to FDA at 1-800-FDA-1088. Where should I keep my medicine? Keep out of the reach of children. Store at a controlled temperature between 20 and 25 degrees C (68 degrees and 77 degrees F), in a dry place. Throw away any unused medicine after the expiration date. NOTE: This sheet is a summary. It may not cover all possible information. If you have questions about this medicine, talk to your doctor, pharmacist, or health care provider.  2021 Elsevier/Gold Standard (2020-07-12 14:56:43)  

## 2020-11-25 NOTE — Patient Instructions (Signed)
[facility] Discharge Instructions for Patients Receiving Chemotherapy  Today you received the following chemotherapy agents Paclitaxel  To help prevent nausea and vomiting after your treatment, we encourage you to take your nausea medication as directed   If you develop nausea and vomiting that is not controlled by your nausea medication, call the clinic.   BELOW ARE SYMPTOMS THAT SHOULD BE REPORTED IMMEDIATELY:  *FEVER GREATER THAN 100.5 F  *CHILLS WITH OR WITHOUT FEVER  NAUSEA AND VOMITING THAT IS NOT CONTROLLED WITH YOUR NAUSEA MEDICATION  *UNUSUAL SHORTNESS OF BREATH  *UNUSUAL BRUISING OR BLEEDING  TENDERNESS IN MOUTH AND THROAT WITH OR WITHOUT PRESENCE OF ULCERS  *URINARY PROBLEMS  *BOWEL PROBLEMS  UNUSUAL RASH Items with * indicate a potential emergency and should be followed up as soon as possible.  Feel free to call the clinic should you have any questions or concerns. The clinic phone number is [phone].  Please show the CHEMO ALERT CARD at check-in to the Emergency Department and triage nurse.   

## 2020-11-26 ENCOUNTER — Other Ambulatory Visit: Payer: Self-pay

## 2020-11-26 ENCOUNTER — Encounter: Payer: Self-pay | Admitting: Nurse Practitioner

## 2020-11-26 ENCOUNTER — Inpatient Hospital Stay: Payer: BC Managed Care – PPO

## 2020-11-26 ENCOUNTER — Telehealth: Payer: Self-pay

## 2020-11-26 ENCOUNTER — Telehealth: Payer: Self-pay | Admitting: Nurse Practitioner

## 2020-11-26 ENCOUNTER — Inpatient Hospital Stay: Payer: BC Managed Care – PPO | Admitting: Nurse Practitioner

## 2020-11-26 VITALS — BP 119/86 | HR 98 | Temp 97.8°F | Resp 18 | Ht 65.0 in | Wt 164.9 lb

## 2020-11-26 DIAGNOSIS — Z170 Estrogen receptor positive status [ER+]: Secondary | ICD-10-CM

## 2020-11-26 DIAGNOSIS — C50411 Malignant neoplasm of upper-outer quadrant of right female breast: Secondary | ICD-10-CM

## 2020-11-26 DIAGNOSIS — Z5111 Encounter for antineoplastic chemotherapy: Secondary | ICD-10-CM | POA: Diagnosis not present

## 2020-11-26 LAB — RESP PANEL BY RT-PCR (RSV, FLU A&B, COVID)  RVPGX2
Influenza A by PCR: NEGATIVE
Influenza B by PCR: NEGATIVE
Resp Syncytial Virus by PCR: NEGATIVE
SARS Coronavirus 2 by RT PCR: NEGATIVE

## 2020-11-26 MED ORDER — AMOXICILLIN-POT CLAVULANATE 500-125 MG PO TABS: 1.0000 | ORAL_TABLET | Freq: Three times a day (TID) | ORAL | 0 refills | Status: DC

## 2020-11-26 MED FILL — AMOX-CLAV 500-125 MG TABLET: 500-125 | 7 days supply | Qty: 21 | Fill #0

## 2020-11-26 NOTE — Telephone Encounter (Signed)
I contacted Autumn Bullock to let her know covid/influenza swab returned negative.

## 2020-11-26 NOTE — Progress Notes (Signed)
  [facility] OFFICE PROGRESS NOTE   Diagnosis: Breast cancer  INTERVAL HISTORY:   Ms. Blouch returns prior to scheduled follow-up.  She was seen 11/26/2020 by Wilber Bihari, nurse practitioner, and completed a cycle of weekly Taxol.  She contacted the office this morning to report sinus pressure/congestion, soft, sore throat and ear congestion.  She had a nosebleed last night.  The nosebleeds have been occurring intermittently throughout the course of Taxol.  For the past 7 to 10 days she has been experiencing what she thought were allergy symptoms.  She has been taking Claritin.  Last night the symptoms worsened.  She developed a pressure sensation on her face, nonproductive cough, nasal congestion with thick green mucus and a sore throat which she feels is coming from sinus drainage.  No fever.  No shortness of breath.  She has been taking Tylenol with some improvement.    Objective:  Vital signs in last 24 hours:  Blood pressure 119/86, pulse 98, temperature 97.8 F (36.6 C), temperature source Tympanic, resp. rate 18, height 5\' 5"  (1.651 m), weight 164 lb 14.4 oz (74.8 kg).    HEENT: Posterior pharynx is without erythema. Lymphatics: No palpable cervical or supraclavicular lymph nodes. Resp: Lungs clear bilaterally. Cardio: Regular rate and rhythm. GI: Abdomen soft and nontender.  No hepatomegaly. Vascular: No leg edema.  Port-A-Cath without erythema.  Lab Results:  Lab Results  Component Value Date   WBC 3.3 (L) 11/25/2020   HGB 9.6 (L) 11/25/2020   HCT 27.4 (L) 11/25/2020   MCV 98.9 11/25/2020   PLT 334 11/25/2020   NEUTROABS 2.3 11/25/2020    Imaging:  No results found.  Medications: I have reviewed the patient's current medications.  She reports the only medications she is currently taking are Effexor, Tylenol as needed, Claritin.  Assessment/Plan: 1. Right breast cancer completing adjuvant chemotherapy, cycle 7 weekly Taxol  11/25/2020 2. Allergy/sinus symptoms 11/26/2020  Disposition: Ms. Hanzlik appears stable.  She completed cycle 7 weekly Taxol yesterday.  She is seen today for worsening upper respiratory symptoms, initially started 7 to 10 days ago.  She is afebrile, no shortness of breath.  We obtained a Covid test.  She will begin Augmentin 500/125 3 times a day for 7 days for possible bacterial sinusitis.  She will make sure she is taking in adequate fluids.  She will continue Tylenol as needed.  She understands to contact the office if current symptoms worsen or she develops fever, chills, other worrisome symptoms.  Above reviewed with Dr. Lindi Adie.    Ned Card ANP/GNP-BC   11/26/2020  12:16 PM

## 2020-11-26 NOTE — Telephone Encounter (Signed)
Pt called stating she woke up with sinus pressure/congestion, cough, sore throat, ears congested. Pt denies fever/COVID exposure. Pt also states she has had epistaxis since starting Taxol, and had an occurrence last night, but not yet today. Pt was offered appt w/Lisa Marcello Moores, NP for evaluation at 1215 and accepted. Pt understands to arrive 15 mins early for check in.

## 2020-12-02 ENCOUNTER — Inpatient Hospital Stay: Payer: BC Managed Care – PPO

## 2020-12-02 ENCOUNTER — Other Ambulatory Visit: Payer: Self-pay

## 2020-12-02 VITALS — BP 111/65 | HR 69 | Temp 97.1°F | Resp 16

## 2020-12-02 DIAGNOSIS — C50411 Malignant neoplasm of upper-outer quadrant of right female breast: Secondary | ICD-10-CM

## 2020-12-02 DIAGNOSIS — Z95828 Presence of other vascular implants and grafts: Secondary | ICD-10-CM

## 2020-12-02 DIAGNOSIS — Z170 Estrogen receptor positive status [ER+]: Secondary | ICD-10-CM

## 2020-12-02 DIAGNOSIS — Z5111 Encounter for antineoplastic chemotherapy: Secondary | ICD-10-CM | POA: Diagnosis not present

## 2020-12-02 LAB — COMPREHENSIVE METABOLIC PANEL
ALT: 50 U/L — ABNORMAL HIGH (ref 0–44)
AST: 32 U/L (ref 15–41)
Albumin: 4.3 g/dL (ref 3.5–5.0)
Alkaline Phosphatase: 64 U/L (ref 38–126)
Anion gap: 11 (ref 5–15)
BUN: 14 mg/dL (ref 6–20)
CO2: 24 mmol/L (ref 22–32)
Calcium: 8.6 mg/dL — ABNORMAL LOW (ref 8.9–10.3)
Chloride: 106 mmol/L (ref 98–111)
Creatinine, Ser: 0.76 mg/dL (ref 0.44–1.00)
GFR, Estimated: >60 mL/min (ref >60)
Glucose, Bld: 108 mg/dL — ABNORMAL HIGH (ref 70–99)
Potassium: 3.7 mmol/L (ref 3.5–5.1)
Sodium: 141 mmol/L (ref 135–145)
Total Bilirubin: 0.4 mg/dL (ref 0.3–1.2)
Total Protein: 7.2 g/dL (ref 6.5–8.1)

## 2020-12-02 LAB — CBC WITH DIFFERENTIAL/PLATELET
Abs Immature Granulocytes: 0.06 10*3/uL (ref 0.00–0.07)
Basophils Absolute: 0 10*3/uL (ref 0.0–0.1)
Basophils Relative: 1 %
Eosinophils Absolute: 0.1 10*3/uL (ref 0.0–0.5)
Eosinophils Relative: 2 %
HCT: 29.2 % — ABNORMAL LOW (ref 36.0–46.0)
Hemoglobin: 10.1 g/dL — ABNORMAL LOW (ref 12.0–15.0)
Immature Granulocytes: 2 %
Lymphocytes Relative: 34 %
Lymphs Abs: 1.2 10*3/uL (ref 0.7–4.0)
MCH: 34.1 pg — ABNORMAL HIGH (ref 26.0–34.0)
MCHC: 34.6 g/dL (ref 30.0–36.0)
MCV: 98.6 fL (ref 80.0–100.0)
Monocytes Absolute: 0.3 10*3/uL (ref 0.1–1.0)
Monocytes Relative: 8 %
Neutro Abs: 1.9 10*3/uL (ref 1.7–7.7)
Neutrophils Relative %: 53 %
Platelets: 386 10*3/uL (ref 150–400)
RBC: 2.96 MIL/uL — ABNORMAL LOW (ref 3.87–5.11)
RDW: 14.6 % (ref 11.5–15.5)
WBC: 3.6 10*3/uL — ABNORMAL LOW (ref 4.0–10.5)
nRBC: 0 % (ref 0.0–0.2)

## 2020-12-02 LAB — PREGNANCY, URINE: Preg Test, Ur: NEGATIVE

## 2020-12-02 MED ORDER — SODIUM CHLORIDE 0.9 % IV SOLN
80.0000 mg/m2 | Freq: Once | INTRAVENOUS | Status: AC
  Administered 2020-12-02: 144 mg via INTRAVENOUS
  Filled 2020-12-02: qty 24

## 2020-12-02 MED ORDER — LORAZEPAM 2 MG/ML IJ SOLN
INTRAMUSCULAR | Status: AC
  Filled 2020-12-02: qty 1

## 2020-12-02 MED ORDER — DEXAMETHASONE SODIUM PHOSPHATE 10 MG/ML IJ SOLN
4.0000 mg | Freq: Once | INTRAMUSCULAR | Status: AC
  Administered 2020-12-02: 4 mg via INTRAVENOUS

## 2020-12-02 MED ORDER — FAMOTIDINE IN NACL 20-0.9 MG/50ML-% IV SOLN
20.0000 mg | Freq: Once | INTRAVENOUS | Status: AC
  Administered 2020-12-02: 20 mg via INTRAVENOUS

## 2020-12-02 MED ORDER — HEPARIN SOD (PORK) LOCK FLUSH 100 UNIT/ML IV SOLN
500.0000 [IU] | Freq: Once | INTRAVENOUS | Status: AC | PRN
  Administered 2020-12-02: 500 [IU]
  Filled 2020-12-02: qty 5

## 2020-12-02 MED ORDER — SODIUM CHLORIDE 0.9 % IV SOLN
Freq: Once | INTRAVENOUS | Status: AC
  Filled 2020-12-02: qty 250

## 2020-12-02 MED ORDER — SODIUM CHLORIDE 0.9% FLUSH
10.0000 mL | INTRAVENOUS | Status: DC | PRN
  Administered 2020-12-02: 10 mL
  Filled 2020-12-02: qty 10

## 2020-12-02 MED ORDER — SODIUM CHLORIDE 0.9% FLUSH
10.0000 mL | Freq: Once | INTRAVENOUS | Status: AC
  Administered 2020-12-02: 10 mL
  Filled 2020-12-02: qty 10

## 2020-12-02 MED ORDER — FAMOTIDINE IN NACL 20-0.9 MG/50ML-% IV SOLN
INTRAVENOUS | Status: AC
  Filled 2020-12-02: qty 50

## 2020-12-02 MED ORDER — DEXAMETHASONE SODIUM PHOSPHATE 10 MG/ML IJ SOLN
INTRAMUSCULAR | Status: AC
  Filled 2020-12-02: qty 1

## 2020-12-02 MED ORDER — LORAZEPAM 2 MG/ML IJ SOLN
0.5000 mg | Freq: Once | INTRAMUSCULAR | Status: AC
  Administered 2020-12-02: 0.5 mg via INTRAVENOUS

## 2020-12-02 NOTE — Patient Instructions (Signed)
[facility] Discharge Instructions for Patients Receiving Chemotherapy  Today you received the following chemotherapy agents:  Taxol.  To help prevent nausea and vomiting after your treatment, we encourage you to take your nausea medication as directed.   If you develop nausea and vomiting that is not controlled by your nausea medication, call the clinic.   BELOW ARE SYMPTOMS THAT SHOULD BE REPORTED IMMEDIATELY:  *FEVER GREATER THAN 100.5 F  *CHILLS WITH OR WITHOUT FEVER  NAUSEA AND VOMITING THAT IS NOT CONTROLLED WITH YOUR NAUSEA MEDICATION  *UNUSUAL SHORTNESS OF BREATH  *UNUSUAL BRUISING OR BLEEDING  TENDERNESS IN MOUTH AND THROAT WITH OR WITHOUT PRESENCE OF ULCERS  *URINARY PROBLEMS  *BOWEL PROBLEMS  UNUSUAL RASH Items with * indicate a potential emergency and should be followed up as soon as possible.  Feel free to call the clinic should you have any questions or concerns. The clinic phone number is [phone].  Please show the CHEMO ALERT CARD at check-in to the Emergency Department and triage nurse.   

## 2020-12-08 NOTE — Progress Notes (Signed)
[facility]  Telephone:([phone] [phone]     ID: Autumn Bullock DOB: April 05, 1976  MR#: [id number]  YTK#:[id number]  Patient Care Team: Brock Ra, PA-C as PCP - General Mauro Kaufmann, RN as Oncology Nurse Navigator Rockwell Germany, RN as Oncology Nurse Navigator Donnie Mesa, Autumn Bullock as Consulting Physician (General Surgery) Eppie Gibson, Autumn Bullock as Attending Physician (Radiation Oncology) Autumn Bullock, Autumn Dad, Autumn Bullock as Consulting Physician (Oncology) Vanessa Kick, Autumn Bullock as Consulting Physician (Obstetrics and Gynecology) Lovett Calender, Autumn Bullock as Consulting Physician (Orthopedic Surgery) Chauncey Cruel, Autumn Bullock OTHER Autumn Bullock:  CHIEF COMPLAINT: Estrogen receptor positive breast cancer  CURRENT TREATMENT: Adjuvant chemotherapy   INTERVAL HISTORY: Autumn Bullock returns today for follow up and treatment of her estrogen receptor positive breast cancer.  Her mother who usually accompanies has gone back [place], which indicates she felt pretty confident Autumn Bullock was doing well as ended she seems to be.  She continues on weekly Paclitaxel.  Today is week 9 of treatment.  She has had absolutely no peripheral neuropathy symptoms today despite very close questioning.  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  She continues to wear a mask at work since have her students do not wear masks and many of them have symptoms which she of course could not tell from Covid especially since the latest Covid version was so much like bronchitis.  This is of course allergy season.  At first she was totally fatigued after work but now she has a little bit of energy left when she gets home and she gets to the gym 1 day a week.  She is doing light weights.  A detailed review of systems today was otherwise stable.   COVID 19 VACCINATION STATUS: Status post Pfizer x2, most recent treatment April 2021.  Patient also was  diagnosed with COVID-19 disease November 2020   HISTORY OF CURRENT ILLNESS: From the original intake note:  Autumn Bullock had routine screening mammography on 06/02/2020 showing a possible abnormality in the right breast. She underwent right diagnostic mammography with tomography and right breast ultrasonography at [facility] on 06/08/2020 showing: breast density category C; 2.3 cm right breast mass at 11:30; 0.8 cm mass superficial to dominant mass; no right axillary adenopathy.  Accordingly on 06/08/2020 she proceeded to biopsy of the right breast area in question. The pathology from this procedure ([id number]) showed: invasive and in situ mammary carcinoma, grade 2, e-cadherin positive. Both biopsied masses showed this and were found to be morphologically similar. Prognostic indicators significant for: estrogen receptor, 90% positive and progesterone receptor, 70% positive, both with strong staining intensity. Proliferation marker Ki67 at 5%. HER2 negative by immunohistochemistry (1+).  The patient's subsequent history is as detailed below.   PAST MEDICAL HISTORY: Past Medical History:  Diagnosis Date  . Family history of uterine cancer 06/16/2020  . PONV (postoperative nausea and vomiting)     PAST SURGICAL HISTORY: Past Surgical History:  Procedure Laterality Date  . BREAST LUMPECTOMY WITH RADIOACTIVE SEED AND SENTINEL LYMPH NODE BIOPSY Right 07/14/2020   Procedure: RIGHT BREAST LUMPECTOMY WITH BRACKETED RADIOACTIVE SEED AND SENTINEL LYMPH NODE BIOPSY, BLUE DYE INJECTION;  Surgeon: Donnie Mesa, Autumn Bullock;  Location: [facility];  Service: General;  Laterality: Right;  PEC BLOCK, BLUE DYE INJECTION  . CESAREAN SECTION     2011  . PORTACATH PLACEMENT Right 07/14/2020   Procedure: INSERTION PORT-A-CATH WITH ULTRASOUND GUIDANCE;  Surgeon: Donnie Mesa, Autumn Bullock;  Location: [facility];  Service: General;  Laterality: Right;  . RE-EXCISION OF BREAST LUMPECTOMY Right  08/03/2020   Procedure: RE-EXCISION OF RIGHT BREAST LUMPECTOMY SUPERIOR MARGINS;  Surgeon: Donnie Mesa, Autumn Bullock;  Location: [facility];  Service: General;  Laterality: Right;  LMA    FAMILY HISTORY: Family History  Problem Relation Age of Onset  . Cancer Maternal Grandmother 77       Unknown GYN.  ?Uterine  As of October 2021 her parents are both living, her father at age 57 and her mother at 27, . Autumn Bullock has 1 brother and 2 sisters. She reports uterine cancer in her maternal grandmother at age 44. There is no family history of breast, ovarian, or colon cancer to her knowledge.   GYNECOLOGIC HISTORY:  No LMP recorded. Menarche: 45 years old Age at first live birth: 45 years old [place] P 1 LMP 05/05/2020, regular monthly periods, lasting 3 days with 1-2 heavy days Contraceptive: has used for 15 years, no complications HRT n/a  Hysterectomy? no BSO? 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 Husband Autumn Bullock works in Nature conservation officer. She lives at home with Autumn Bullock and their daughter Autumn Bullock, age 95. She attends [employer] in [place].    ADVANCED DIRECTIVES: In the absence of any documentation to the contrary, the patient's spouse is their HCPOA.    HEALTH MAINTENANCE: Social History   Tobacco Use  . Smoking status: Never Smoker  . Smokeless tobacco: Never Used  Substance Use Topics  . Alcohol use: Yes    Comment: 7/week  . Drug use: Never     Colonoscopy: 1999?  PAP: 05/31/2020  Bone density: never done   Allergies  Allergen Reactions  . Wound Dressing Adhesive     Tape etc     Current Outpatient Medications  Medication Sig Dispense Refill  . amoxicillin-clavulanate (AUGMENTIN) 500-125 MG tablet TAKE 1 TABLET BY MOUTH 3 TIMES DAILY FOR 7 DAYS. 21 tablet 0  . docusate sodium (COLACE) 100 MG capsule TAKE 2 CAPSULES BY MOUTH TWICE A DAY (Patient not taking: No sig reported) 80 capsule 0   . lidocaine-prilocaine (EMLA) cream APPLY TO AFFECTED AREA ONCE AS DIRECTED (Patient not taking: Reported on 11/25/2020) 30 g 3  . metroNIDAZOLE (METROGEL) 1 % gel APPLY TO AFFECTED AREAS DAILY AS DIRECTED (Patient not taking: No sig reported) 60 g 0  . omeprazole (PRILOSEC) 40 MG capsule TAKE 1 CAPSULE (40 MG TOTAL) BY MOUTH AT BEDTIME. (Patient not taking: No sig reported) 90 capsule 4  . ondansetron (ZOFRAN-ODT) 4 MG disintegrating tablet DISSOLVE 1 TABLET (4 MG TOTAL) BY MOUTH EVERY 8 (EIGHT) HOURS AS NEEDED FOR NAUSEA OR VOMITING. (Patient not taking: No sig reported) 20 tablet 2  . prochlorperazine (COMPAZINE) 10 MG tablet TAKE 1 TABLET BY MOUTH EVERY 6 HOURS AS NEEDED FOR NAUSEA AND/OR VOMITING (Patient not taking: No sig reported) 30 tablet 1  . venlafaxine XR (EFFEXOR-XR) 37.5 MG 24 hr capsule Take 1 capsule (37.5 mg total) by mouth daily with breakfast. Take 1 tab daily x 2 weeks, then increase to 2 tab daily 60 capsule 2   No current facility-administered medications for this visit.   Facility-Administered Medications Ordered in Other Visits  Medication Dose Route Frequency Provider Last Rate Last Admin  . famotidine (PEPCID) IVPB 20 mg premix  20 mg Intravenous Once Tekela Garguilo, Autumn Dad, Autumn Bullock      .  heparin lock flush 100 unit/mL  500 Units Intracatheter Once PRN Tiaja Hagan, Autumn Dad, Autumn Bullock      . LORazepam (ATIVAN) injection 0.5 mg  0.5 mg Intravenous Once Keino Placencia, Autumn Dad, Autumn Bullock      . PACLitaxel (TAXOL) 144 mg in sodium chloride 0.9 % 250 mL chemo infusion (</= 41m/m2)  80 mg/m2 (Treatment Plan Recorded) Intravenous Once Theda Payer, GVirgie Dad Autumn Bullock      . sodium chloride flush (NS) 0.9 % injection 10 mL  10 mL Intracatheter PRN Sharmon Cheramie, GVirgie Dad Autumn Bullock        OBJECTIVE: White woman in no acute distress  Vitals:   12/09/20 1403  BP: 118/69  Pulse: 66  Resp: 18  Temp: (!) 97.5 F (36.4 C)  SpO2: 100%     Body mass index is 28.16 kg/m.   Wt Readings from Last 3 Encounters:  12/09/20 169  lb 3.2 oz (76.7 kg)  11/26/20 164 lb 14.4 oz (74.8 kg)  11/25/20 165 lb 3.2 oz (74.9 kg)     ECOG FS:1 - Symptomatic but completely ambulatory  Glabrous Sclerae unicteric, EOMs intact Wearing a mask No cervical or supraclavicular adenopathy Lungs no rales or rhonchi Heart regular rate and rhythm Abd soft, nontender, positive bowel sounds MSK no focal spinal tenderness, no upper extremity lymphedema Neuro: nonfocal, well oriented, appropriate affect Breasts: Deferred   LAB RESULTS:  CMP     Component Value Date/Time   NA 139 12/09/2020 1358   K 4.0 12/09/2020 1358   CL 106 12/09/2020 1358   CO2 23 12/09/2020 1358   GLUCOSE 107 (H) 12/09/2020 1358   BUN 15 12/09/2020 1358   CREATININE 0.92 12/09/2020 1358   CREATININE 0.73 08/17/2020 0905   CALCIUM 8.4 (L) 12/09/2020 1358   PROT 6.6 12/09/2020 1358   ALBUMIN 4.1 12/09/2020 1358   AST 33 12/09/2020 1358   AST 18 08/17/2020 0905   ALT 44 12/09/2020 1358   ALT 18 08/17/2020 0905   ALKPHOS 60 12/09/2020 1358   BILITOT 0.4 12/09/2020 1358   BILITOT 0.7 08/17/2020 0905   GFRNONAA >60 12/09/2020 1358   GFRNONAA >60 08/17/2020 0905    No results found for: TOTALPROTELP, ALBUMINELP, A1GS, A2GS, BETS, BETA2SER, GAMS, MSPIKE, SPEI  Lab Results  Component Value Date   WBC 2.5 (L) 12/09/2020   NEUTROABS 1.2 (L) 12/09/2020   HGB 10.3 (L) 12/09/2020   HCT 29.9 (L) 12/09/2020   MCV 99.7 12/09/2020   PLT 373 12/09/2020    No results found for: LABCA2  No components found for: LEXBMWU132 No results for input(s): INR in the last 168 hours.  No results found for: LABCA2  No results found for: CGMW102 No results found for: CVOZ366 No results found for: CYQI347 No results found for: CA2729  No components found for: HGQUANT  No results found for: CEA1 / No results found for: CEA1   No results found for: AFPTUMOR  No results found for: CHROMOGRNA  No results found for: KPAFRELGTCHN, LAMBDASER,  KAPLAMBRATIO (kappa/lambda light chains)  No results found for: HGBA, HGBA2QUANT, HGBFQUANT, HGBSQUAN (Hemoglobinopathy evaluation)   No results found for: LDH  No results found for: IRON, TIBC, IRONPCTSAT (Iron and TIBC)  No results found for: FERRITIN  Urinalysis    Component Value Date/Time   COLORURINE YELLOW 09/02/2020 0843   APPEARANCEUR HAZY (A) 09/02/2020 0843   LABSPEC 1.010 09/02/2020 0843   PHURINE 6.0 09/02/2020 0843   GLUCOSEU NEGATIVE 09/02/2020 0843   HGBUR SMALL (  A) 09/02/2020 [place] 09/02/2020 [place] 09/02/2020 0843   PROTEINUR NEGATIVE 09/02/2020 0843   NITRITE NEGATIVE 09/02/2020 0843   LEUKOCYTESUR TRACE (A) 09/02/2020 0843    STUDIES: No results found.   ELIGIBLE FOR AVAILABLE RESEARCH PROTOCOL: AET  ASSESSMENT: 45 y.o. [place] woman status post right breast upper outer quadrant biopsy 06/08/2020 for a clinical mT2 N0, stage IB invasive ductal carcinoma, grade 2, estrogen and progesterone receptor positive, HER-2 not amplified, with an MIB-1 of 5%.  (1) genetics testing 06/22/2020 through the Common Hereditary Cancers Panel offered by [facility] found no deleterious mutations in APC, ATM, AXIN2, BARD1, BMPR1A, BRCA1, BRCA2, BRIP1, CDH1, CDK4, CDKN2A (p14ARF), CDKN2A (p16INK4a), CHEK2, CTNNA1, DICER1, EPCAM (Deletion/duplication testing only), GREM1 (promoter region deletion/duplication testing only), KIT, MEN1, MLH1, MSH2, MSH3, MSH6, MUTYH, NBN, NF1, NHTL1, PALB2, PDGFRA, PMS2, POLD1, POLE, PTEN, RAD50, RAD51C, RAD51D, RNF43, SDHB, SDHC, SDHD, SMAD4, SMARCA4. STK11, TP53, TSC1, TSC2, and VHL.  The following genes were evaluated for sequence changes only: SDHA and HOXB13 c.251G>A variant only.  (a)  a variant of uncertain significance in MSH3 at c.2731T>G (p.Leu911Val).   (2) right lumpectomy and sentinel lymph node sampling 07/14/2020 showed a pT2 pN1(mic), stage IIA invasive ductal carcinoma, grade 2, with a  positive superior margin  (a) additional surgery 08/03/2020  (3) MammaPrint obtained from the initial biopsy showed high risk, predicting a 93% 5-year disease-free survival with chemotherapy and significant benefit from chemotherapy (greater than 12%).  (4) cyclophosphamide and doxorubicin in dose dense fashion x4 started 10/15/2020 08/17/2020, completed 09/28/2020, followed by paclitaxel weekly x12 started  (a) echo 07/09/2020 shows an ejection fraction in the 60-65% range.  (5) adjuvant radiation to follow  (6) antiestrogens to start at the completion of local treatment   PLAN: Akasha is tolerating her weekly paclitaxel well and we are proceeding with the ninth of 12 doses today.  It is very gratifying that she has had no peripheral neuropathy.  Her hair has not started to grow back but then I have had no patients on paclitaxel who did not have eventual full heads of hair so I am hoping for that to happen to her soon.  She is doing cryo prevention which I encouraged her to continue.  I am glad that she has more energy now at work but I discouraged her from going back to work full-time.  When she is done with chemo she will start radiation which will also make her fatigued.  I think it would be best if she continued part-time until the end of the semester and then go back full-time in the fall.  It is of course her decision ultimately  She has appointments for last 3 treatments but no visits for the next 2.  I have asked her to please not get treated if she has any peripheral neuropathy symptoms but asked to see me first.  Otherwise I will see her on her last day of treatment and make sure she is set up for follow-up radiation  Total encounter time 25 minutes.Autumn Jews C. Shirl [place], Autumn Bullock 12/09/20 2:51 PM Medical Oncology and Hematology [facility] [place], [place] [address] Tel. [phone]    Fax. [phone]   I, Autumn Bullock, am acting as scribe  for Dr. Virgie Bullock. Ivyonna Hoelzel.  I, Lurline Del Autumn Bullock, have reviewed the above documentation for accuracy and completeness, and I agree with the above.    *Total Encounter Time as  defined by the Centers for Medicare and Medicaid Services includes, in addition to the face-to-face time of a patient visit (documented in the note above) non-face-to-face time: obtaining and reviewing outside history, ordering and reviewing medications, tests or procedures, care coordination (communications with other health care professionals or caregivers) and documentation in the medical record.

## 2020-12-09 ENCOUNTER — Inpatient Hospital Stay: Payer: BC Managed Care – PPO

## 2020-12-09 ENCOUNTER — Encounter: Payer: Self-pay | Admitting: *Deleted

## 2020-12-09 ENCOUNTER — Other Ambulatory Visit: Payer: Self-pay

## 2020-12-09 ENCOUNTER — Other Ambulatory Visit: Payer: Self-pay | Admitting: *Deleted

## 2020-12-09 ENCOUNTER — Inpatient Hospital Stay: Payer: BC Managed Care – PPO | Admitting: Oncology

## 2020-12-09 ENCOUNTER — Inpatient Hospital Stay: Payer: BC Managed Care – PPO | Attending: Oncology

## 2020-12-09 VITALS — BP 118/69 | HR 66 | Temp 97.5°F | Resp 18 | Ht 65.0 in | Wt 169.2 lb

## 2020-12-09 DIAGNOSIS — Z170 Estrogen receptor positive status [ER+]: Secondary | ICD-10-CM | POA: Diagnosis not present

## 2020-12-09 DIAGNOSIS — C50411 Malignant neoplasm of upper-outer quadrant of right female breast: Secondary | ICD-10-CM

## 2020-12-09 DIAGNOSIS — Z5111 Encounter for antineoplastic chemotherapy: Secondary | ICD-10-CM | POA: Diagnosis present

## 2020-12-09 DIAGNOSIS — Z95828 Presence of other vascular implants and grafts: Secondary | ICD-10-CM

## 2020-12-09 DIAGNOSIS — R5383 Other fatigue: Secondary | ICD-10-CM | POA: Insufficient documentation

## 2020-12-09 DIAGNOSIS — L719 Rosacea, unspecified: Secondary | ICD-10-CM | POA: Insufficient documentation

## 2020-12-09 LAB — COMPREHENSIVE METABOLIC PANEL
ALT: 44 U/L (ref 0–44)
AST: 33 U/L (ref 15–41)
Albumin: 4.1 g/dL (ref 3.5–5.0)
Alkaline Phosphatase: 60 U/L (ref 38–126)
Anion gap: 10 (ref 5–15)
BUN: 15 mg/dL (ref 6–20)
CO2: 23 mmol/L (ref 22–32)
Calcium: 8.4 mg/dL — ABNORMAL LOW (ref 8.9–10.3)
Chloride: 106 mmol/L (ref 98–111)
Creatinine, Ser: 0.92 mg/dL (ref 0.44–1.00)
GFR, Estimated: >60 mL/min (ref >60)
Glucose, Bld: 107 mg/dL — ABNORMAL HIGH (ref 70–99)
Potassium: 4 mmol/L (ref 3.5–5.1)
Sodium: 139 mmol/L (ref 135–145)
Total Bilirubin: 0.4 mg/dL (ref 0.3–1.2)
Total Protein: 6.6 g/dL (ref 6.5–8.1)

## 2020-12-09 LAB — CBC WITH DIFFERENTIAL/PLATELET
Abs Immature Granulocytes: 0.02 10*3/uL (ref 0.00–0.07)
Basophils Absolute: 0 10*3/uL (ref 0.0–0.1)
Basophils Relative: 1 %
Eosinophils Absolute: 0.1 10*3/uL (ref 0.0–0.5)
Eosinophils Relative: 4 %
HCT: 29.9 % — ABNORMAL LOW (ref 36.0–46.0)
Hemoglobin: 10.3 g/dL — ABNORMAL LOW (ref 12.0–15.0)
Immature Granulocytes: 1 %
Lymphocytes Relative: 32 %
Lymphs Abs: 0.8 10*3/uL (ref 0.7–4.0)
MCH: 34.3 pg — ABNORMAL HIGH (ref 26.0–34.0)
MCHC: 34.4 g/dL (ref 30.0–36.0)
MCV: 99.7 fL (ref 80.0–100.0)
Monocytes Absolute: 0.4 10*3/uL (ref 0.1–1.0)
Monocytes Relative: 14 %
Neutro Abs: 1.2 10*3/uL — ABNORMAL LOW (ref 1.7–7.7)
Neutrophils Relative %: 48 %
Platelets: 373 10*3/uL (ref 150–400)
RBC: 3 MIL/uL — ABNORMAL LOW (ref 3.87–5.11)
RDW: 14.4 % (ref 11.5–15.5)
WBC: 2.5 10*3/uL — ABNORMAL LOW (ref 4.0–10.5)
nRBC: 0 % (ref 0.0–0.2)

## 2020-12-09 LAB — PREGNANCY, URINE: Preg Test, Ur: NEGATIVE

## 2020-12-09 MED ORDER — DEXAMETHASONE SODIUM PHOSPHATE 10 MG/ML IJ SOLN
4.0000 mg | Freq: Once | INTRAMUSCULAR | Status: AC
  Administered 2020-12-09: 4 mg via INTRAVENOUS

## 2020-12-09 MED ORDER — FAMOTIDINE IN NACL 20-0.9 MG/50ML-% IV SOLN
INTRAVENOUS | Status: AC
  Filled 2020-12-09: qty 50

## 2020-12-09 MED ORDER — PACLITAXEL CHEMO INJECTION 300 MG/50ML
80.0000 mg/m2 | Freq: Once | INTRAVENOUS | Status: AC
  Administered 2020-12-09: 144 mg via INTRAVENOUS
  Filled 2020-12-09: qty 24

## 2020-12-09 MED ORDER — HEPARIN SOD (PORK) LOCK FLUSH 100 UNIT/ML IV SOLN
500.0000 [IU] | Freq: Once | INTRAVENOUS | Status: AC | PRN
  Administered 2020-12-09: 500 [IU]
  Filled 2020-12-09: qty 5

## 2020-12-09 MED ORDER — SODIUM CHLORIDE 0.9 % IV SOLN
Freq: Once | INTRAVENOUS | Status: AC
  Filled 2020-12-09: qty 250

## 2020-12-09 MED ORDER — SODIUM CHLORIDE 0.9% FLUSH
10.0000 mL | Freq: Once | INTRAVENOUS | Status: AC
  Administered 2020-12-09: 10 mL
  Filled 2020-12-09: qty 10

## 2020-12-09 MED ORDER — FAMOTIDINE IN NACL 20-0.9 MG/50ML-% IV SOLN
20.0000 mg | Freq: Once | INTRAVENOUS | Status: AC
  Administered 2020-12-09: 20 mg via INTRAVENOUS

## 2020-12-09 MED ORDER — DEXAMETHASONE SODIUM PHOSPHATE 10 MG/ML IJ SOLN
INTRAMUSCULAR | Status: AC
  Filled 2020-12-09: qty 1

## 2020-12-09 MED ORDER — LORAZEPAM 2 MG/ML IJ SOLN: 0.5000 mg | Freq: Once | INTRAMUSCULAR | Status: DC

## 2020-12-09 MED ORDER — SODIUM CHLORIDE 0.9% FLUSH
10.0000 mL | INTRAVENOUS | Status: DC | PRN
  Administered 2020-12-09: 10 mL
  Filled 2020-12-09: qty 10

## 2020-12-09 NOTE — Patient Instructions (Signed)
Implanted Port Insertion, Care After This sheet gives you information about how to care for yourself after your procedure. Your health care provider may also give you more specific instructions. If you have problems or questions, contact your health care provider. What can I expect after the procedure? After the procedure, it is common to have:  Discomfort at the port insertion site.  Bruising on the skin over the port. This should improve over 3-4 days. Follow these instructions at home: Port care  After your port is placed, you will get a manufacturer's information card. The card has information about your port. Keep this card with you at all times.  Take care of the port as told by your health care provider. Ask your health care provider if you or a family member can get training for taking care of the port at home. A home health care nurse may also take care of the port.  Make sure to remember what type of port you have. Incision care  Follow instructions from your health care provider about how to take care of your port insertion site. Make sure you: ? Wash your hands with soap and water before and after you change your bandage (dressing). If soap and water are not available, use hand sanitizer. ? Change your dressing as told by your health care provider. ? Leave stitches (sutures), skin glue, or adhesive strips in place. These skin closures may need to stay in place for 2 weeks or longer. If adhesive strip edges start to loosen and curl up, you may trim the loose edges. Do not remove adhesive strips completely unless your health care provider tells you to do that.  Check your port insertion site every day for signs of infection. Check for: ? Redness, swelling, or pain. ? Fluid or blood. ? Warmth. ? Pus or a bad smell.      Activity  Return to your normal activities as told by your health care provider. Ask your health care provider what activities are safe for you.  Do not  lift anything that is heavier than 10 lb (4.5 kg), or the limit that you are told, until your health care provider says that it is safe. General instructions  Take over-the-counter and prescription medicines only as told by your health care provider.  Do not take baths, swim, or use a hot tub until your health care provider approves. Ask your health care provider if you may take showers. You may only be allowed to take sponge baths.  Do not drive for 24 hours if you were given a sedative during your procedure.  Wear a medical alert bracelet in case of an emergency. This will tell any health care providers that you have a port.  Keep all follow-up visits as told by your health care provider. This is important. Contact a health care provider if:  You cannot flush your port with saline as directed, or you cannot draw blood from the port.  You have a fever or chills.  You have redness, swelling, or pain around your port insertion site.  You have fluid or blood coming from your port insertion site.  Your port insertion site feels warm to the touch.  You have pus or a bad smell coming from the port insertion site. Get help right away if:  You have chest pain or shortness of breath.  You have bleeding from your port that you cannot control. Summary  Take care of the port as told by your   health care provider. Keep the manufacturer's information card with you at all times.  Change your dressing as told by your health care provider.  Contact a health care provider if you have a fever or chills or if you have redness, swelling, or pain around your port insertion site.  Keep all follow-up visits as told by your health care provider. This information is not intended to replace advice given to you by your health care provider. Make sure you discuss any questions you have with your health care provider. Document Revised: 03/19/2018 Document Reviewed: 03/19/2018 Elsevier Patient Education   2021 Elsevier Inc.  

## 2020-12-09 NOTE — Progress Notes (Signed)
OK to treat per Dr. Noreene Filbert w/ [place] 1.2

## 2020-12-16 ENCOUNTER — Encounter: Payer: Self-pay | Admitting: *Deleted

## 2020-12-16 ENCOUNTER — Encounter: Payer: Self-pay | Admitting: Oncology

## 2020-12-16 ENCOUNTER — Other Ambulatory Visit: Payer: Self-pay | Admitting: *Deleted

## 2020-12-16 ENCOUNTER — Inpatient Hospital Stay: Payer: BC Managed Care – PPO

## 2020-12-16 ENCOUNTER — Other Ambulatory Visit: Payer: Self-pay

## 2020-12-16 VITALS — BP 114/87 | HR 75 | Temp 98.2°F | Resp 18

## 2020-12-16 DIAGNOSIS — C50411 Malignant neoplasm of upper-outer quadrant of right female breast: Secondary | ICD-10-CM

## 2020-12-16 DIAGNOSIS — Z170 Estrogen receptor positive status [ER+]: Secondary | ICD-10-CM

## 2020-12-16 DIAGNOSIS — Z5111 Encounter for antineoplastic chemotherapy: Secondary | ICD-10-CM | POA: Diagnosis not present

## 2020-12-16 LAB — PREGNANCY, URINE: Preg Test, Ur: NEGATIVE

## 2020-12-16 LAB — CBC WITH DIFFERENTIAL/PLATELET
Abs Immature Granulocytes: 0.02 10*3/uL (ref 0.00–0.07)
Basophils Absolute: 0 10*3/uL (ref 0.0–0.1)
Basophils Relative: 1 %
Eosinophils Absolute: 0.1 10*3/uL (ref 0.0–0.5)
Eosinophils Relative: 2 %
HCT: 29.9 % — ABNORMAL LOW (ref 36.0–46.0)
Hemoglobin: 10.5 g/dL — ABNORMAL LOW (ref 12.0–15.0)
Immature Granulocytes: 1 %
Lymphocytes Relative: 27 %
Lymphs Abs: 1 10*3/uL (ref 0.7–4.0)
MCH: 34.3 pg — ABNORMAL HIGH (ref 26.0–34.0)
MCHC: 35.1 g/dL (ref 30.0–36.0)
MCV: 97.7 fL (ref 80.0–100.0)
Monocytes Absolute: 0.3 10*3/uL (ref 0.1–1.0)
Monocytes Relative: 8 %
Neutro Abs: 2.2 10*3/uL (ref 1.7–7.7)
Neutrophils Relative %: 61 %
Platelets: 378 10*3/uL (ref 150–400)
RBC: 3.06 MIL/uL — ABNORMAL LOW (ref 3.87–5.11)
RDW: 13.7 % (ref 11.5–15.5)
WBC: 3.6 10*3/uL — ABNORMAL LOW (ref 4.0–10.5)
nRBC: 0 % (ref 0.0–0.2)

## 2020-12-16 LAB — COMPREHENSIVE METABOLIC PANEL
ALT: 53 U/L — ABNORMAL HIGH (ref 0–44)
AST: 40 U/L (ref 15–41)
Albumin: 4.3 g/dL (ref 3.5–5.0)
Alkaline Phosphatase: 66 U/L (ref 38–126)
Anion gap: 8 (ref 5–15)
BUN: 15 mg/dL (ref 6–20)
CO2: 25 mmol/L (ref 22–32)
Calcium: 8.6 mg/dL — ABNORMAL LOW (ref 8.9–10.3)
Chloride: 106 mmol/L (ref 98–111)
Creatinine, Ser: 0.73 mg/dL (ref 0.44–1.00)
GFR, Estimated: >60 mL/min (ref >60)
Glucose, Bld: 96 mg/dL (ref 70–99)
Potassium: 4.1 mmol/L (ref 3.5–5.1)
Sodium: 139 mmol/L (ref 135–145)
Total Bilirubin: 0.3 mg/dL (ref 0.3–1.2)
Total Protein: 7.1 g/dL (ref 6.5–8.1)

## 2020-12-16 MED ORDER — LORAZEPAM 2 MG/ML IJ SOLN: 0.5000 mg | Freq: Once | INTRAMUSCULAR | Status: DC

## 2020-12-16 MED ORDER — FAMOTIDINE IN NACL 20-0.9 MG/50ML-% IV SOLN
INTRAVENOUS | Status: AC
  Filled 2020-12-16: qty 50

## 2020-12-16 MED ORDER — HEPARIN SOD (PORK) LOCK FLUSH 100 UNIT/ML IV SOLN
500.0000 [IU] | Freq: Once | INTRAVENOUS | Status: AC | PRN
  Administered 2020-12-16: 500 [IU]
  Filled 2020-12-16: qty 5

## 2020-12-16 MED ORDER — DEXAMETHASONE SODIUM PHOSPHATE 10 MG/ML IJ SOLN
INTRAMUSCULAR | Status: AC
  Filled 2020-12-16: qty 1

## 2020-12-16 MED ORDER — FAMOTIDINE IN NACL 20-0.9 MG/50ML-% IV SOLN
20.0000 mg | Freq: Once | INTRAVENOUS | Status: AC
  Administered 2020-12-16: 20 mg via INTRAVENOUS

## 2020-12-16 MED ORDER — SODIUM CHLORIDE 0.9 % IV SOLN
Freq: Once | INTRAVENOUS | Status: AC
  Filled 2020-12-16: qty 250

## 2020-12-16 MED ORDER — SODIUM CHLORIDE 0.9% FLUSH
10.0000 mL | INTRAVENOUS | Status: DC | PRN
  Administered 2020-12-16: 10 mL
  Filled 2020-12-16: qty 10

## 2020-12-16 MED ORDER — DEXAMETHASONE SODIUM PHOSPHATE 10 MG/ML IJ SOLN
4.0000 mg | Freq: Once | INTRAMUSCULAR | Status: AC
  Administered 2020-12-16: 4 mg via INTRAVENOUS

## 2020-12-16 MED ORDER — SODIUM CHLORIDE 0.9 % IV SOLN
80.0000 mg/m2 | Freq: Once | INTRAVENOUS | Status: AC
  Administered 2020-12-16: 144 mg via INTRAVENOUS
  Filled 2020-12-16: qty 24

## 2020-12-16 NOTE — Patient Instructions (Signed)
[facility] Discharge Instructions for Patients Receiving Chemotherapy  Today you received the following chemotherapy agents: Paclitaxel (Taxol)  To help prevent nausea and vomiting after your treatment, we encourage you to take your nausea medication as directed by your provider.   If you develop nausea and vomiting that is not controlled by your nausea medication, call the clinic.   BELOW ARE SYMPTOMS THAT SHOULD BE REPORTED IMMEDIATELY:  *FEVER GREATER THAN 100.5 F  *CHILLS WITH OR WITHOUT FEVER  NAUSEA AND VOMITING THAT IS NOT CONTROLLED WITH YOUR NAUSEA MEDICATION  *UNUSUAL SHORTNESS OF BREATH  *UNUSUAL BRUISING OR BLEEDING  TENDERNESS IN MOUTH AND THROAT WITH OR WITHOUT PRESENCE OF ULCERS  *URINARY PROBLEMS  *BOWEL PROBLEMS  UNUSUAL RASH Items with * indicate a potential emergency and should be followed up as soon as possible.  Feel free to call the clinic should you have any questions or concerns. The clinic phone number is [phone].  Please show the [facility] at check-in to the Emergency Department and triage nurse.

## 2020-12-20 ENCOUNTER — Telehealth: Payer: Self-pay | Admitting: Oncology

## 2020-12-20 NOTE — Telephone Encounter (Signed)
Patient called notified.

## 2020-12-24 ENCOUNTER — Inpatient Hospital Stay: Payer: BC Managed Care – PPO

## 2020-12-24 ENCOUNTER — Other Ambulatory Visit: Payer: Self-pay

## 2020-12-24 VITALS — BP 114/67 | HR 60 | Temp 98.6°F | Resp 18

## 2020-12-24 DIAGNOSIS — Z5111 Encounter for antineoplastic chemotherapy: Secondary | ICD-10-CM | POA: Diagnosis not present

## 2020-12-24 DIAGNOSIS — D649 Anemia, unspecified: Secondary | ICD-10-CM

## 2020-12-24 DIAGNOSIS — Z170 Estrogen receptor positive status [ER+]: Secondary | ICD-10-CM

## 2020-12-24 DIAGNOSIS — C50411 Malignant neoplasm of upper-outer quadrant of right female breast: Secondary | ICD-10-CM

## 2020-12-24 DIAGNOSIS — Z95828 Presence of other vascular implants and grafts: Secondary | ICD-10-CM

## 2020-12-24 LAB — CBC WITH DIFFERENTIAL/PLATELET
Abs Immature Granulocytes: 0.03 10*3/uL (ref 0.00–0.07)
Basophils Absolute: 0 10*3/uL (ref 0.0–0.1)
Basophils Relative: 1 %
Eosinophils Absolute: 0.1 10*3/uL (ref 0.0–0.5)
Eosinophils Relative: 3 %
HCT: 31.8 % — ABNORMAL LOW (ref 36.0–46.0)
Hemoglobin: 10.8 g/dL — ABNORMAL LOW (ref 12.0–15.0)
Immature Granulocytes: 1 %
Lymphocytes Relative: 32 %
Lymphs Abs: 0.9 10*3/uL (ref 0.7–4.0)
MCH: 33.9 pg (ref 26.0–34.0)
MCHC: 34 g/dL (ref 30.0–36.0)
MCV: 99.7 fL (ref 80.0–100.0)
Monocytes Absolute: 0.4 10*3/uL (ref 0.1–1.0)
Monocytes Relative: 13 %
Neutro Abs: 1.5 10*3/uL — ABNORMAL LOW (ref 1.7–7.7)
Neutrophils Relative %: 50 %
Platelets: 315 10*3/uL (ref 150–400)
RBC: 3.19 MIL/uL — ABNORMAL LOW (ref 3.87–5.11)
RDW: 13.6 % (ref 11.5–15.5)
WBC: 3 10*3/uL — ABNORMAL LOW (ref 4.0–10.5)
nRBC: 0 % (ref 0.0–0.2)

## 2020-12-24 LAB — COMPREHENSIVE METABOLIC PANEL
ALT: 36 U/L (ref 0–44)
AST: 32 U/L (ref 15–41)
Albumin: 4.1 g/dL (ref 3.5–5.0)
Alkaline Phosphatase: 59 U/L (ref 38–126)
Anion gap: 7 (ref 5–15)
BUN: 13 mg/dL (ref 6–20)
CO2: 25 mmol/L (ref 22–32)
Calcium: 8.8 mg/dL — ABNORMAL LOW (ref 8.9–10.3)
Chloride: 105 mmol/L (ref 98–111)
Creatinine, Ser: 0.68 mg/dL (ref 0.44–1.00)
GFR, Estimated: >60 mL/min (ref >60)
Glucose, Bld: 110 mg/dL — ABNORMAL HIGH (ref 70–99)
Potassium: 4 mmol/L (ref 3.5–5.1)
Sodium: 137 mmol/L (ref 135–145)
Total Bilirubin: 0.3 mg/dL (ref 0.3–1.2)
Total Protein: 6.7 g/dL (ref 6.5–8.1)

## 2020-12-24 LAB — PREGNANCY, URINE: Preg Test, Ur: NEGATIVE

## 2020-12-24 MED ORDER — FAMOTIDINE IN NACL 20-0.9 MG/50ML-% IV SOLN
INTRAVENOUS | Status: AC
  Filled 2020-12-24: qty 50

## 2020-12-24 MED ORDER — LORAZEPAM 2 MG/ML IJ SOLN: 0.5000 mg | Freq: Once | INTRAMUSCULAR | Status: DC

## 2020-12-24 MED ORDER — DEXAMETHASONE SODIUM PHOSPHATE 10 MG/ML IJ SOLN
4.0000 mg | Freq: Once | INTRAMUSCULAR | Status: AC
  Administered 2020-12-24: 4 mg via INTRAVENOUS

## 2020-12-24 MED ORDER — SODIUM CHLORIDE 0.9 % IV SOLN
80.0000 mg/m2 | Freq: Once | INTRAVENOUS | Status: AC
  Administered 2020-12-24: 144 mg via INTRAVENOUS
  Filled 2020-12-24: qty 24

## 2020-12-24 MED ORDER — SODIUM CHLORIDE 0.9% FLUSH
10.0000 mL | Freq: Once | INTRAVENOUS | Status: AC
  Administered 2020-12-24: 10 mL
  Filled 2020-12-24: qty 10

## 2020-12-24 MED ORDER — SODIUM CHLORIDE 0.9% FLUSH
10.0000 mL | INTRAVENOUS | Status: DC | PRN
  Administered 2020-12-24: 10 mL
  Filled 2020-12-24: qty 10

## 2020-12-24 MED ORDER — HEPARIN SOD (PORK) LOCK FLUSH 100 UNIT/ML IV SOLN
500.0000 [IU] | Freq: Once | INTRAVENOUS | Status: AC | PRN
  Administered 2020-12-24: 500 [IU]
  Filled 2020-12-24: qty 5

## 2020-12-24 MED ORDER — SODIUM CHLORIDE 0.9 % IV SOLN
Freq: Once | INTRAVENOUS | Status: AC
Start: 2020-12-24 — End: 2020-12-24
  Filled 2020-12-24: qty 250

## 2020-12-24 MED ORDER — DEXAMETHASONE SODIUM PHOSPHATE 10 MG/ML IJ SOLN
INTRAMUSCULAR | Status: AC
  Filled 2020-12-24: qty 1

## 2020-12-24 MED ORDER — FAMOTIDINE IN NACL 20-0.9 MG/50ML-% IV SOLN
20.0000 mg | Freq: Once | INTRAVENOUS | Status: AC
  Administered 2020-12-24: 20 mg via INTRAVENOUS

## 2020-12-24 MED ORDER — LORAZEPAM 2 MG/ML IJ SOLN
INTRAMUSCULAR | Status: AC
  Filled 2020-12-24: qty 1

## 2020-12-24 NOTE — Patient Instructions (Signed)
[facility] ONCOLOG[facility]  Discharge Instructions: Thank you for choosing [facility] to provide your oncology and hematology care.   If you have a lab appointment with the [facility], please go directly to the [facility] and check in at the registration area.   Wear comfortable clothing and clothing appropriate for easy access to any Portacath or PICC line.   We strive to give you quality time with your provider. You may need to reschedule your appointment if you arrive late (15 or more minutes).  Arriving late affects you and other patients whose appointments are after yours.  Also, if you miss three or more appointments without notifying the office, you may be dismissed from the clinic at the provider's discretion.      For prescription refill requests, have your pharmacy contact our office and allow 72 hours for refills to be completed.    Today you received the following chemotherapy and/or immunotherapy agents: Paclitaxel (Taxol)     To help prevent nausea and vomiting after your treatment, we encourage you to take your nausea medication as directed.  BELOW ARE SYMPTOMS THAT SHOULD BE REPORTED IMMEDIATELY: . *FEVER GREATER THAN 100.4 F (38 C) OR HIGHER . *CHILLS OR SWEATING . *NAUSEA AND VOMITING THAT IS NOT CONTROLLED WITH YOUR NAUSEA MEDICATION . *UNUSUAL SHORTNESS OF BREATH . *UNUSUAL BRUISING OR BLEEDING . *URINARY PROBLEMS (pain or burning when urinating, or frequent urination) . *BOWEL PROBLEMS (unusual diarrhea, constipation, pain near the anus) . TENDERNESS IN MOUTH AND THROAT WITH OR WITHOUT PRESENCE OF ULCERS (sore throat, sores in mouth, or a toothache) . UNUSUAL RASH, SWELLING OR PAIN  . UNUSUAL VAGINAL DISCHARGE OR ITCHING   Items with * indicate a potential emergency and should be followed up as soon as possible or go to the Emergency Department if any problems should occur.  Please show the CHEMOTHERAPY ALERT CARD or  IMMUNOTHERAPY ALERT CARD at check-in to the Emergency Department and triage nurse.  Should you have questions after your visit or need to cancel or reschedule your appointment, please contact [facility]  Dept: [phone]  and follow the prompts.  Office hours are 8:00 a.m. to 4:30 p.m. Monday - Friday. Please note that voicemails left after 4:00 p.m. may not be returned until the following business day.  We are closed weekends and major holidays. You have access to a nurse at all times for urgent questions. Please call the main number to the clinic Dept: [phone] and follow the prompts.   For any non-urgent questions, you may also contact your provider using MyChart. We now offer e-Visits for anyone 86 and older to request care online for non-urgent symptoms. For details visit [email or web address].   Also download the MyChart app! Go to the app store, search "MyChart", open the app, select [facility], and log in with your MyChart username and password.  Due to Covid, a mask is required upon entering the hospital/clinic. If you do not have a mask, one will be given to you upon arrival. For doctor visits, patients may have 1 support person aged 81 or older with them. For treatment visits, patients cannot have anyone with them due to current Covid guidelines and our immunocompromised population.

## 2020-12-28 ENCOUNTER — Encounter: Payer: Self-pay | Admitting: *Deleted

## 2020-12-29 NOTE — Progress Notes (Signed)
Autumn Bullock  [phone] [phone]     ID: Autumn Bullock DOB: 04/09/1976  MR#: [id number]  JIR#:[id number]  Patient Care Team: Brock Ra, PA-C as PCP - General Mauro Kaufmann, RN as Oncology Nurse Navigator Rockwell Germany, RN as Oncology Nurse Navigator Donnie Mesa, MD as Consulting Physician (General Surgery) Eppie Gibson, MD as Attending Physician (Radiation Oncology) Hadasa Gasner, Virgie Dad, MD as Consulting Physician (Oncology) Vanessa Kick, MD as Consulting Physician (Obstetrics and Gynecology) Lovett Calender, MD as Consulting Physician (Orthopedic Surgery) Chauncey Cruel, MD OTHER MD:  CHIEF COMPLAINT: Estrogen receptor positive breast cancer  CURRENT TREATMENT: Adjuvant chemotherapy   INTERVAL HISTORY: Autumn Bullock returns today for follow up and treatment of her estrogen receptor positive breast cancer.    She continues on weekly Paclitaxel.  Today is week 12 of treatment, her final dose.  She has absolutely no peripheral neuropathy symptoms (on very close questioning).  She is not aware of any other side effects other than mild fatigue  She is scheduled to meet with Dr. Isidore Moos tomorrow, 12/31/2020.   REVIEW OF SYSTEMS: Autumn Bullock still has rosacea issues.  This is exacerbated by the steroids she gets and premeds.  Hopefully this will improve when she goes off treatment.  She is taking MetroGel daily.  And that helps.  She found that the Effexor at very low dose was sufficient to control the hot flashes and she does not wish to increase that dose.  She is having some issues with HR at work which are difficult for me to understand but hopefully she will get that straightened out very soon.  A detailed review of systems today was stable.   COVID 19 VACCINATION STATUS: Status post Pfizer x2, most recent treatment April 2021.  Patient also was diagnosed with COVID-19 disease November 2020   HISTORY OF CURRENT ILLNESS: From the original  intake note:  Autumn Bullock had routine screening mammography on 06/02/2020 showing a possible abnormality in the right breast. She underwent right diagnostic mammography with tomography and right breast ultrasonography at [facility] on 06/08/2020 showing: breast density category C; 2.3 cm right breast mass at 11:30; 0.8 cm mass superficial to dominant mass; no right axillary adenopathy.  Accordingly on 06/08/2020 she proceeded to biopsy of the right breast area in question. The pathology from this procedure ([id number]) showed: invasive and in situ mammary carcinoma, grade 2, e-cadherin positive. Both biopsied masses showed this and were found to be morphologically similar. Prognostic indicators significant for: estrogen receptor, 90% positive and progesterone receptor, 70% positive, both with strong staining intensity. Proliferation marker Ki67 at 5%. HER2 negative by immunohistochemistry (1+).  The patient's subsequent history is as detailed below.   PAST MEDICAL HISTORY: Past Medical History:  Diagnosis Date  . Family history of uterine cancer 06/16/2020  . PONV (postoperative nausea and vomiting)     PAST SURGICAL HISTORY: Past Surgical History:  Procedure Laterality Date  . BREAST LUMPECTOMY WITH RADIOACTIVE SEED AND SENTINEL LYMPH NODE BIOPSY Right 07/14/2020   Procedure: RIGHT BREAST LUMPECTOMY WITH BRACKETED RADIOACTIVE SEED AND SENTINEL LYMPH NODE BIOPSY, BLUE DYE INJECTION;  Surgeon: Donnie Mesa, MD;  Location: [facility];  Service: General;  Laterality: Right;  PEC BLOCK, BLUE DYE INJECTION  . CESAREAN SECTION     2011  . PORTACATH PLACEMENT Right 07/14/2020   Procedure: INSERTION PORT-A-CATH WITH ULTRASOUND GUIDANCE;  Surgeon: Donnie Mesa, MD;  Location: [facility];  Service: General;  Laterality: Right;  . RE-EXCISION  OF BREAST LUMPECTOMY Right 08/03/2020   Procedure: RE-EXCISION OF RIGHT BREAST LUMPECTOMY SUPERIOR MARGINS;  Surgeon: Donnie Mesa, MD;  Location: [facility];  Service: General;  Laterality: Right;  LMA    FAMILY HISTORY: Family History  Problem Relation Age of Onset  . Cancer Maternal Grandmother 20       Unknown GYN.  ?Uterine  As of October 2021 her parents are both living, her father at age 19 and her mother at 66, . Crystallee has 1 brother and 2 sisters. She reports uterine cancer in her maternal grandmother at age 54. There is no family history of breast, ovarian, or colon cancer to her knowledge.   GYNECOLOGIC HISTORY:  No LMP recorded. Menarche: 45 years old Age at first live birth: 45 years old [place] P 1 LMP 05/05/2020, regular monthly periods, lasting 3 days with 1-2 heavy days Contraceptive: has used for 15 years, no complications HRT n/a  Hysterectomy? no BSO? 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 Husband Autumn Bullock works in Nature conservation officer. She lives at home with Autumn Bullock and their daughter Autumn Bullock, age 5. She attends [employer] in [place].    ADVANCED DIRECTIVES: In the absence of any documentation to the contrary, the patient's spouse is their HCPOA.    HEALTH MAINTENANCE: Social History   Tobacco Use  . Smoking status: Never Smoker  . Smokeless tobacco: Never Used  Substance Use Topics  . Alcohol use: Yes    Comment: 7/week  . Drug use: Never     Colonoscopy: 1999?  PAP: 05/31/2020  Bone density: never done   Allergies  Allergen Reactions  . Latex   . Wound Dressing Adhesive     Tape etc     Current Outpatient Medications  Medication Sig Dispense Refill  . amoxicillin-clavulanate (AUGMENTIN) 500-125 MG tablet TAKE 1 TABLET BY MOUTH 3 TIMES DAILY FOR 7 DAYS. 21 tablet 0  . docusate sodium (COLACE) 100 MG capsule TAKE 2 CAPSULES BY MOUTH TWICE A DAY (Patient not taking: No sig reported) 80 capsule 0  . lidocaine-prilocaine (EMLA) cream APPLY TO AFFECTED AREA ONCE AS DIRECTED (Patient not  taking: Reported on 11/25/2020) 30 g 3  . metroNIDAZOLE (METROGEL) 1 % gel APPLY TO AFFECTED AREAS DAILY AS DIRECTED (Patient not taking: No sig reported) 60 g 0  . omeprazole (PRILOSEC) 40 MG capsule TAKE 1 CAPSULE (40 MG TOTAL) BY MOUTH AT BEDTIME. (Patient not taking: No sig reported) 90 capsule 4  . ondansetron (ZOFRAN-ODT) 4 MG disintegrating tablet DISSOLVE 1 TABLET (4 MG TOTAL) BY MOUTH EVERY 8 (EIGHT) HOURS AS NEEDED FOR NAUSEA OR VOMITING. (Patient not taking: No sig reported) 20 tablet 2  . prochlorperazine (COMPAZINE) 10 MG tablet TAKE 1 TABLET BY MOUTH EVERY 6 HOURS AS NEEDED FOR NAUSEA AND/OR VOMITING (Patient not taking: No sig reported) 30 tablet 1  . venlafaxine XR (EFFEXOR-XR) 37.5 MG 24 hr capsule Take 1 capsule (37.5 mg total) by mouth daily with breakfast. Take 1 tab daily x 2 weeks, then increase to 2 tab daily 60 capsule 2   No current facility-administered medications for this visit.    OBJECTIVE: White woman who appears well  Vitals:   12/30/20 1419  BP: 111/60  Pulse: 88  Resp: 18  Temp: (!) 97.4 F (36.3 C)  SpO2: 100%     Body mass index is 27.47 kg/m.   Wt Readings from Last 3 Encounters:  12/30/20 165  lb 1.6 oz (74.9 kg)  12/09/20 169 lb 3.2 oz (76.7 kg)  11/26/20 164 lb 14.4 oz (74.8 kg)     ECOG FS:1 - Symptomatic but completely ambulatory  Sclerae unicteric, EOMs intact Wearing a mask No cervical or supraclavicular adenopathy Lungs no rales or rhonchi Heart regular rate and rhythm Abd soft, nontender, positive bowel sounds MSK no focal spinal tenderness, no upper extremity lymphedema Neuro: nonfocal, well oriented, appropriate affect Breasts: Deferred   LAB RESULTS:  CMP     Component Value Date/Time   NA 139 12/30/2020 1343   K 4.0 12/30/2020 1343   CL 104 12/30/2020 1343   CO2 24 12/30/2020 1343   GLUCOSE 137 (H) 12/30/2020 1343   BUN 11 12/30/2020 1343   CREATININE 0.74 12/30/2020 1343   CREATININE 0.73 08/17/2020 0905   CALCIUM  9.1 12/30/2020 1343   PROT 7.3 12/30/2020 1343   ALBUMIN 4.4 12/30/2020 1343   AST 45 (H) 12/30/2020 1343   AST 18 08/17/2020 0905   ALT 67 (H) 12/30/2020 1343   ALT 18 08/17/2020 0905   ALKPHOS 63 12/30/2020 1343   BILITOT 0.5 12/30/2020 1343   BILITOT 0.7 08/17/2020 0905   GFRNONAA >60 12/30/2020 1343   GFRNONAA >60 08/17/2020 0905    No results found for: TOTALPROTELP, ALBUMINELP, A1GS, A2GS, BETS, BETA2SER, GAMS, MSPIKE, SPEI  Lab Results  Component Value Date   WBC 2.3 (L) 12/30/2020   NEUTROABS 1.3 (L) 12/30/2020   HGB 11.2 (L) 12/30/2020   HCT 31.3 (L) 12/30/2020   MCV 95.4 12/30/2020   PLT 339 12/30/2020    No results found for: LABCA2  No components found for: JEHUDJ497  No results for input(s): INR in the last 168 hours.  No results found for: LABCA2  No results found for: WYO378  No results found for: HYI502  No results found for: DXA128  No results found for: CA2729  No components found for: HGQUANT  No results found for: CEA1 / No results found for: CEA1   No results found for: AFPTUMOR  No results found for: CHROMOGRNA  No results found for: KPAFRELGTCHN, LAMBDASER, KAPLAMBRATIO (kappa/lambda light chains)  No results found for: HGBA, HGBA2QUANT, HGBFQUANT, HGBSQUAN (Hemoglobinopathy evaluation)   No results found for: LDH  No results found for: IRON, TIBC, IRONPCTSAT (Iron and TIBC)  No results found for: FERRITIN  Urinalysis    Component Value Date/Time   COLORURINE YELLOW 09/02/2020 0843   APPEARANCEUR HAZY (A) 09/02/2020 0843   LABSPEC 1.010 09/02/2020 0843   PHURINE 6.0 09/02/2020 0843   GLUCOSEU NEGATIVE 09/02/2020 0843   HGBUR SMALL (A) 09/02/2020 0843   BILIRUBINUR NEGATIVE 09/02/2020 0843   KETONESUR NEGATIVE 09/02/2020 0843   PROTEINUR NEGATIVE 09/02/2020 0843   NITRITE NEGATIVE 09/02/2020 0843   LEUKOCYTESUR TRACE (A) 09/02/2020 0843    STUDIES: No results found.   ELIGIBLE FOR AVAILABLE RESEARCH PROTOCOL:  AET  ASSESSMENT: 45 y.o. [place] woman status post right breast upper outer quadrant biopsy 06/08/2020 for a clinical mT2 N0, stage IB invasive ductal carcinoma, grade 2, estrogen and progesterone receptor positive, HER-2 not amplified, with an MIB-1 of 5%.  (1) genetics testing 06/22/2020 through the Common Hereditary Cancers Panel offered by [facility] found no deleterious mutations in APC, ATM, AXIN2, BARD1, BMPR1A, BRCA1, BRCA2, BRIP1, CDH1, CDK4, CDKN2A (p14ARF), CDKN2A (p16INK4a), CHEK2, CTNNA1, DICER1, EPCAM (Deletion/duplication testing only), GREM1 (promoter region deletion/duplication testing only), KIT, MEN1, MLH1, MSH2, MSH3, MSH6, MUTYH, NBN, NF1, NHTL1, PALB2, PDGFRA, PMS2, POLD1, POLE, PTEN,  RAD50, RAD51C, RAD51D, RNF43, SDHB, SDHC, SDHD, SMAD4, SMARCA4. STK11, TP53, TSC1, TSC2, and VHL.  The following genes were evaluated for sequence changes only: SDHA and HOXB13 c.251G>A variant only.  (a)  a variant of uncertain significance in MSH3 at c.2731T>G (p.Leu911Val).   (2) right lumpectomy and sentinel lymph node sampling 07/14/2020 showed a pT2 pN1(mic), stage IIA invasive ductal carcinoma, grade 2, with a positive superior margin  (a) additional surgery 08/03/2020  (3) MammaPrint obtained from the initial biopsy showed high risk, predicting a 93% 5-year disease-free survival with chemotherapy and significant benefit from chemotherapy (greater than 12%).  (4) cyclophosphamide and doxorubicin in dose dense fashion x4 started 10/15/2020 08/17/2020, completed 09/28/2020, followed by paclitaxel weekly x12 started 10/15/2020, completed 12/30/2020  (a) echo 07/09/2020 shows an ejection fraction in the 60-65% range.  (5) adjuvant radiation to follow  (6) antiestrogens to start at the completion of local treatment   PLAN: Autumn Bullock completes her paclitaxel treatments today.  She has done terrific with therapy and there have been no dose reductions or delays.  She has had no peripheral  neuropathy or other symptoms suggestive of endorgan damage  She continues to have rosacea despite the MetroGel.  I am adding doxycycline for her to take daily until the problem has resolved  She has not had a period for several months.  She understands her period is likely to resume.  They really need to continue to use contraception.  She tells me her husband is planning on a vasectomy sometime this year.  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  I will be glad to provide her or them with any information that would be helpful at this point  Otherwise she will see me again late June.  By that time she should be done with radiation and ready to start tamoxifen  Total encounter time 25 minutes.Sarajane Jews C. Rielly Corlett, MD 12/30/20 2:52 PM Medical Oncology and Hematology [facility] [place],  [address] Tel. [phone]    Fax. [phone]   I, Wilburn Mylar, am acting as scribe for Dr. Virgie Dad. Regis Hinton.  I, Lurline Del MD, have reviewed the above documentation for accuracy and completeness, and I agree with the above.   *Total Encounter Time as defined by the Centers for Medicare and Medicaid Services includes, in addition to the face-to-face time of a patient visit (documented in the note above) non-face-to-face time: obtaining and reviewing outside history, ordering and reviewing medications, tests or procedures, care coordination (communications with other health care professionals or caregivers) and documentation in the medical record.

## 2020-12-30 ENCOUNTER — Inpatient Hospital Stay: Payer: BC Managed Care – PPO

## 2020-12-30 ENCOUNTER — Other Ambulatory Visit: Payer: Self-pay

## 2020-12-30 ENCOUNTER — Inpatient Hospital Stay: Payer: BC Managed Care – PPO | Admitting: Oncology

## 2020-12-30 VITALS — BP 111/60 | HR 88 | Temp 97.4°F | Resp 18 | Ht 65.0 in | Wt 165.1 lb

## 2020-12-30 DIAGNOSIS — Z95828 Presence of other vascular implants and grafts: Secondary | ICD-10-CM

## 2020-12-30 DIAGNOSIS — C50411 Malignant neoplasm of upper-outer quadrant of right female breast: Secondary | ICD-10-CM

## 2020-12-30 DIAGNOSIS — Z170 Estrogen receptor positive status [ER+]: Secondary | ICD-10-CM

## 2020-12-30 DIAGNOSIS — Z5111 Encounter for antineoplastic chemotherapy: Secondary | ICD-10-CM | POA: Diagnosis not present

## 2020-12-30 LAB — COMPREHENSIVE METABOLIC PANEL
ALT: 67 U/L — ABNORMAL HIGH (ref 0–44)
AST: 45 U/L — ABNORMAL HIGH (ref 15–41)
Albumin: 4.4 g/dL (ref 3.5–5.0)
Alkaline Phosphatase: 63 U/L (ref 38–126)
Anion gap: 11 (ref 5–15)
BUN: 11 mg/dL (ref 6–20)
CO2: 24 mmol/L (ref 22–32)
Calcium: 9.1 mg/dL (ref 8.9–10.3)
Chloride: 104 mmol/L (ref 98–111)
Creatinine, Ser: 0.74 mg/dL (ref 0.44–1.00)
GFR, Estimated: >60 mL/min (ref >60)
Glucose, Bld: 137 mg/dL — ABNORMAL HIGH (ref 70–99)
Potassium: 4 mmol/L (ref 3.5–5.1)
Sodium: 139 mmol/L (ref 135–145)
Total Bilirubin: 0.5 mg/dL (ref 0.3–1.2)
Total Protein: 7.3 g/dL (ref 6.5–8.1)

## 2020-12-30 LAB — CBC WITH DIFFERENTIAL/PLATELET
Abs Immature Granulocytes: 0.01 10*3/uL (ref 0.00–0.07)
Basophils Absolute: 0 10*3/uL (ref 0.0–0.1)
Basophils Relative: 1 %
Eosinophils Absolute: 0.1 10*3/uL (ref 0.0–0.5)
Eosinophils Relative: 2 %
HCT: 31.3 % — ABNORMAL LOW (ref 36.0–46.0)
Hemoglobin: 11.2 g/dL — ABNORMAL LOW (ref 12.0–15.0)
Immature Granulocytes: 0 %
Lymphocytes Relative: 35 %
Lymphs Abs: 0.8 10*3/uL (ref 0.7–4.0)
MCH: 34.1 pg — ABNORMAL HIGH (ref 26.0–34.0)
MCHC: 35.8 g/dL (ref 30.0–36.0)
MCV: 95.4 fL (ref 80.0–100.0)
Monocytes Absolute: 0.2 10*3/uL (ref 0.1–1.0)
Monocytes Relative: 7 %
Neutro Abs: 1.3 10*3/uL — ABNORMAL LOW (ref 1.7–7.7)
Neutrophils Relative %: 55 %
Platelets: 339 10*3/uL (ref 150–400)
RBC: 3.28 MIL/uL — ABNORMAL LOW (ref 3.87–5.11)
RDW: 13.2 % (ref 11.5–15.5)
WBC: 2.3 10*3/uL — ABNORMAL LOW (ref 4.0–10.5)
nRBC: 0 % (ref 0.0–0.2)

## 2020-12-30 LAB — PREGNANCY, URINE: Preg Test, Ur: NEGATIVE

## 2020-12-30 MED ORDER — LORAZEPAM 2 MG/ML IJ SOLN: 0.5000 mg | Freq: Once | INTRAMUSCULAR | Status: DC

## 2020-12-30 MED ORDER — FAMOTIDINE IN NACL 20-0.9 MG/50ML-% IV SOLN
INTRAVENOUS | Status: AC
  Filled 2020-12-30: qty 50

## 2020-12-30 MED ORDER — HEPARIN SOD (PORK) LOCK FLUSH 100 UNIT/ML IV SOLN
500.0000 [IU] | Freq: Once | INTRAVENOUS | Status: DC | PRN
  Filled 2020-12-30: qty 5

## 2020-12-30 MED ORDER — SODIUM CHLORIDE 0.9 % IV SOLN
Freq: Once | INTRAVENOUS | Status: AC
Start: 2020-12-30 — End: 2020-12-30
  Filled 2020-12-30: qty 250

## 2020-12-30 MED ORDER — DEXAMETHASONE SODIUM PHOSPHATE 10 MG/ML IJ SOLN
4.0000 mg | Freq: Once | INTRAMUSCULAR | Status: AC
  Administered 2020-12-30: 4 mg via INTRAVENOUS

## 2020-12-30 MED ORDER — SODIUM CHLORIDE 0.9% FLUSH
10.0000 mL | Freq: Once | INTRAVENOUS | Status: AC
  Administered 2020-12-30: 10 mL
  Filled 2020-12-30: qty 10

## 2020-12-30 MED ORDER — SODIUM CHLORIDE 0.9% FLUSH
10.0000 mL | INTRAVENOUS | Status: DC | PRN
  Filled 2020-12-30: qty 10

## 2020-12-30 MED ORDER — FAMOTIDINE IN NACL 20-0.9 MG/50ML-% IV SOLN
20.0000 mg | Freq: Once | INTRAVENOUS | Status: AC
  Administered 2020-12-30: 20 mg via INTRAVENOUS

## 2020-12-30 MED ORDER — SODIUM CHLORIDE 0.9 % IV SOLN
80.0000 mg/m2 | Freq: Once | INTRAVENOUS | Status: AC
Start: 2020-12-30 — End: 2020-12-30
  Administered 2020-12-30: 144 mg via INTRAVENOUS
  Filled 2020-12-30: qty 24

## 2020-12-30 MED ORDER — DEXAMETHASONE SODIUM PHOSPHATE 10 MG/ML IJ SOLN
INTRAMUSCULAR | Status: AC
  Filled 2020-12-30: qty 1

## 2020-12-30 NOTE — Progress Notes (Signed)
Per Dr. Jana Hakim, okay to proceed with treatment today with [place] 1.3.

## 2020-12-30 NOTE — Progress Notes (Addendum)
Location of Breast Cancer: Malignant neoplasm of upper-outer quadrant of RIGHT breast, estrogen receptor positive   Histology per Pathology Report:  08/03/2020 FINAL MICROSCOPIC DIAGNOSIS:  A. BREAST, RIGHT SUPERIOR LUMPECTOMY SITE, EXCISION:  - Fibrocystic changes with calcifications.  - Previous lumpectomy site changes.  - No residual carcinoma identified.  - Final superior margin free of tumor  07/14/2020 FINAL MICROSCOPIC DIAGNOSIS:  A. BREAST, RIGHT W/SEED X2, LUMPECTOMY:  - Invasive ductal carcinoma, Nottingham grade 2 of 3  - Ductal carcinoma in-situ, intermediate grade  - Calcifications associated with carcinoma  - Carcinoma involves the inked superior margin  - Previous biopsy site changes present (x2)  - See oncology table and comment below  B. SENTINEL LYMPH NODE, RIGHT AXILLARY, BIOPSY:  -Micrometastasis involving one lymph node (47m/1)   Receptor Status: ER(90%), PR (70%), Her2-neu (Negative), Ki-67(5%)  Did patient present with symptoms (if so, please note symptoms) or was this found on screening mammography?:  Patient had routine screening mammography on 06/02/2020 showing a possible abnormality in the right breast. She underwent right diagnostic mammography with tomography and right breast ultrasonography at [facility] 06/08/2020 showing: breast density category C; 2.3 cm right breast mass at 11:30; 0.8 cm mass superficial to dominant mass; no right axillary adenopathy.  Past/Anticipated interventions by surgeon, if any:  08/03/2020 Dr. MDonnie MesaRight radioactive seed localized lumpectomy  07/14/2020 Dr. MDonnie MesaUltrasound guided right internal jugular vein port placement/ right bracketed radioactive seed localized lumpectomy/ right sentinel lymph node biopsy/ blue dye injection  Past/Anticipated interventions by medical oncology, if any:  Under care of Dr. GSarajane JewsMagrinat 12/31/2020 (1) genetics testing 06/22/2020 through the Common  Hereditary Cancers Panel offered by [facility] found no deleterious mutations in APC, ATM, AXIN2, BARD1, BMPR1A, BRCA1, BRCA2, BRIP1, CDH1, CDK4, CDKN2A (p14ARF), CDKN2A (p16INK4a), CHEK2, CTNNA1, DICER1, EPCAM (Deletion/duplication testing only), GREM1 (promoter region deletion/duplication testing only), KIT, MEN1, MLH1, MSH2, MSH3, MSH6, MUTYH, NBN, NF1, NHTL1, PALB2, PDGFRA, PMS2, POLD1, POLE, PTEN, RAD50, RAD51C, RAD51D, RNF43, SDHB, SDHC, SDHD, SMAD4, SMARCA4. STK11, TP53, TSC1, TSC2, and VHL. The following genes were evaluated for sequence changes only: SDHA and HOXB13 c.251G>A variant only.             (a)  a variant of uncertain significance in MSH3at c.2731T>G (p.Leu911Val).  (2) right lumpectomy and sentinel lymph node sampling 07/14/2020 showed a pT2 pN1(mic), stage IIA invasive ductal carcinoma, grade 2, with a positive superior margin             (a) additional surgery 08/03/2020 (3) MammaPrint obtained from the initial biopsy showed high risk, predicting a 93% 5-year disease-free survival with chemotherapy and significant benefit from chemotherapy (greater than 12%). (4) cyclophosphamide and doxorubicin in dose dense fashion x4 started 10/15/2020 08/17/2020, completed 09/28/2020, followed by paclitaxel weekly x12 started 10/15/2020, completed 12/30/2020             (a) echo 07/09/2020 shows an ejection fraction in the 60-65% range. (5) adjuvant radiation to follow (6) antiestrogens to start at the completion of local treatment --Raynie completes her paclitaxel treatments today.  She has done terrific with therapy and there have been no dose reductions or delays.   --She has had no peripheral neuropathy or other symptoms suggestive of endorgan damage --She continues to have rosacea despite the MetroGel.  I am adding doxycycline for her to take daily until the problem has resolved --She has not had a period for several months.  She understands her period is likely to resume.  They really need  to continue to use contraception.  She tells me her husband is planning on a vasectomy sometime this year.  Until then they are using barrier methods --Otherwise she will see me again late June.  By that time she should be done with radiation and ready to start tamoxifen  Lymphedema issues, if any:  Patient denies. Reports she was assessed by PT twice and deemed low risk since she only had 1 lymph node removed  Pain issues, if any:  Patient denies--reports residual numbness since surgery    SAFETY ISSUES:  Prior radiation? No   Pacemaker/ICD? No  Possible current pregnancy? No--LMP ~3-4 months ago  Is the patient on methotrexate? No  Current Complaints / other details: Received first [place] vaccines. She reports she feels poorly/nauseous since her last cycle of paclitaxel yesterday, but otherwise she feels she is doing well and anxious to get started with radiation

## 2020-12-31 ENCOUNTER — Encounter: Payer: Self-pay | Admitting: Oncology

## 2020-12-31 ENCOUNTER — Other Ambulatory Visit (HOSPITAL_COMMUNITY): Payer: Self-pay

## 2020-12-31 ENCOUNTER — Encounter: Payer: Self-pay | Admitting: Radiation Oncology

## 2020-12-31 ENCOUNTER — Ambulatory Visit
Admission: RE | Admit: 2020-12-31 | Discharge: 2020-12-31 | Disposition: A | Discharge status: Home / Self Care | Payer: BC Managed Care – PPO | Source: Ambulatory Visit | Attending: Radiation Oncology | Admitting: Radiation Oncology

## 2020-12-31 ENCOUNTER — Ambulatory Visit
Admission: RE | Admit: 2020-12-31 | Payer: BC Managed Care – PPO | Source: Ambulatory Visit | Admitting: Radiation Oncology

## 2020-12-31 ENCOUNTER — Other Ambulatory Visit: Payer: Self-pay

## 2020-12-31 VITALS — BP 108/76 | HR 74 | Temp 96.7°F | Resp 18 | Ht 65.0 in | Wt 164.2 lb

## 2020-12-31 DIAGNOSIS — Z170 Estrogen receptor positive status [ER+]: Secondary | ICD-10-CM | POA: Insufficient documentation

## 2020-12-31 DIAGNOSIS — C50411 Malignant neoplasm of upper-outer quadrant of right female breast: Secondary | ICD-10-CM

## 2020-12-31 DIAGNOSIS — R110 Nausea: Secondary | ICD-10-CM | POA: Insufficient documentation

## 2020-12-31 DIAGNOSIS — Z79899 Other long term (current) drug therapy: Secondary | ICD-10-CM | POA: Diagnosis not present

## 2020-12-31 MED ORDER — DOXYCYCLINE HYCLATE 100 MG PO TABS
100.0000 mg | ORAL_TABLET | Freq: Two times a day (BID) | ORAL | 1 refills | Status: DC
  Filled 2020-12-31: qty 60, 30d supply, fill #0

## 2020-12-31 NOTE — Progress Notes (Signed)
Radiation Oncology         [phone]) [phone] ________________________________  Name: Autumn Bullock MRN: [id number]  Date: 12/31/2020  DOB: 09-21-1975  Follow-Up Visit Note  Outpatient  CC: Brock Ra, PA-C  Magrinat, Virgie Dad, MD  Diagnosis:      ICD-10-CM   1. Malignant neoplasm of upper-outer quadrant of right breast in female, estrogen receptor positive ([place])  C50.411    Z17.0     Cancer Staging Malignant neoplasm of upper-outer quadrant of right breast in female, estrogen receptor positive ([place]) Staging form: Breast, AJCC 8th Edition - Clinical stage from 06/16/2020: Stage IB (cT2, cN0, cM0, G2, ER+, PR+, HER2-) - Unsigned Stage prefix: Initial diagnosis Histologic grading system: 3 grade system Laterality: Right Staged by: Pathologist and managing physician Stage used in treatment planning: Yes National guidelines used in treatment planning: Yes Type of national guideline used in treatment planning: NCCN  pT2, pN41m   CHIEF COMPLAINT: Here to discuss management of right breast cancer  Narrative: The patient returns today for follow-up.  She was seen in consultation on 06/16/2020.   Since consultation date, she underwent the following imaging (dates and results as follows): MRI of bilateral breasts on 06/22/2020 that showed the known malignancy in the upper outer quadrant of the right breast. Two sites were measured together and were 2.8 cm. There was no axillary adenopathy.    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  Right superior lumpectomy site excision on 08/03/2020 revealed  fibrocystic changes with calcifications, but no residual carcinoma. Final superior margin was free of tumor.  Systemic therapy, if applicable, involved (dates and therapy as follows): treatment with Cyclophosphamide and Doxorubicin in dose dense fashion x4 from 08/17/2020 to 09/28/2020 followed by Paclitaxel weekly x12 from 10/15/2020 to 12/30/2020.   Lymphedema issues, if any:  Patient denies. Reports she was assessed by PT twice and deemed low risk since she only had 1 lymph node removed  Pain issues, if any:  Patient denies--reports residual numbness since surgery    SAFETY ISSUES:  Prior radiation? No   Pacemaker/ICD? No  Possible current pregnancy? No--LMP ~3-4 months ago; negative pregnancy test this week  Is the patient on methotrexate? No  Current Complaints / other details: Received first 3Hardinvaccines. She reports she feels poorly/nauseous since her last cycle of paclitaxel yesterday, but otherwise she feels she is doing well and anxious to get started with radiation          ALLERGIES:  is allergic to latex and wound dressing adhesive.  Meds: Current Outpatient Medications  Medication Sig Dispense Refill  . venlafaxine XR (EFFEXOR-XR) 37.5 MG 24 hr capsule Take 1 capsule (37.5 mg total) by mouth daily with breakfast. Take 1 tab daily x 2 weeks, then increase to 2 tab daily 60 capsule 2  . amoxicillin-clavulanate (AUGMENTIN) 500-125 MG tablet TAKE 1 TABLET BY MOUTH 3 TIMES DAILY FOR 7 DAYS. 21 tablet 0  . docusate sodium (COLACE) 100 MG capsule TAKE 2 CAPSULES BY MOUTH TWICE A DAY (Patient not taking: No sig reported) 80 capsule 0  . lidocaine-prilocaine (EMLA) cream APPLY TO AFFECTED AREA ONCE AS  DIRECTED (Patient not taking: Reported on 11/25/2020) 30 g 3  . metroNIDAZOLE (METROGEL) 1 % gel APPLY TO AFFECTED AREAS DAILY AS DIRECTED (Patient not taking: No sig reported) 60 g 0  . omeprazole (PRILOSEC) 40 MG capsule TAKE 1 CAPSULE (40 MG TOTAL) BY MOUTH AT BEDTIME.  (Patient not taking: No sig reported) 90 capsule 4  . ondansetron (ZOFRAN-ODT) 4 MG disintegrating tablet DISSOLVE 1 TABLET (4 MG TOTAL) BY MOUTH EVERY 8 (EIGHT) HOURS AS NEEDED FOR NAUSEA OR VOMITING. (Patient not taking: No sig reported) 20 tablet 2  . phentermine 37.5 MG capsule Take 1 capsule by mouth daily.    . prochlorperazine (COMPAZINE) 10 MG tablet TAKE 1 TABLET BY MOUTH EVERY 6 HOURS AS NEEDED FOR NAUSEA AND/OR VOMITING (Patient not taking: No sig reported) 30 tablet 1   No current facility-administered medications for this encounter.    Physical Findings:  height is $RemoveB'5\' 5"'bsqlmleZ$  (1.651 m) and weight is 164 lb 4 oz (74.5 kg). Her temporal temperature is 96.7 F (35.9 C) (abnormal). Her blood pressure is 108/76 and her pulse is 74. Her respiration is 18 and oxygen saturation is 97%. .     General: Alert and oriented, in no acute distress HEENT: Alopecia, head is normocephalic  Neurologic: No obvious focalities. Speech is fluent.  Psychiatric: Judgment and insight are intact. Affect is appropriate. Breast exam reveals satisfactory healing of right breast surgical scars  Lab Findings: Lab Results  Component Value Date   WBC 2.3 (L) 12/30/2020   HGB 11.2 (L) 12/30/2020   HCT 31.3 (L) 12/30/2020   MCV 95.4 12/30/2020   PLT 339 12/30/2020      Radiographic Findings: As above  Impression/Plan: Right breast cancer  We discussed adjuvant radiotherapy today.  We talked about the pros and cons of different treatment regimens today - 1 option would be for her to receive 4 weeks of hypofractionated  radiation to the right breast and axilla with high tangent fields.  Given the micrometastasis to the 1/1 nodes excised, it is also reasonable for her to receive 4 field radiation therapy over 6 weeks to treat the breast and axilla comprehensively as well as the supraclavicular nodes.  She understands the likelihood that she would have disease in the upper axillary nodes and supraclavicular nodes  is low, but given her young age, it is reasonable for [place] to be aggressive and treat these nodes prophylactically.  She would like to be aggressive and therefore she chooses the 6-week regimen.     I reviewed the logistics, benefits, risks, and potential side effects of this treatment in detail. Risks may include but not necessary be limited to acute and late injury tissue in the radiation fields such as skin irritation (change in color/pigmentation, itching, dryness, pain, peeling). She may experience fatigue. We also discussed possible risk of long term cosmetic changes or scar tissue. There is also a smaller risk for lung toxicity, brachial plexopathy, lymphedema, musculoskeletal changes, rib fragility or induction of a second malignancy, late chronic non-healing soft tissue wound.    The patient asked good questions which I answered to her satisfaction. She is enthusiastic about proceeding with treatment. A consent form has been signed and placed in her chart.  She understands importance of using condoms and avoiding pregnancy during radiation therapy even if she is not menstruating.  She understands that radiation therapy is dangerous to fetuses.  She will be leaving on a beach trip in early July.  We will give her some  time to heal from chemotherapy yet still start her radiation therapy in time so that she can enjoy her vacation.  On date of service, in total, I spent 30 minutes on this encounter. Patient was seen in person.  _____________________________________   Eppie Gibson, MD  This document serves as a record of services personally performed by Eppie Gibson, MD. It was created on his behalf by Clerance Lav, a trained medical scribe. The creation of this record is based on the scribe's personal observations and the provider's statements to them. This document has been checked and approved by the attending provider.

## 2021-01-03 ENCOUNTER — Telehealth: Payer: Self-pay | Admitting: *Deleted

## 2021-01-03 ENCOUNTER — Encounter: Payer: Self-pay | Admitting: Oncology

## 2021-01-03 ENCOUNTER — Encounter: Payer: Self-pay | Admitting: Radiation Oncology

## 2021-01-03 ENCOUNTER — Other Ambulatory Visit: Payer: Self-pay

## 2021-01-03 DIAGNOSIS — Z170 Estrogen receptor positive status [ER+]: Secondary | ICD-10-CM

## 2021-01-03 DIAGNOSIS — C50411 Malignant neoplasm of upper-outer quadrant of right female breast: Secondary | ICD-10-CM

## 2021-01-03 NOTE — Telephone Encounter (Signed)
CALLED PATIENT TO INFORM OF STAT LABS ON 01-17-21 @ 1:30 PM, SPOKE WITH PATIENT AND SHE IS AWARE OF THIS APPT.

## 2021-01-04 ENCOUNTER — Encounter: Payer: Self-pay | Admitting: *Deleted

## 2021-01-04 ENCOUNTER — Telehealth: Payer: Self-pay | Admitting: Oncology

## 2021-01-04 NOTE — Telephone Encounter (Signed)
Per 4/27 los, Left message

## 2021-01-07 DIAGNOSIS — C50411 Malignant neoplasm of upper-outer quadrant of right female breast: Secondary | ICD-10-CM | POA: Insufficient documentation

## 2021-01-07 DIAGNOSIS — Z170 Estrogen receptor positive status [ER+]: Secondary | ICD-10-CM | POA: Diagnosis present

## 2021-01-17 ENCOUNTER — Other Ambulatory Visit: Payer: Self-pay

## 2021-01-17 ENCOUNTER — Ambulatory Visit: Payer: BC Managed Care – PPO

## 2021-01-17 ENCOUNTER — Ambulatory Visit
Admission: RE | Admit: 2021-01-17 | Discharge: 2021-01-17 | Disposition: A | Discharge status: Home / Self Care | Payer: BC Managed Care – PPO | Source: Ambulatory Visit | Attending: Radiation Oncology | Admitting: Radiation Oncology

## 2021-01-17 DIAGNOSIS — C50411 Malignant neoplasm of upper-outer quadrant of right female breast: Secondary | ICD-10-CM | POA: Diagnosis not present

## 2021-01-17 DIAGNOSIS — Z170 Estrogen receptor positive status [ER+]: Secondary | ICD-10-CM

## 2021-01-17 LAB — COMPREHENSIVE METABOLIC PANEL
ALT: 24 U/L (ref 0–44)
AST: 23 U/L (ref 15–41)
Albumin: 4.2 g/dL (ref 3.5–5.0)
Alkaline Phosphatase: 58 U/L (ref 38–126)
Anion gap: 7 (ref 5–15)
BUN: 19 mg/dL (ref 6–20)
CO2: 27 mmol/L (ref 22–32)
Calcium: 9.1 mg/dL (ref 8.9–10.3)
Chloride: 107 mmol/L (ref 98–111)
Creatinine, Ser: 0.81 mg/dL (ref 0.44–1.00)
GFR, Estimated: >60 mL/min (ref >60)
Glucose, Bld: 97 mg/dL (ref 70–99)
Potassium: 3.7 mmol/L (ref 3.5–5.1)
Sodium: 141 mmol/L (ref 135–145)
Total Bilirubin: 0.4 mg/dL (ref 0.3–1.2)
Total Protein: 6.9 g/dL (ref 6.5–8.1)

## 2021-01-17 LAB — CBC WITH DIFFERENTIAL/PLATELET
Abs Immature Granulocytes: 0.02 10*3/uL (ref 0.00–0.07)
Basophils Absolute: 0 10*3/uL (ref 0.0–0.1)
Basophils Relative: 1 %
Eosinophils Absolute: 0.1 10*3/uL (ref 0.0–0.5)
Eosinophils Relative: 2 %
HCT: 32.7 % — ABNORMAL LOW (ref 36.0–46.0)
Hemoglobin: 11.4 g/dL — ABNORMAL LOW (ref 12.0–15.0)
Immature Granulocytes: 1 %
Lymphocytes Relative: 24 %
Lymphs Abs: 1 10*3/uL (ref 0.7–4.0)
MCH: 33.7 pg (ref 26.0–34.0)
MCHC: 34.9 g/dL (ref 30.0–36.0)
MCV: 96.7 fL (ref 80.0–100.0)
Monocytes Absolute: 0.5 10*3/uL (ref 0.1–1.0)
Monocytes Relative: 13 %
Neutro Abs: 2.4 10*3/uL (ref 1.7–7.7)
Neutrophils Relative %: 59 %
Platelets: 283 10*3/uL (ref 150–400)
RBC: 3.38 MIL/uL — ABNORMAL LOW (ref 3.87–5.11)
RDW: 12.8 % (ref 11.5–15.5)
WBC: 4.1 10*3/uL (ref 4.0–10.5)
nRBC: 0 % (ref 0.0–0.2)

## 2021-01-17 LAB — PREGNANCY, URINE: Preg Test, Ur: NEGATIVE

## 2021-01-17 MED ORDER — RADIAPLEXRX EX GEL: Freq: Once | CUTANEOUS | Status: AC

## 2021-01-17 MED ORDER — ALRA NON-METALLIC DEODORANT (RAD-ONC)
1.0000 "application " | Freq: Once | TOPICAL | Status: AC
  Administered 2021-01-17: 1 via TOPICAL

## 2021-01-17 NOTE — Progress Notes (Signed)

## 2021-01-18 ENCOUNTER — Ambulatory Visit
Admission: RE | Admit: 2021-01-18 | Discharge: 2021-01-18 | Disposition: A | Discharge status: Home / Self Care | Payer: BC Managed Care – PPO | Source: Ambulatory Visit | Attending: Radiation Oncology | Admitting: Radiation Oncology

## 2021-01-18 DIAGNOSIS — C50411 Malignant neoplasm of upper-outer quadrant of right female breast: Secondary | ICD-10-CM | POA: Diagnosis not present

## 2021-01-19 ENCOUNTER — Ambulatory Visit
Admission: RE | Admit: 2021-01-19 | Discharge: 2021-01-19 | Disposition: A | Discharge status: Home / Self Care | Payer: BC Managed Care – PPO | Source: Ambulatory Visit | Attending: Radiation Oncology | Admitting: Radiation Oncology

## 2021-01-19 ENCOUNTER — Other Ambulatory Visit: Payer: Self-pay

## 2021-01-19 DIAGNOSIS — C50411 Malignant neoplasm of upper-outer quadrant of right female breast: Secondary | ICD-10-CM | POA: Diagnosis not present

## 2021-01-20 ENCOUNTER — Other Ambulatory Visit: Payer: Self-pay

## 2021-01-20 ENCOUNTER — Ambulatory Visit
Admission: RE | Admit: 2021-01-20 | Discharge: 2021-01-20 | Disposition: A | Discharge status: Home / Self Care | Payer: BC Managed Care – PPO | Source: Ambulatory Visit | Attending: Radiation Oncology | Admitting: Radiation Oncology

## 2021-01-20 DIAGNOSIS — C50411 Malignant neoplasm of upper-outer quadrant of right female breast: Secondary | ICD-10-CM | POA: Diagnosis not present

## 2021-01-21 ENCOUNTER — Ambulatory Visit
Admission: RE | Admit: 2021-01-21 | Discharge: 2021-01-21 | Disposition: A | Discharge status: Home / Self Care | Payer: BC Managed Care – PPO | Source: Ambulatory Visit | Attending: Radiation Oncology | Admitting: Radiation Oncology

## 2021-01-21 DIAGNOSIS — C50411 Malignant neoplasm of upper-outer quadrant of right female breast: Secondary | ICD-10-CM | POA: Diagnosis not present

## 2021-01-24 ENCOUNTER — Ambulatory Visit
Admission: RE | Admit: 2021-01-24 | Discharge: 2021-01-24 | Disposition: A | Discharge status: Home / Self Care | Payer: BC Managed Care – PPO | Source: Ambulatory Visit | Attending: Radiation Oncology | Admitting: Radiation Oncology

## 2021-01-24 ENCOUNTER — Ambulatory Visit: Payer: BC Managed Care – PPO

## 2021-01-24 DIAGNOSIS — C50411 Malignant neoplasm of upper-outer quadrant of right female breast: Secondary | ICD-10-CM | POA: Diagnosis not present

## 2021-01-25 ENCOUNTER — Ambulatory Visit
Admission: RE | Admit: 2021-01-25 | Discharge: 2021-01-25 | Disposition: A | Discharge status: Home / Self Care | Payer: BC Managed Care – PPO | Source: Ambulatory Visit | Attending: Radiation Oncology | Admitting: Radiation Oncology

## 2021-01-25 ENCOUNTER — Other Ambulatory Visit: Payer: Self-pay

## 2021-01-25 DIAGNOSIS — C50411 Malignant neoplasm of upper-outer quadrant of right female breast: Secondary | ICD-10-CM | POA: Diagnosis not present

## 2021-01-26 ENCOUNTER — Other Ambulatory Visit: Payer: Self-pay | Admitting: Adult Health

## 2021-01-26 ENCOUNTER — Ambulatory Visit
Admission: RE | Admit: 2021-01-26 | Discharge: 2021-01-26 | Disposition: A | Discharge status: Home / Self Care | Payer: BC Managed Care – PPO | Source: Ambulatory Visit | Attending: Radiation Oncology | Admitting: Radiation Oncology

## 2021-01-26 DIAGNOSIS — Z170 Estrogen receptor positive status [ER+]: Secondary | ICD-10-CM

## 2021-01-26 DIAGNOSIS — C50411 Malignant neoplasm of upper-outer quadrant of right female breast: Secondary | ICD-10-CM

## 2021-01-27 ENCOUNTER — Other Ambulatory Visit: Payer: Self-pay

## 2021-01-27 ENCOUNTER — Ambulatory Visit
Admission: RE | Admit: 2021-01-27 | Discharge: 2021-01-27 | Disposition: A | Discharge status: Home / Self Care | Payer: BC Managed Care – PPO | Source: Ambulatory Visit | Attending: Radiation Oncology | Admitting: Radiation Oncology

## 2021-01-27 ENCOUNTER — Other Ambulatory Visit: Payer: Self-pay | Admitting: Adult Health

## 2021-01-27 ENCOUNTER — Encounter: Payer: Self-pay | Admitting: Oncology

## 2021-01-27 DIAGNOSIS — C50411 Malignant neoplasm of upper-outer quadrant of right female breast: Secondary | ICD-10-CM | POA: Diagnosis not present

## 2021-01-27 MED ORDER — VENLAFAXINE HCL ER 37.5 MG PO CP24: 37.5000 mg | ORAL_CAPSULE | Freq: Every day | ORAL | 1 refills | Status: DC

## 2021-01-28 ENCOUNTER — Ambulatory Visit
Admission: RE | Admit: 2021-01-28 | Discharge: 2021-01-28 | Disposition: A | Discharge status: Home / Self Care | Payer: BC Managed Care – PPO | Source: Ambulatory Visit | Attending: Radiation Oncology | Admitting: Radiation Oncology

## 2021-01-28 ENCOUNTER — Other Ambulatory Visit: Payer: Self-pay

## 2021-01-28 DIAGNOSIS — C50411 Malignant neoplasm of upper-outer quadrant of right female breast: Secondary | ICD-10-CM | POA: Diagnosis not present

## 2021-02-01 ENCOUNTER — Ambulatory Visit
Admission: RE | Admit: 2021-02-01 | Discharge: 2021-02-01 | Disposition: A | Discharge status: Home / Self Care | Payer: BC Managed Care – PPO | Source: Ambulatory Visit | Attending: Radiation Oncology | Admitting: Radiation Oncology

## 2021-02-01 DIAGNOSIS — C50411 Malignant neoplasm of upper-outer quadrant of right female breast: Secondary | ICD-10-CM | POA: Diagnosis not present

## 2021-02-02 ENCOUNTER — Ambulatory Visit
Admission: RE | Admit: 2021-02-02 | Discharge: 2021-02-02 | Disposition: A | Discharge status: Home / Self Care | Payer: BC Managed Care – PPO | Source: Ambulatory Visit | Attending: Radiation Oncology | Admitting: Radiation Oncology

## 2021-02-02 ENCOUNTER — Other Ambulatory Visit: Payer: Self-pay

## 2021-02-02 DIAGNOSIS — C50411 Malignant neoplasm of upper-outer quadrant of right female breast: Secondary | ICD-10-CM

## 2021-02-02 DIAGNOSIS — Z170 Estrogen receptor positive status [ER+]: Secondary | ICD-10-CM | POA: Insufficient documentation

## 2021-02-02 MED ORDER — RADIAPLEXRX EX GEL: Freq: Once | CUTANEOUS | Status: AC

## 2021-02-03 ENCOUNTER — Ambulatory Visit
Admission: RE | Admit: 2021-02-03 | Discharge: 2021-02-03 | Disposition: A | Discharge status: Home / Self Care | Payer: BC Managed Care – PPO | Source: Ambulatory Visit | Attending: Radiation Oncology | Admitting: Radiation Oncology

## 2021-02-03 DIAGNOSIS — C50411 Malignant neoplasm of upper-outer quadrant of right female breast: Secondary | ICD-10-CM | POA: Diagnosis not present

## 2021-02-04 ENCOUNTER — Ambulatory Visit
Admission: RE | Admit: 2021-02-04 | Discharge: 2021-02-04 | Disposition: A | Discharge status: Home / Self Care | Payer: BC Managed Care – PPO | Source: Ambulatory Visit | Attending: Radiation Oncology | Admitting: Radiation Oncology

## 2021-02-04 DIAGNOSIS — C50411 Malignant neoplasm of upper-outer quadrant of right female breast: Secondary | ICD-10-CM | POA: Diagnosis not present

## 2021-02-07 ENCOUNTER — Ambulatory Visit
Admission: RE | Admit: 2021-02-07 | Discharge: 2021-02-07 | Disposition: A | Discharge status: Home / Self Care | Payer: BC Managed Care – PPO | Source: Ambulatory Visit | Attending: Radiation Oncology | Admitting: Radiation Oncology

## 2021-02-07 ENCOUNTER — Other Ambulatory Visit: Payer: Self-pay

## 2021-02-07 DIAGNOSIS — C50411 Malignant neoplasm of upper-outer quadrant of right female breast: Secondary | ICD-10-CM | POA: Diagnosis not present

## 2021-02-08 ENCOUNTER — Ambulatory Visit
Admission: RE | Admit: 2021-02-08 | Discharge: 2021-02-08 | Disposition: A | Discharge status: Home / Self Care | Payer: BC Managed Care – PPO | Source: Ambulatory Visit | Attending: Radiation Oncology | Admitting: Radiation Oncology

## 2021-02-08 DIAGNOSIS — C50411 Malignant neoplasm of upper-outer quadrant of right female breast: Secondary | ICD-10-CM | POA: Diagnosis not present

## 2021-02-09 ENCOUNTER — Other Ambulatory Visit: Payer: Self-pay

## 2021-02-09 ENCOUNTER — Ambulatory Visit
Admission: RE | Admit: 2021-02-09 | Discharge: 2021-02-09 | Disposition: A | Discharge status: Home / Self Care | Payer: BC Managed Care – PPO | Source: Ambulatory Visit | Attending: Radiation Oncology | Admitting: Radiation Oncology

## 2021-02-09 DIAGNOSIS — C50411 Malignant neoplasm of upper-outer quadrant of right female breast: Secondary | ICD-10-CM | POA: Diagnosis not present

## 2021-02-10 ENCOUNTER — Ambulatory Visit
Admission: RE | Admit: 2021-02-10 | Discharge: 2021-02-10 | Disposition: A | Discharge status: Home / Self Care | Payer: BC Managed Care – PPO | Source: Ambulatory Visit | Attending: Radiation Oncology | Admitting: Radiation Oncology

## 2021-02-10 DIAGNOSIS — C50411 Malignant neoplasm of upper-outer quadrant of right female breast: Secondary | ICD-10-CM | POA: Diagnosis not present

## 2021-02-11 ENCOUNTER — Ambulatory Visit
Admission: RE | Admit: 2021-02-11 | Discharge: 2021-02-11 | Disposition: A | Discharge status: Home / Self Care | Payer: BC Managed Care – PPO | Source: Ambulatory Visit | Attending: Radiation Oncology | Admitting: Radiation Oncology

## 2021-02-11 ENCOUNTER — Other Ambulatory Visit: Payer: Self-pay

## 2021-02-11 DIAGNOSIS — C50411 Malignant neoplasm of upper-outer quadrant of right female breast: Secondary | ICD-10-CM | POA: Diagnosis not present

## 2021-02-14 ENCOUNTER — Ambulatory Visit
Admission: RE | Admit: 2021-02-14 | Discharge: 2021-02-14 | Disposition: A | Discharge status: Home / Self Care | Payer: BC Managed Care – PPO | Source: Ambulatory Visit | Attending: Radiation Oncology | Admitting: Radiation Oncology

## 2021-02-14 ENCOUNTER — Ambulatory Visit: Payer: BC Managed Care – PPO | Admitting: Radiation Oncology

## 2021-02-14 ENCOUNTER — Other Ambulatory Visit: Payer: Self-pay | Admitting: Radiation Oncology

## 2021-02-14 ENCOUNTER — Other Ambulatory Visit (HOSPITAL_COMMUNITY): Payer: Self-pay

## 2021-02-14 DIAGNOSIS — C50411 Malignant neoplasm of upper-outer quadrant of right female breast: Secondary | ICD-10-CM | POA: Diagnosis not present

## 2021-02-14 DIAGNOSIS — Z170 Estrogen receptor positive status [ER+]: Secondary | ICD-10-CM

## 2021-02-14 MED ORDER — LIDOCAINE HCL URETHRAL/MUCOSAL 2 % EX GEL
1.0000 "application " | Freq: Four times a day (QID) | CUTANEOUS | 3 refills | Status: DC | PRN
  Filled 2021-02-14: qty 30, 8d supply, fill #0

## 2021-02-14 MED ORDER — SONAFINE EX EMUL
1.0000 "application " | Freq: Two times a day (BID) | CUTANEOUS | Status: DC
  Administered 2021-02-14: 1 via TOPICAL

## 2021-02-15 ENCOUNTER — Other Ambulatory Visit: Payer: Self-pay

## 2021-02-15 ENCOUNTER — Ambulatory Visit
Admission: RE | Admit: 2021-02-15 | Discharge: 2021-02-15 | Disposition: A | Discharge status: Home / Self Care | Payer: BC Managed Care – PPO | Source: Ambulatory Visit | Attending: Radiation Oncology | Admitting: Radiation Oncology

## 2021-02-15 ENCOUNTER — Other Ambulatory Visit (HOSPITAL_COMMUNITY): Payer: Self-pay

## 2021-02-15 DIAGNOSIS — C50411 Malignant neoplasm of upper-outer quadrant of right female breast: Secondary | ICD-10-CM | POA: Diagnosis not present

## 2021-02-16 ENCOUNTER — Ambulatory Visit
Admission: RE | Admit: 2021-02-16 | Discharge: 2021-02-16 | Disposition: A | Discharge status: Home / Self Care | Payer: BC Managed Care – PPO | Source: Ambulatory Visit | Attending: Radiation Oncology | Admitting: Radiation Oncology

## 2021-02-16 DIAGNOSIS — C50411 Malignant neoplasm of upper-outer quadrant of right female breast: Secondary | ICD-10-CM | POA: Diagnosis not present

## 2021-02-17 ENCOUNTER — Ambulatory Visit
Admission: RE | Admit: 2021-02-17 | Discharge: 2021-02-17 | Disposition: A | Discharge status: Home / Self Care | Payer: BC Managed Care – PPO | Source: Ambulatory Visit | Attending: Radiation Oncology | Admitting: Radiation Oncology

## 2021-02-17 DIAGNOSIS — C50411 Malignant neoplasm of upper-outer quadrant of right female breast: Secondary | ICD-10-CM | POA: Diagnosis not present

## 2021-02-18 ENCOUNTER — Other Ambulatory Visit: Payer: Self-pay

## 2021-02-18 ENCOUNTER — Ambulatory Visit
Admission: RE | Admit: 2021-02-18 | Discharge: 2021-02-18 | Disposition: A | Discharge status: Home / Self Care | Payer: BC Managed Care – PPO | Source: Ambulatory Visit | Attending: Radiation Oncology | Admitting: Radiation Oncology

## 2021-02-18 DIAGNOSIS — C50411 Malignant neoplasm of upper-outer quadrant of right female breast: Secondary | ICD-10-CM

## 2021-02-18 DIAGNOSIS — Z170 Estrogen receptor positive status [ER+]: Secondary | ICD-10-CM

## 2021-02-18 MED ORDER — SONAFINE EX EMUL
1.0000 | Freq: Two times a day (BID) | CUTANEOUS | Status: DC
Start: 2021-02-18 — End: 2021-02-19

## 2021-02-21 ENCOUNTER — Other Ambulatory Visit: Payer: Self-pay

## 2021-02-21 ENCOUNTER — Ambulatory Visit
Admission: RE | Admit: 2021-02-21 | Discharge: 2021-02-21 | Disposition: A | Discharge status: Home / Self Care | Payer: BC Managed Care – PPO | Source: Ambulatory Visit | Attending: Radiation Oncology | Admitting: Radiation Oncology

## 2021-02-21 ENCOUNTER — Ambulatory Visit: Payer: BC Managed Care – PPO | Admitting: Radiation Oncology

## 2021-02-21 DIAGNOSIS — C50411 Malignant neoplasm of upper-outer quadrant of right female breast: Secondary | ICD-10-CM | POA: Diagnosis not present

## 2021-02-22 ENCOUNTER — Ambulatory Visit
Admission: RE | Admit: 2021-02-22 | Discharge: 2021-02-22 | Disposition: A | Discharge status: Home / Self Care | Payer: BC Managed Care – PPO | Source: Ambulatory Visit | Attending: Radiation Oncology | Admitting: Radiation Oncology

## 2021-02-22 ENCOUNTER — Ambulatory Visit: Payer: BC Managed Care – PPO

## 2021-02-22 DIAGNOSIS — C50411 Malignant neoplasm of upper-outer quadrant of right female breast: Secondary | ICD-10-CM | POA: Diagnosis not present

## 2021-02-23 ENCOUNTER — Ambulatory Visit: Payer: BC Managed Care – PPO

## 2021-02-23 ENCOUNTER — Ambulatory Visit
Admission: RE | Admit: 2021-02-23 | Discharge: 2021-02-23 | Disposition: A | Discharge status: Home / Self Care | Payer: BC Managed Care – PPO | Source: Ambulatory Visit | Attending: Radiation Oncology | Admitting: Radiation Oncology

## 2021-02-23 ENCOUNTER — Other Ambulatory Visit: Payer: Self-pay

## 2021-02-23 DIAGNOSIS — C50411 Malignant neoplasm of upper-outer quadrant of right female breast: Secondary | ICD-10-CM | POA: Diagnosis not present

## 2021-02-24 ENCOUNTER — Ambulatory Visit
Admission: RE | Admit: 2021-02-24 | Discharge: 2021-02-24 | Disposition: A | Discharge status: Home / Self Care | Payer: BC Managed Care – PPO | Source: Ambulatory Visit | Attending: Radiation Oncology | Admitting: Radiation Oncology

## 2021-02-24 DIAGNOSIS — C50411 Malignant neoplasm of upper-outer quadrant of right female breast: Secondary | ICD-10-CM | POA: Diagnosis not present

## 2021-02-25 ENCOUNTER — Encounter: Payer: Self-pay | Admitting: *Deleted

## 2021-02-25 ENCOUNTER — Ambulatory Visit
Admission: RE | Admit: 2021-02-25 | Discharge: 2021-02-25 | Disposition: A | Discharge status: Home / Self Care | Payer: BC Managed Care – PPO | Source: Ambulatory Visit | Attending: Radiation Oncology | Admitting: Radiation Oncology

## 2021-02-25 DIAGNOSIS — C50411 Malignant neoplasm of upper-outer quadrant of right female breast: Secondary | ICD-10-CM | POA: Diagnosis not present

## 2021-02-28 ENCOUNTER — Encounter: Payer: Self-pay | Admitting: Radiation Oncology

## 2021-02-28 ENCOUNTER — Other Ambulatory Visit: Payer: Self-pay

## 2021-02-28 ENCOUNTER — Ambulatory Visit
Admission: RE | Admit: 2021-02-28 | Discharge: 2021-02-28 | Disposition: A | Discharge status: Home / Self Care | Payer: BC Managed Care – PPO | Source: Ambulatory Visit | Attending: Radiation Oncology | Admitting: Radiation Oncology

## 2021-02-28 ENCOUNTER — Ambulatory Visit: Payer: BC Managed Care – PPO

## 2021-02-28 DIAGNOSIS — C50411 Malignant neoplasm of upper-outer quadrant of right female breast: Secondary | ICD-10-CM | POA: Diagnosis not present

## 2021-03-01 ENCOUNTER — Ambulatory Visit: Payer: BC Managed Care – PPO

## 2021-03-02 NOTE — Progress Notes (Signed)
[facility]  [phone] [phone]) [phone]     ID: Autumn Bullock DOB: [birth year]  MR#: [id number]  TXM#:[id number]  Patient Care Team: Brock Ra, PA-C as PCP - General Mauro Kaufmann, RN as Oncology Nurse Navigator Rockwell Germany, RN as Oncology Nurse Navigator Donnie Mesa, MD as Consulting Physician (General Surgery) Eppie Gibson, MD as Attending Physician (Radiation Oncology) Brionne Mertz, Virgie Dad, MD as Consulting Physician (Oncology) Vanessa Kick, MD as Consulting Physician (Obstetrics and Gynecology) Lovett Calender, MD as Consulting Physician (Orthopedic Surgery) Chauncey Cruel, MD OTHER MD:  CHIEF COMPLAINT: Estrogen receptor positive breast cancer  CURRENT TREATMENT: Tamoxifen   INTERVAL HISTORY: Autumn Bullock returns today for follow up of her estrogen receptor positive breast cancer.    Since her last visit, she was referred back to Dr. Isidore Moos on 12/31/2020 to review radiation therapy. She subsequently received treatment from 01/17/2021 through 02/28/2021.  She generally tolerated treatment well but did note that she was a bit more fatigued than she expected.  Also she had some peeling so she cannot wear a bra and this keeps her from going to the gym.   REVIEW OF SYSTEMS: [name]'s hair is coming in full and dark.  She also has a strange feeling all over her skin because her follicles appear to be active.  She lost her eyelashes more than once but the ones she has now appear to be growing normally.  She is walking for exercise.  She is lost 10 pounds and would like to lose another 5.  Hot flashes are "terrible" but they have improved during the day with Effexor.  There is still waking her up at night however.  A detailed review of systems was otherwise stable   COVID 19 VACCINATION STATUS: Status post Pfizer x2, most recent treatment April 2021.  Patient also was diagnosed with COVID-19 disease November 2020   HISTORY OF CURRENT  ILLNESS: From the original intake note:  Autumn Bullock had routine screening mammography on 06/02/2020 showing a possible abnormality in the right breast. She underwent right diagnostic mammography with tomography and right breast ultrasonography at [facility] on 06/08/2020 showing: breast density category C; 2.3 cm right breast mass at 11:30; 0.8 cm mass superficial to dominant mass; no right axillary adenopathy.  Accordingly on 06/08/2020 she proceeded to biopsy of the right breast area in question. The pathology from this procedure ([id number]) showed: invasive and in situ mammary carcinoma, grade 2, e-cadherin positive. Both biopsied masses showed this and were found to be morphologically similar. Prognostic indicators significant for: estrogen receptor, 90% positive and progesterone receptor, 70% positive, both with strong staining intensity. Proliferation marker Ki67 at 5%. HER2 negative by immunohistochemistry (1+).  The patient's subsequent history is as detailed below.   PAST MEDICAL HISTORY: Past Medical History:  Diagnosis Date   Family history of uterine cancer 06/16/2020   PONV (postoperative nausea and vomiting)     PAST SURGICAL HISTORY: Past Surgical History:  Procedure Laterality Date   BREAST LUMPECTOMY WITH RADIOACTIVE SEED AND SENTINEL LYMPH NODE BIOPSY Right 07/14/2020   Procedure: RIGHT BREAST LUMPECTOMY WITH BRACKETED RADIOACTIVE SEED AND SENTINEL LYMPH NODE BIOPSY, BLUE DYE INJECTION;  Surgeon: Donnie Mesa, MD;  Location: [facility];  Service: General;  Laterality: Right;  PEC BLOCK, BLUE DYE INJECTION   CESAREAN SECTION     2011   PORTACATH PLACEMENT Right 07/14/2020   Procedure: INSERTION PORT-A-CATH WITH ULTRASOUND GUIDANCE;  Surgeon: Donnie Mesa, MD;  Location: [facility];  Service: General;  Laterality: Right;   RE-EXCISION OF BREAST LUMPECTOMY Right 08/03/2020   Procedure: RE-EXCISION OF RIGHT BREAST LUMPECTOMY SUPERIOR  MARGINS;  Surgeon: Donnie Mesa, MD;  Location: [facility];  Service: General;  Laterality: Right;  LMA    FAMILY HISTORY: Family History  Problem Relation Age of Onset   Cancer Maternal Grandmother 69       Unknown GYN.  ?Uterine  As of October 2021 her parents are both living, her father at age 36 and her mother at 57, . Autumn Bullock has 1 brother and 2 sisters. She reports uterine cancer in her maternal grandmother at age 70. There is no family history of breast, ovarian, or colon cancer to her knowledge.   GYNECOLOGIC HISTORY:  No LMP recorded. Menarche: 45 years old Age at first live birth: 45 years old [place] P 1 LMP 05/05/2020, regular monthly periods, lasting 3 days with 1-2 heavy days Contraceptive: has used for 15 years, no complications HRT n/a  Hysterectomy? no BSO? 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 Husband Autumn Bullock works in Nature conservation officer. She lives at home with Autumn Bullock and their daughter Autumn Bullock, age 85. She attends [employer] in [place].    ADVANCED DIRECTIVES: In the absence of any documentation to the contrary, the patient's spouse is their HCPOA.    HEALTH MAINTENANCE: Social History   Tobacco Use   Smoking status: Never   Smokeless tobacco: Never  Vaping Use   Vaping Use: Never used  Substance Use Topics   Alcohol use: Yes    Comment: 7/week   Drug use: Never     Colonoscopy: 1999?  PAP: 05/31/2020  Bone density: never done   Allergies  Allergen Reactions   Latex    Wound Dressing Adhesive     Tape etc     Current Outpatient Medications  Medication Sig Dispense Refill   doxycycline (VIBRA-TABS) 100 MG tablet Take 1 tablet (100 mg total) by mouth 2 (two) times daily. 60 tablet 1   amoxicillin-clavulanate (AUGMENTIN) 500-125 MG tablet TAKE 1 TABLET BY MOUTH 3 TIMES DAILY FOR 7 DAYS. 21 tablet 0   docusate sodium (COLACE) 100 MG capsule TAKE 2 CAPSULES BY  MOUTH TWICE A DAY (Patient not taking: No sig reported) 80 capsule 0   lidocaine (XYLOCAINE) 2 % jelly Apply topically 4  times daily as needed. 30 mL 3   lidocaine-prilocaine (EMLA) cream APPLY TO AFFECTED AREA ONCE AS DIRECTED (Patient not taking: Reported on 11/25/2020) 30 g 3   metroNIDAZOLE (METROGEL) 1 % gel APPLY TO AFFECTED AREAS DAILY AS DIRECTED (Patient not taking: No sig reported) 60 g 0   omeprazole (PRILOSEC) 40 MG capsule TAKE 1 CAPSULE (40 MG TOTAL) BY MOUTH AT BEDTIME. (Patient not taking: No sig reported) 90 capsule 4   ondansetron (ZOFRAN-ODT) 4 MG disintegrating tablet DISSOLVE 1 TABLET (4 MG TOTAL) BY MOUTH EVERY 8 (EIGHT) HOURS AS NEEDED FOR NAUSEA OR VOMITING. (Patient not taking: No sig reported) 20 tablet 2   phentermine 37.5 MG capsule Take 1 capsule by mouth daily.     prochlorperazine (COMPAZINE) 10 MG tablet TAKE 1 TABLET BY MOUTH EVERY 6 HOURS AS NEEDED FOR NAUSEA AND/OR VOMITING (Patient not taking: No sig reported) 30 tablet 1   venlafaxine XR (EFFEXOR-XR) 37.5 MG 24 hr capsule Take 1 capsule (37.5 mg total) by mouth daily with breakfast. 90 capsule 1   No current facility-administered medications for this  visit.    OBJECTIVE: White woman in no acute distress Vitals:   03/03/21 0919  BP: 109/77  Pulse: (!) 59  Resp: 18  Temp: (!) 97.5 F (36.4 C)  SpO2: 100%     Body mass index is 26.54 kg/m.   Wt Readings from Last 3 Encounters:  03/03/21 159 lb 8 oz (72.3 kg)  12/31/20 164 lb 4 oz (74.5 kg)  12/30/20 165 lb 1.6 oz (74.9 kg)     ECOG FS:1 - Symptomatic but completely ambulatory  Sclerae unicteric, EOMs intact Wearing a mask No cervical or supraclavicular adenopathy Lungs no rales or rhonchi Heart regular rate and rhythm Abd soft, nontender, positive bowel sounds MSK no focal spinal tenderness, no upper extremity lymphedema Neuro: nonfocal, well oriented, appropriate affect Breasts: The right breast is status post lumpectomy and radiation.  There  is still desquamation and hyperpigmentation.  There is no erythema.  The cosmetic result overall is very good.  Left breast and both axillae are benign.   LAB RESULTS:  CMP     Component Value Date/Time   NA 141 01/17/2021 1352   K 3.7 01/17/2021 1352   CL 107 01/17/2021 1352   CO2 27 01/17/2021 1352   GLUCOSE 97 01/17/2021 1352   BUN 19 01/17/2021 1352   CREATININE 0.81 01/17/2021 1352   CREATININE 0.73 08/17/2020 0905   CALCIUM 9.1 01/17/2021 1352   PROT 6.9 01/17/2021 1352   ALBUMIN 4.2 01/17/2021 1352   AST 23 01/17/2021 1352   AST 18 08/17/2020 0905   ALT 24 01/17/2021 1352   ALT 18 08/17/2020 0905   ALKPHOS 58 01/17/2021 1352   BILITOT 0.4 01/17/2021 1352   BILITOT 0.7 08/17/2020 0905   GFRNONAA >60 01/17/2021 1352   GFRNONAA >60 08/17/2020 0905    No results found for: TOTALPROTELP, ALBUMINELP, A1GS, A2GS, BETS, BETA2SER, GAMS, MSPIKE, SPEI  Lab Results  Component Value Date   WBC 2.6 (L) 03/03/2021   NEUTROABS 1.6 (L) 03/03/2021   HGB 12.7 03/03/2021   HCT 35.8 (L) 03/03/2021   MCV 92.3 03/03/2021   PLT 230 03/03/2021    No results found for: LABCA2  No components found for: [id number]  No results for input(s): INR in the last 168 hours.  No results found for: LABCA2  No results found for: VOP929  No results found for: WKM628  No results found for: MNO177  No results found for: CA2729  No components found for: HGQUANT  No results found for: CEA1 / No results found for: CEA1   No results found for: AFPTUMOR  No results found for: CHROMOGRNA  No results found for: KPAFRELGTCHN, LAMBDASER, KAPLAMBRATIO (kappa/lambda light chains)  No results found for: HGBA, HGBA2QUANT, HGBFQUANT, HGBSQUAN (Hemoglobinopathy evaluation)   No results found for: LDH  No results found for: IRON, TIBC, IRONPCTSAT (Iron and TIBC)  No results found for: FERRITIN  Urinalysis    Component Value Date/Time   COLORURINE YELLOW 09/02/2020 0843   APPEARANCEUR  HAZY (A) 09/02/2020 0843   LABSPEC 1.010 09/02/2020 0843   PHURINE 6.0 09/02/2020 0843   GLUCOSEU NEGATIVE 09/02/2020 0843   HGBUR SMALL (A) 09/02/2020 0843   BILIRUBINUR NEGATIVE 09/02/2020 0843   KETONESUR NEGATIVE 09/02/2020 0843   PROTEINUR NEGATIVE 09/02/2020 0843   NITRITE NEGATIVE 09/02/2020 0843   LEUKOCYTESUR TRACE (A) 09/02/2020 0843    STUDIES: No results found.   ELIGIBLE FOR AVAILABLE RESEARCH PROTOCOL: AET  ASSESSMENT: 45 y.o. [place] woman status post right breast upper outer quadrant  biopsy 06/08/2020 for a clinical mT2 N0, stage IB invasive ductal carcinoma, grade 2, estrogen and progesterone receptor positive, HER-2 not amplified, with an MIB-1 of 5%.  (1) genetics testing 06/22/2020 through the Common Hereditary Cancers Panel offered by [facility] found no deleterious mutations in APC, ATM, AXIN2, BARD1, BMPR1A, BRCA1, BRCA2, BRIP1, CDH1, CDK4, CDKN2A (p14ARF), CDKN2A (p16INK4a), CHEK2, CTNNA1, DICER1, EPCAM (Deletion/duplication testing only), GREM1 (promoter region deletion/duplication testing only), KIT, MEN1, MLH1, MSH2, MSH3, MSH6, MUTYH, NBN, NF1, NHTL1, PALB2, PDGFRA, PMS2, POLD1, POLE, PTEN, RAD50, RAD51C, RAD51D, RNF43, SDHB, SDHC, SDHD, SMAD4, SMARCA4. STK11, TP53, TSC1, TSC2, and VHL.  The following genes were evaluated for sequence changes only: SDHA and HOXB13 c.251G>A variant only.  (a)  a variant of uncertain significance in MSH3 at c.2731T>G (p.Leu911Val).   (2) right lumpectomy and sentinel lymph node sampling 07/14/2020 showed a pT2 pN1(mic), stage IIA invasive ductal carcinoma, grade 2, with a positive superior margin  (a) additional surgery 08/03/2020  (3) MammaPrint obtained from the initial biopsy showed high risk, predicting a 93% 5-year disease-free survival with chemotherapy and significant benefit from chemotherapy (greater than 12%).  (4) cyclophosphamide and doxorubicin in dose dense fashion x4 started 10/15/2020 08/17/2020, completed  09/28/2020, followed by paclitaxel weekly x12 started 10/15/2020, completed 12/30/2020  (a) echo 07/09/2020 shows an ejection fraction in the 60-65% range.  (5) adjuvant radiation 01/17/2021 - 02/28/2021  (6) tamoxifen to start 03/17/2021   PLAN: Dorothie has completed her local treatments.  She is still recovering from radiation and I think it would be wise to wait a couple more weeks before starting tamoxifen.  She has a good understanding of the possible toxicities side effects and complications of this agent.  She also knows that it is not a contraceptive.  She is encouraging her husband to get a vasectomy.  As soon as she is able to wear a bra she plans to get back to the gym.  She is walking for exercise right now.  She will get back to work late August and is very much looking forward to that.  I am going to see her again in late September just to make sure she is tolerating tamoxifen well.  At this point I am adding gabapentin at bedtime which I think will make it easier for her to sleep through the night without being woken up by hot flashes  Total encounter time 25 minutes.  Virgie Dad. Gao Mitnick, MD 03/03/21 9:34 AM Medical Oncology and Hematology [facility] [place], [place] [address] Tel. [phone]    Fax. [phone]   I, Wilburn Mylar, am acting as scribe for Dr. Virgie Dad. Prospero Mahnke.  I, Lurline Del MD, have reviewed the above documentation for accuracy and completeness, and I agree with the above.   *Total Encounter Time as defined by the Centers for Medicare and Medicaid Services includes, in addition to the face-to-face time of a patient visit (documented in the note above) non-face-to-face time: obtaining and reviewing outside history, ordering and reviewing medications, tests or procedures, care coordination (communications with other health care professionals or caregivers) and documentation in the medical record.

## 2021-03-03 ENCOUNTER — Inpatient Hospital Stay (HOSPITAL_BASED_OUTPATIENT_CLINIC_OR_DEPARTMENT_OTHER): Payer: BC Managed Care – PPO | Admitting: Oncology

## 2021-03-03 ENCOUNTER — Telehealth: Payer: Self-pay | Admitting: Oncology

## 2021-03-03 ENCOUNTER — Ambulatory Visit: Payer: Self-pay | Admitting: Surgery

## 2021-03-03 ENCOUNTER — Inpatient Hospital Stay: Payer: BC Managed Care – PPO | Attending: Oncology

## 2021-03-03 ENCOUNTER — Other Ambulatory Visit: Payer: Self-pay

## 2021-03-03 VITALS — BP 109/77 | HR 59 | Temp 97.5°F | Resp 18 | Ht 65.0 in | Wt 159.5 lb

## 2021-03-03 DIAGNOSIS — Z9221 Personal history of antineoplastic chemotherapy: Secondary | ICD-10-CM | POA: Insufficient documentation

## 2021-03-03 DIAGNOSIS — Z923 Personal history of irradiation: Secondary | ICD-10-CM | POA: Diagnosis not present

## 2021-03-03 DIAGNOSIS — Z79810 Long term (current) use of selective estrogen receptor modulators (SERMs): Secondary | ICD-10-CM | POA: Diagnosis not present

## 2021-03-03 DIAGNOSIS — Z170 Estrogen receptor positive status [ER+]: Secondary | ICD-10-CM

## 2021-03-03 DIAGNOSIS — Z79899 Other long term (current) drug therapy: Secondary | ICD-10-CM | POA: Insufficient documentation

## 2021-03-03 DIAGNOSIS — N951 Menopausal and female climacteric states: Secondary | ICD-10-CM | POA: Insufficient documentation

## 2021-03-03 DIAGNOSIS — C50411 Malignant neoplasm of upper-outer quadrant of right female breast: Secondary | ICD-10-CM | POA: Insufficient documentation

## 2021-03-03 DIAGNOSIS — Z95828 Presence of other vascular implants and grafts: Secondary | ICD-10-CM

## 2021-03-03 LAB — CBC WITH DIFFERENTIAL/PLATELET
Abs Immature Granulocytes: 0.01 10*3/uL (ref 0.00–0.07)
Basophils Absolute: 0 10*3/uL (ref 0.0–0.1)
Basophils Relative: 1 %
Eosinophils Absolute: 0.2 10*3/uL (ref 0.0–0.5)
Eosinophils Relative: 6 %
HCT: 35.8 % — ABNORMAL LOW (ref 36.0–46.0)
Hemoglobin: 12.7 g/dL (ref 12.0–15.0)
Immature Granulocytes: 0 %
Lymphocytes Relative: 18 %
Lymphs Abs: 0.5 10*3/uL — ABNORMAL LOW (ref 0.7–4.0)
MCH: 32.7 pg (ref 26.0–34.0)
MCHC: 35.5 g/dL (ref 30.0–36.0)
MCV: 92.3 fL (ref 80.0–100.0)
Monocytes Absolute: 0.4 10*3/uL (ref 0.1–1.0)
Monocytes Relative: 14 %
Neutro Abs: 1.6 10*3/uL — ABNORMAL LOW (ref 1.7–7.7)
Neutrophils Relative %: 61 %
Platelets: 230 10*3/uL (ref 150–400)
RBC: 3.88 MIL/uL (ref 3.87–5.11)
RDW: 12.2 % (ref 11.5–15.5)
WBC: 2.6 10*3/uL — ABNORMAL LOW (ref 4.0–10.5)
nRBC: 0 % (ref 0.0–0.2)

## 2021-03-03 LAB — COMPREHENSIVE METABOLIC PANEL
ALT: 22 U/L (ref 0–44)
AST: 28 U/L (ref 15–41)
Albumin: 4 g/dL (ref 3.5–5.0)
Alkaline Phosphatase: 73 U/L (ref 38–126)
Anion gap: 11 (ref 5–15)
BUN: 13 mg/dL (ref 6–20)
CO2: 22 mmol/L (ref 22–32)
Calcium: 8.9 mg/dL (ref 8.9–10.3)
Chloride: 106 mmol/L (ref 98–111)
Creatinine, Ser: 0.71 mg/dL (ref 0.44–1.00)
GFR, Estimated: >60 mL/min (ref >60)
Glucose, Bld: 109 mg/dL — ABNORMAL HIGH (ref 70–99)
Potassium: 4 mmol/L (ref 3.5–5.1)
Sodium: 139 mmol/L (ref 135–145)
Total Bilirubin: 0.4 mg/dL (ref 0.3–1.2)
Total Protein: 7.1 g/dL (ref 6.5–8.1)

## 2021-03-03 MED ORDER — SODIUM CHLORIDE 0.9% FLUSH
10.0000 mL | Freq: Once | INTRAVENOUS | Status: AC
  Administered 2021-03-03: 10 mL
  Filled 2021-03-03: qty 10

## 2021-03-03 MED ORDER — GABAPENTIN 300 MG PO CAPS: 300.0000 mg | ORAL_CAPSULE | Freq: Every day | ORAL | 4 refills | Status: DC

## 2021-03-03 MED ORDER — TAMOXIFEN CITRATE 20 MG PO TABS: 20.0000 mg | ORAL_TABLET | Freq: Every day | ORAL | 4 refills | Status: AC

## 2021-03-03 NOTE — Telephone Encounter (Signed)
Scheduled appointment per 06/30 los. Patient is aware.

## 2021-03-10 ENCOUNTER — Other Ambulatory Visit: Payer: Self-pay

## 2021-03-10 ENCOUNTER — Encounter (HOSPITAL_BASED_OUTPATIENT_CLINIC_OR_DEPARTMENT_OTHER): Payer: Self-pay | Admitting: Surgery

## 2021-03-16 ENCOUNTER — Encounter: Payer: Self-pay | Admitting: Oncology

## 2021-03-17 ENCOUNTER — Encounter (HOSPITAL_BASED_OUTPATIENT_CLINIC_OR_DEPARTMENT_OTHER)
Admission: RE | Disposition: A | Discharge status: Home / Self Care | Payer: Self-pay | Source: Home / Self Care | Attending: Surgery

## 2021-03-17 ENCOUNTER — Ambulatory Visit (HOSPITAL_BASED_OUTPATIENT_CLINIC_OR_DEPARTMENT_OTHER): Payer: BC Managed Care – PPO | Admitting: Anesthesiology

## 2021-03-17 ENCOUNTER — Ambulatory Visit (HOSPITAL_BASED_OUTPATIENT_CLINIC_OR_DEPARTMENT_OTHER)
Admission: RE | Admit: 2021-03-17 | Discharge: 2021-03-17 | Disposition: A | Discharge status: Home / Self Care | Payer: BC Managed Care – PPO | Attending: Surgery | Admitting: Surgery

## 2021-03-17 ENCOUNTER — Encounter (HOSPITAL_BASED_OUTPATIENT_CLINIC_OR_DEPARTMENT_OTHER): Payer: Self-pay | Admitting: Surgery

## 2021-03-17 ENCOUNTER — Other Ambulatory Visit: Payer: Self-pay

## 2021-03-17 DIAGNOSIS — Z9221 Personal history of antineoplastic chemotherapy: Secondary | ICD-10-CM | POA: Insufficient documentation

## 2021-03-17 DIAGNOSIS — Z452 Encounter for adjustment and management of vascular access device: Secondary | ICD-10-CM | POA: Diagnosis not present

## 2021-03-17 DIAGNOSIS — C50411 Malignant neoplasm of upper-outer quadrant of right female breast: Secondary | ICD-10-CM | POA: Diagnosis not present

## 2021-03-17 DIAGNOSIS — Z91040 Latex allergy status: Secondary | ICD-10-CM | POA: Insufficient documentation

## 2021-03-17 DIAGNOSIS — Z79899 Other long term (current) drug therapy: Secondary | ICD-10-CM | POA: Insufficient documentation

## 2021-03-17 DIAGNOSIS — Z170 Estrogen receptor positive status [ER+]: Secondary | ICD-10-CM | POA: Insufficient documentation

## 2021-03-17 DIAGNOSIS — Z91048 Other nonmedicinal substance allergy status: Secondary | ICD-10-CM | POA: Diagnosis not present

## 2021-03-17 DIAGNOSIS — Z8049 Family history of malignant neoplasm of other genital organs: Secondary | ICD-10-CM | POA: Insufficient documentation

## 2021-03-17 HISTORY — DX: Malignant (primary) neoplasm, unspecified: C80.1

## 2021-03-17 HISTORY — PX: PORT-A-CATH REMOVAL: SHX5289

## 2021-03-17 LAB — POCT PREGNANCY, URINE: Preg Test, Ur: NEGATIVE

## 2021-03-17 SURGERY — REMOVAL PORT-A-CATH
Anesthesia: Monitor Anesthesia Care | Site: Breast

## 2021-03-17 MED ORDER — MIDAZOLAM HCL 2 MG/2ML IJ SOLN
INTRAMUSCULAR | Status: AC
  Filled 2021-03-17: qty 2

## 2021-03-17 MED ORDER — FENTANYL CITRATE (PF) 100 MCG/2ML IJ SOLN
INTRAMUSCULAR | Status: AC
  Filled 2021-03-17: qty 2

## 2021-03-17 MED ORDER — ONDANSETRON HCL 4 MG/2ML IJ SOLN
INTRAMUSCULAR | Status: AC
  Filled 2021-03-17: qty 2

## 2021-03-17 MED ORDER — BUPIVACAINE-EPINEPHRINE 0.25% -1:200000 IJ SOLN
INTRAMUSCULAR | Status: DC | PRN
  Administered 2021-03-17: 6 mL

## 2021-03-17 MED ORDER — PROPOFOL 500 MG/50ML IV EMUL
INTRAVENOUS | Status: AC
  Filled 2021-03-17: qty 50

## 2021-03-17 MED ORDER — PROPOFOL 10 MG/ML IV BOLUS
INTRAVENOUS | Status: DC | PRN
  Administered 2021-03-17 (×2): 20 mg via INTRAVENOUS
  Administered 2021-03-17: 40 mg via INTRAVENOUS
  Administered 2021-03-17: 20 mg via INTRAVENOUS

## 2021-03-17 MED ORDER — CHLORHEXIDINE GLUCONATE CLOTH 2 % EX PADS: 6.0000 | MEDICATED_PAD | Freq: Once | CUTANEOUS | Status: DC

## 2021-03-17 MED ORDER — ONDANSETRON HCL 4 MG/2ML IJ SOLN
INTRAMUSCULAR | Status: DC | PRN
  Administered 2021-03-17: 4 mg via INTRAVENOUS

## 2021-03-17 MED ORDER — PROPOFOL 500 MG/50ML IV EMUL
INTRAVENOUS | Status: DC | PRN
  Administered 2021-03-17: 125 ug/kg/min via INTRAVENOUS

## 2021-03-17 MED ORDER — LACTATED RINGERS IV SOLN: INTRAVENOUS | Status: DC

## 2021-03-17 SURGICAL SUPPLY — 40 items
APL PRP STRL LF DISP 70% ISPRP (MISCELLANEOUS) ×1
APL SKNCLS STERI-STRIP NONHPOA (GAUZE/BANDAGES/DRESSINGS) ×1
APL SWBSTK 6 STRL LF DISP (MISCELLANEOUS)
APPLICATOR COTTON TIP 6 STRL (MISCELLANEOUS) IMPLANT
APPLICATOR COTTON TIP 6IN STRL (MISCELLANEOUS)
BENZOIN TINCTURE PRP APPL 2/3 (GAUZE/BANDAGES/DRESSINGS) ×2 IMPLANT
BLADE HEX COATED 2.75 (ELECTRODE) IMPLANT
BLADE SURG 15 STRL LF DISP TIS (BLADE) ×1 IMPLANT
BLADE SURG 15 STRL SS (BLADE) ×2
CANISTER SUCT 1200ML W/VALVE (MISCELLANEOUS) IMPLANT
CHLORAPREP W/TINT 26 (MISCELLANEOUS) ×2 IMPLANT
COVER BACK TABLE 60X90IN (DRAPES) ×2 IMPLANT
COVER MAYO STAND STRL (DRAPES) ×2 IMPLANT
DECANTER SPIKE VIAL GLASS SM (MISCELLANEOUS) IMPLANT
DRAPE LAPAROTOMY 100X72 PEDS (DRAPES) ×2 IMPLANT
DRAPE UTILITY XL STRL (DRAPES) ×2 IMPLANT
DRSG TEGADERM 2-3/8X2-3/4 SM (GAUZE/BANDAGES/DRESSINGS) ×2 IMPLANT
DRSG TEGADERM 4X4.75 (GAUZE/BANDAGES/DRESSINGS) IMPLANT
ELECT REM PT RETURN 9FT ADLT (ELECTROSURGICAL) ×2
ELECTRODE REM PT RTRN 9FT ADLT (ELECTROSURGICAL) ×1 IMPLANT
GAUZE SPONGE 4X4 12PLY STRL LF (GAUZE/BANDAGES/DRESSINGS) IMPLANT
GLOVE SURG ENC MOIS LTX SZ7 (GLOVE) IMPLANT
GLOVE SURG UNDER POLY LF SZ7.5 (GLOVE) ×2 IMPLANT
GOWN STRL REUS W/ TWL LRG LVL3 (GOWN DISPOSABLE) ×2 IMPLANT
GOWN STRL REUS W/TWL LRG LVL3 (GOWN DISPOSABLE) ×4
NEEDLE HYPO 25X1 1.5 SAFETY (NEEDLE) ×2 IMPLANT
NS IRRIG 1000ML POUR BTL (IV SOLUTION) IMPLANT
PACK BASIN DAY SURGERY FS (CUSTOM PROCEDURE TRAY) ×2 IMPLANT
PENCIL SMOKE EVACUATOR (MISCELLANEOUS) ×2 IMPLANT
SPONGE GAUZE 2X2 8PLY STRL LF (GAUZE/BANDAGES/DRESSINGS) ×2 IMPLANT
SPONGE T-LAP 4X18 ~~LOC~~+RFID (SPONGE) ×2 IMPLANT
STRIP CLOSURE SKIN 1/2X4 (GAUZE/BANDAGES/DRESSINGS) ×2 IMPLANT
SUT MON AB 4-0 PC3 18 (SUTURE) ×2 IMPLANT
SUT VIC AB 3-0 SH 27 (SUTURE) ×2
SUT VIC AB 3-0 SH 27X BRD (SUTURE) ×1 IMPLANT
SYR BULB EAR ULCER 3OZ GRN STR (SYRINGE) IMPLANT
SYR CONTROL 10ML LL (SYRINGE) ×2 IMPLANT
TOWEL GREEN STERILE FF (TOWEL DISPOSABLE) ×2 IMPLANT
TUBE CONNECTING 20X1/4 (TUBING) IMPLANT
YANKAUER SUCT BULB TIP NO VENT (SUCTIONS) IMPLANT

## 2021-03-17 NOTE — Op Note (Signed)
Preop diagnosis: Right breast cancer status post chemotherapy Postop diagnosis: Same Procedure performed: Port removal Surgeon:Reneta Niehaus K Jamarco Zaldivar Anesthesia: Local MAC Indications:This is a healthy 45 year old female who presents after routine screening mammogram revealed two suspicious areas in the right upper outer quadrant.  She underwent further evaluation with mammogram and [place] that revealed a mass at 11:30 in the right breast 2.3 x 1.5 x 1.7 cm 3 cmfn.  Just superficial to this area, there is a second 0.8 x 0.6 x 0.7 cm mass.  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    She underwent lumpectomy/ SLNB/ port placement in 11/21.  She had a positive superior margin that was reexcised.  She has completed chemotherapy and now presents for port removal.   Description of procedure: The patient was brought to the operating room and placed in the supine position on the operating room table.  After an adequate level of intravenous sedation was obtained, her right anterior chest was prepped with ChloraPrep and draped in sterile fashion.  A timeout was taken to ensure the proper patient and proper procedure.  We infiltrated the area over the port with 0.25% Marcaine with epinephrine.  I opened her old incision.  We dissected down to the port with cautery.  The 2 Prolene stay sutures were removed.  The port was removed entirely.  Direct pressure was held against the side of the neck over the jugular vein for a couple of minutes.  No bleeding was noted.  The capsule of the port was excised.  We inspected for hemostasis.  The wound was closed with 3-0 Vicryl 4-0 Monocryl.  Benzoin and Steri-Strips were applied.  The patient was then brought to the recovery room in stable condition.  All counts were correct.  Imogene Burn. Georgette Dover, MD, [facility] Surgery  General Surgery   03/17/2021 11:57 AM

## 2021-03-17 NOTE — Anesthesia Postprocedure Evaluation (Signed)
Anesthesia Post Note  Patient: Autumn Bullock  Procedure(s) Performed: REMOVAL PORT-A-CATH (Breast)     Patient location during evaluation: PACU Anesthesia Type: MAC Level of consciousness: awake and alert Pain management: pain level controlled Vital Signs Assessment: post-procedure vital signs reviewed and stable Respiratory status: spontaneous breathing, nonlabored ventilation and respiratory function stable Cardiovascular status: blood pressure returned to baseline and stable Postop Assessment: no apparent nausea or vomiting Anesthetic complications: no   No notable events documented.  Last Vitals:  Vitals:   03/17/21 1215 03/17/21 1230  BP: 100/66 131/65  Pulse: 61 (!) 58  Resp: 12 20  Temp:  36.6 C  SpO2: 99% 98%    Last Pain:  Vitals:   03/17/21 1230  TempSrc: Oral  PainSc: 0-No pain                 Lidia Collum

## 2021-03-17 NOTE — Transfer of Care (Signed)
Immediate Anesthesia Transfer of Care Note  Patient: Autumn Bullock  Procedure(s) Performed: REMOVAL PORT-A-CATH (Breast)  Patient Location: PACU  Anesthesia Type:MAC  Level of Consciousness: awake, alert  and oriented  Airway & Oxygen Therapy: Patient Spontanous Breathing and Patient connected to face mask oxygen  Post-op Assessment: Report given to RN and Post -op Vital signs reviewed and stable  Post vital signs: Reviewed and stable  Last Vitals:  Vitals Value Taken Time  BP 96/67 03/17/21 1200  Temp    Pulse 62 03/17/21 1201  Resp 19 03/17/21 1201  SpO2 100 % 03/17/21 1201  Vitals shown include unvalidated device data.  Last Pain: There were no vitals filed for this visit.       Complications: No notable events documented.

## 2021-03-17 NOTE — Discharge Instructions (Addendum)
[place] Office Phone Number [phone]  POST OP INSTRUCTIONS  Always review your discharge instruction sheet given to you by the facility where your surgery was performed.  IF YOU HAVE DISABILITY OR FAMILY LEAVE FORMS, YOU MUST BRING THEM TO THE OFFICE FOR PROCESSING.  DO NOT GIVE THEM TO YOUR DOCTOR.  You may take acetaminophen (Tylenol) or ibuprofen (Advil) as needed. Take your usually prescribed medications unless otherwise directed Most patients will experience some swelling and bruising around the surgical site.  Ice packs will help.  Swelling and bruising can take several days to resolve.  You may remove your bandages 48 hours after surgery, and you may shower at that time.  You will have steri-strips (small skin tapes) in place directly over the incision.  These strips should be left on the skin for 7-10 days.   ACTIVITIES:  You may resume regular daily activities (gradually increasing) beginning the next day.   You may have sexual intercourse when it is comfortable. You may drive when you no longer are taking prescription pain medication, you can comfortably wear a seatbelt, and you can safely maneuver your car and apply brakes. RETURN TO WORK:  1-2 weeks You should see your doctor in the office for a follow-up appointment approximately two to three weeks after your surgery.    WHEN TO CALL YOUR DOCTOR: Fever over 101.0 Nausea and/or vomiting. Extreme swelling or bruising. Continued bleeding from incision. Increased pain, redness, or drainage from the incision.  The clinic staff is available to answer your questions during regular business hours.  Please don't hesitate to call and ask to speak to one of the nurses for clinical concerns.  If you have a medical emergency, go to the nearest emergency room or call 911.  A surgeon from [facility] is always on call at the hospital.  For further questions, please visit [email or web address]     Post Anesthesia Home Care Instructions  Activity: Get plenty of rest for the remainder of the day. A responsible individual must stay with you for 24 hours following the procedure.  For the next 24 hours, DO NOT: -Drive a car -Paediatric nurse -Drink alcoholic beverages -Take any medication unless instructed by your physician -Make any legal decisions or sign important papers.  Meals: Start with liquid foods such as gelatin or soup. Progress to regular foods as tolerated. Avoid greasy, spicy, heavy foods. If nausea and/or vomiting occur, drink only clear liquids until the nausea and/or vomiting subsides. Call your physician if vomiting continues.  Special Instructions/Symptoms: Your throat may feel dry or sore from the anesthesia or the breathing tube placed in your throat during surgery. If this causes discomfort, gargle with warm salt water. The discomfort should disappear within 24 hours.  If you had a scopolamine patch placed behind your ear for the management of post- operative nausea and/or vomiting:  1. The medication in the patch is effective for 72 hours, after which it should be removed.  Wrap patch in a tissue and discard in the trash. Wash hands thoroughly with soap and water. 2. You may remove the patch earlier than 72 hours if you experience unpleasant side effects which may include dry mouth, dizziness or visual disturbances. 3. Avoid touching the patch. Wash your hands with soap and water after contact with the patch.

## 2021-03-17 NOTE — H&P (Signed)
Autumn Bullock is an 45 y.o. female.   Chief Complaint:  Breast cancer s/p chemotherapy HPI: This is a healthy 45 year old female who presents after routine screening mammogram revealed two suspicious areas in the right upper outer quadrant.  She underwent further evaluation with mammogram and [place] that revealed a mass at 11:30 in the right breast 2.3 x 1.5 x 1.7 cm 3 cmfn.  Just superficial to this area, there is a second 0.8 x 0.6 x 0.7 cm mass.  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   She underwent lumpectomy/ SLNB/ port placement in 11/21.  She had a positive superior margin that was reexcised.  She has completed chemotherapy and now presents for port removal.  Past Medical History:  Diagnosis Date   Cancer [facility])    breast   Family history of uterine cancer 06/16/2020   PONV (postoperative nausea and vomiting)     Past Surgical History:  Procedure Laterality Date   BREAST LUMPECTOMY WITH RADIOACTIVE SEED AND SENTINEL LYMPH NODE BIOPSY Right 07/14/2020   Procedure: RIGHT BREAST LUMPECTOMY WITH BRACKETED RADIOACTIVE SEED AND SENTINEL LYMPH NODE BIOPSY, BLUE DYE INJECTION;  Surgeon: Donnie Mesa, MD;  Location: [place];  Service: General;  Laterality: Right;  PEC BLOCK, BLUE DYE INJECTION   CESAREAN SECTION     2011   PORTACATH PLACEMENT Right 07/14/2020   Procedure: INSERTION PORT-A-CATH WITH ULTRASOUND GUIDANCE;  Surgeon: Donnie Mesa, MD;  Location: [place];  Service: General;  Laterality: Right;   RE-EXCISION OF BREAST LUMPECTOMY Right 08/03/2020   Procedure: RE-EXCISION OF RIGHT BREAST LUMPECTOMY SUPERIOR MARGINS;  Surgeon: Donnie Mesa, MD;  Location: [place];  Service: General;  Laterality: Right;  LMA    Family History  Problem Relation Age of Onset   Cancer Maternal Grandmother 38       Unknown GYN.  ?Uterine   Social History:  reports that she has never smoked. She has never used  smokeless tobacco. She reports current alcohol use. She reports that she does not use drugs.  Allergies:  Allergies  Allergen Reactions   Latex    Wound Dressing Adhesive     Tape etc     Medications Prior to Admission  Medication Sig Dispense Refill   venlafaxine XR (EFFEXOR-XR) 37.5 MG 24 hr capsule Take 1 capsule (37.5 mg total) by mouth daily with breakfast. 90 capsule 1   gabapentin (NEURONTIN) 300 MG capsule Take 1 capsule (300 mg total) by mouth at bedtime. 90 capsule 4   tamoxifen (NOLVADEX) 20 MG tablet Take 1 tablet (20 mg total) by mouth daily. Start 03/17/2021 90 tablet 4    Results for orders placed or performed during the hospital encounter of 03/17/21 (from the past 48 hour(s))  Pregnancy, urine POC     Status: None   Collection Time: 03/17/21 10:49 AM  Result Value Ref Range   Preg Test, Ur NEGATIVE NEGATIVE    Comment:        THE SENSITIVITY OF THIS METHODOLOGY IS >24 mIU/mL      Height 5' 5"  (1.651 m), weight 72 kg. Physical Exam  Sclerae unicteric, EOMs intact Wearing a mask No cervical or supraclavicular adenopathy Lungs no rales or rhonchi Heart regular rate and rhythm Abd soft, nontender, positive bowel sounds MSK no focal spinal tenderness, no upper extremity lymphedema Neuro: nonfocal, well oriented, appropriate affect Breasts: The right breast is status post lumpectomy and  radiation.  There is still desquamation and hyperpigmentation.  There is no erythema.  The cosmetic result overall is very good.  Left breast and both axillae are benign.  Assessment/Plan Breast cancer s/p chemotherapy.  Port removal.  The surgical procedure has been discussed with the patient.  Potential risks, benefits, alternative treatments, and expected outcomes have been explained.  All of the patient's questions at this time have been answered.  The likelihood of reaching the patient's treatment goal is good.  The patient understand the proposed surgical procedure and  wishes to proceed.   Maia Petties, MD 03/17/2021, 11:16 AM

## 2021-03-17 NOTE — Anesthesia Preprocedure Evaluation (Signed)
Anesthesia Evaluation  Patient identified by MRN, date of birth, ID band Patient awake    Reviewed: Allergy & Precautions, NPO status , Patient's Chart, lab work & pertinent test results  History of Anesthesia Complications (+) PONVNegative for: history of anesthetic complications  Airway Mallampati: II  TM Distance: >3 FB Neck ROM: Full    Dental   Pulmonary neg pulmonary ROS,    Pulmonary exam normal        Cardiovascular negative cardio ROS Normal cardiovascular exam     Neuro/Psych negative neurological ROS     GI/Hepatic negative GI ROS, Neg liver ROS,   Endo/Other  negative endocrine ROS  Renal/GU negative Renal ROS  negative genitourinary   Musculoskeletal negative musculoskeletal ROS (+)   Abdominal   Peds  Hematology negative hematology ROS (+)   Anesthesia Other Findings   Breast cancer s/p chemo  Reproductive/Obstetrics                             Anesthesia Physical Anesthesia Plan  ASA: 2  Anesthesia Plan: MAC   Post-op Pain Management:    Induction: Intravenous  PONV Risk Score and Plan: 3 and Propofol infusion, TIVA and Treatment may vary due to age or medical condition  Airway Management Planned: Natural Airway, Nasal Cannula and Simple Face Mask  Additional Equipment: None  Intra-op Plan:   Post-operative Plan:   Informed Consent: I have reviewed the patients History and Physical, chart, labs and discussed the procedure including the risks, benefits and alternatives for the proposed anesthesia with the patient or authorized representative who has indicated his/her understanding and acceptance.       Plan Discussed with:   Anesthesia Plan Comments:         Anesthesia Quick Evaluation

## 2021-03-28 ENCOUNTER — Ambulatory Visit: Payer: BC Managed Care – PPO | Attending: Surgery

## 2021-03-28 ENCOUNTER — Other Ambulatory Visit: Payer: Self-pay

## 2021-03-28 VITALS — Wt 158.4 lb

## 2021-03-28 DIAGNOSIS — Z483 Aftercare following surgery for neoplasm: Secondary | ICD-10-CM | POA: Insufficient documentation

## 2021-03-28 NOTE — Therapy (Signed)
[place], [place], [address] Phone: [phone]   Fax:  [phone]  Physical Therapy Treatment  Patient Details  Name: Autumn Bullock MRN: [id number] Date of Birth: 1976-04-06 Referring Provider (PT): Dr. Donnie Mesa   Encounter Date: 03/28/2021   PT End of Session - 03/28/21 1602     Visit Number 2   # unchanged due to screen only   PT Start Time 8242    PT Stop Time 3536    PT Time Calculation (min) 4 min             Past Medical History:  Diagnosis Date   Cancer ([place])    breast   Family history of uterine cancer 06/16/2020   PONV (postoperative nausea and vomiting)     Past Surgical History:  Procedure Laterality Date   BREAST LUMPECTOMY WITH RADIOACTIVE SEED AND SENTINEL LYMPH NODE BIOPSY Right 07/14/2020   Procedure: RIGHT BREAST LUMPECTOMY WITH BRACKETED RADIOACTIVE SEED AND SENTINEL LYMPH NODE BIOPSY, BLUE DYE INJECTION;  Surgeon: Donnie Mesa, MD;  Location: [place];  Service: General;  Laterality: Right;  PEC BLOCK, BLUE DYE INJECTION   CESAREAN SECTION     2011   PORT-A-CATH REMOVAL N/A 03/17/2021   Procedure: REMOVAL PORT-A-CATH;  Surgeon: Donnie Mesa, MD;  Location: [place];  Service: General;  Laterality: N/A;   PORTACATH PLACEMENT Right 07/14/2020   Procedure: INSERTION PORT-A-CATH WITH ULTRASOUND GUIDANCE;  Surgeon: Donnie Mesa, MD;  Location: [place];  Service: General;  Laterality: Right;   RE-EXCISION OF BREAST LUMPECTOMY Right 08/03/2020   Procedure: RE-EXCISION OF RIGHT BREAST LUMPECTOMY SUPERIOR MARGINS;  Surgeon: Donnie Mesa, MD;  Location: [place];  Service: General;  Laterality: Right;  LMA    Vitals:   03/28/21 1601  Weight: 158 lb 6 oz (71.8 kg)     Subjective Assessment - 03/28/21 1601     Subjective Pt returns for 3 month L-Dex screen.    Pertinent History Patient was  diagnosed on 06/02/2020 with right grade II invasive ductal carcinoma breast cancer. She underwent a right lumpectomy and sentinel node biopsy (1/1 with micromets) on 07/14/2020.  It is ER/PR positive and HER2 negative with a Ki67 of 5%.                    L-DEX FLOWSHEETS - 03/28/21 1600       L-DEX LYMPHEDEMA SCREENING   Measurement Type Unilateral    L-DEX MEASUREMENT EXTREMITY Upper Extremity    POSITION  Standing    DOMINANT SIDE Right    At Risk Side Right    BASELINE SCORE (UNILATERAL) -2.7    L-DEX SCORE (UNILATERAL) -3.9    VALUE CHANGE (UNILAT) -1.2                                    PT Long Term Goals - 08/09/20 1212       PT LONG TERM GOAL #1   Title Patient will demonstrate she has regained full shoulder ROM and function post operatively compared to baselines.    Time 8    Period Weeks    Status Achieved                   Plan - 03/28/21 1605     Clinical Impression Statement Pt returns for her 3 month L-Dex  screen. her change from bsaeline of -1.2 is WNLs so no further treatment is required at this time except to cont every 3 month L-Dex screens which pt is agreeable to.    PT Next Visit Plan Cont every 3 month L-Dex screens for up to 2 years from her SLNB.    Consulted and Agree with Plan of Care Patient             Patient will benefit from skilled therapeutic intervention in order to improve the following deficits and impairments:     Visit Diagnosis: Aftercare following surgery for neoplasm     Problem List Patient Active Problem List   Diagnosis Date Noted   Port-A-Cath in place 10/15/2020   Genetic testing 06/23/2020   Family history of uterine cancer 06/16/2020   Malignant neoplasm of upper-outer quadrant of right breast in female, estrogen receptor positive ([place]) 06/11/2020    Otelia Limes, PTA 03/28/2021, 4:07 PM  [place], [place], [address] Phone: [phone]   Fax:  [phone]  Name: Autumn Bullock MRN: [id number] Date of Birth: 06/13/1976

## 2021-04-13 ENCOUNTER — Ambulatory Visit: Payer: Self-pay | Admitting: Radiation Oncology

## 2021-04-19 ENCOUNTER — Telehealth: Payer: Self-pay

## 2021-04-19 ENCOUNTER — Encounter: Payer: Self-pay | Admitting: Oncology

## 2021-04-19 NOTE — Progress Notes (Signed)
                                                                                                                                                             Patient Name: Autumn Bullock MRN: [id number] DOB: September 06, 1975 Referring Physician: Brock Ra (Profile Not Attached) Date of Service: 02/28/2021 [facility], [place]                                                        End Of Treatment Note  Diagnoses: C50.411-Malignant neoplasm of upper-outer quadrant of right female breast  Cancer Staging: Cancer Staging Malignant neoplasm of upper-outer quadrant of right breast in female, estrogen receptor positive ([place]) Staging form: Breast, AJCC 8th Edition - Clinical stage from 06/16/2020: Stage IB (cT2, cN0, cM0, G2, ER+, PR+, HER2-) - Unsigned Stage prefix: Initial diagnosis Histologic grading system: 3 grade system Laterality: Right Staged by: Pathologist and managing physician Stage used in treatment planning: Yes National guidelines used in treatment planning: Yes Type of national guideline used in treatment planning: NCCN pT2, pN45mi   Intent: Curative  Radiation Treatment Dates: 01/17/2021 through 02/28/2021 Site Technique Total Dose (Gy) Dose per Fx (Gy) Completed Fx Beam Energies  Breast, Right: Breast_Rt 3D 50/50 2 25/25 6X, 10X  Breast, Right: Breast_Rt_PAB_SCV 3D 50/50 2 25/25 6X, 10X  Breast, Right: Breast_Rt_Bst 3D 10/10 2 5/5 6X, 10X   Narrative: The patient tolerated radiation therapy relatively well.   Plan: The patient will follow-up with radiation oncology in 20mo or PRN. -----------------------------------  Eppie Gibson, MD

## 2021-04-19 NOTE — Telephone Encounter (Signed)
I called the patient today about her upcoming follow-up appointment in radiation oncology.   Given the state of the COVID-19 pandemic, concerning case numbers in our community, and guidance from [facility], I offered a phone assessment with the patient to determine if coming to the clinic was necessary. She accepted.  I let the patient know that I had spoken with Dr. Isidore Moos, and she wanted them to know the importance of washing their hands for at least 20 seconds at a time, especially after going out in public, and before they eat.  Limit going out in public whenever possible. Do not touch your face, unless your hands are clean, such as when bathing. Get plenty of rest, eat well, and stay hydrated. Patient verbalized understanding and agreement  The patient denies any symptomatic concerns. She reports her energy level has almost completely returned. She has been able to return to the gym regularly as well as resume physical therapy for her previous knee issues. She denies any lingering pain or swelling to her chest. She denies any nausea or changes in appetite.  She reports she's still having difficulty sleeping through the night because of hot flashes related to her tamoxifen, but she denies fatigue during the day. Specifically, she reports good healing of her skin in the radiation fields.  Skin is intact and starting to return to her baseline color/texture. I recommended that she continue skin care by applying oil or lotion with vitamin E to the skin in the radiation fields, BID, for 2 more months.  Patient verbalized understanding and agreement  Continue follow-up with medical oncology - follow-up is scheduled on 05/26/2021 with Dr. Lurline Del.  I explained that yearly mammograms are important for patients with intact breast tissue, and physical exams are important after mastectomy for patients that cannot undergo mammography.  I encouraged her to call if she had further questions or  concerns about her healing. Otherwise, she will follow-up PRN in radiation oncology. Patient is pleased with this plan, and we will cancel her upcoming follow-up to reduce the risk of COVID-19 transmission.

## 2021-04-20 ENCOUNTER — Ambulatory Visit: Payer: BC Managed Care – PPO | Admitting: Radiation Oncology

## 2021-04-29 ENCOUNTER — Encounter: Payer: Self-pay | Admitting: Oncology

## 2021-05-26 ENCOUNTER — Inpatient Hospital Stay (HOSPITAL_BASED_OUTPATIENT_CLINIC_OR_DEPARTMENT_OTHER): Payer: BC Managed Care – PPO | Admitting: Oncology

## 2021-05-26 ENCOUNTER — Inpatient Hospital Stay: Payer: BC Managed Care – PPO | Attending: Oncology

## 2021-05-26 ENCOUNTER — Other Ambulatory Visit: Payer: Self-pay

## 2021-05-26 VITALS — BP 112/81 | HR 63 | Temp 97.3°F | Resp 18 | Wt 154.3 lb

## 2021-05-26 DIAGNOSIS — Z79810 Long term (current) use of selective estrogen receptor modulators (SERMs): Secondary | ICD-10-CM | POA: Insufficient documentation

## 2021-05-26 DIAGNOSIS — C50411 Malignant neoplasm of upper-outer quadrant of right female breast: Secondary | ICD-10-CM

## 2021-05-26 DIAGNOSIS — Z79899 Other long term (current) drug therapy: Secondary | ICD-10-CM | POA: Insufficient documentation

## 2021-05-26 DIAGNOSIS — Z170 Estrogen receptor positive status [ER+]: Secondary | ICD-10-CM | POA: Insufficient documentation

## 2021-05-26 DIAGNOSIS — R232 Flushing: Secondary | ICD-10-CM | POA: Insufficient documentation

## 2021-05-26 DIAGNOSIS — Z923 Personal history of irradiation: Secondary | ICD-10-CM | POA: Diagnosis not present

## 2021-05-26 LAB — CBC WITH DIFFERENTIAL/PLATELET
Abs Immature Granulocytes: 0.01 10*3/uL (ref 0.00–0.07)
Basophils Absolute: 0 10*3/uL (ref 0.0–0.1)
Basophils Relative: 1 %
Eosinophils Absolute: 0.1 10*3/uL (ref 0.0–0.5)
Eosinophils Relative: 2 %
HCT: 35 % — ABNORMAL LOW (ref 36.0–46.0)
Hemoglobin: 12.1 g/dL (ref 12.0–15.0)
Immature Granulocytes: 0 %
Lymphocytes Relative: 24 %
Lymphs Abs: 0.9 10*3/uL (ref 0.7–4.0)
MCH: 33.5 pg (ref 26.0–34.0)
MCHC: 34.6 g/dL (ref 30.0–36.0)
MCV: 97 fL (ref 80.0–100.0)
Monocytes Absolute: 0.4 10*3/uL (ref 0.1–1.0)
Monocytes Relative: 12 %
Neutro Abs: 2.3 10*3/uL (ref 1.7–7.7)
Neutrophils Relative %: 61 %
Platelets: 238 10*3/uL (ref 150–400)
RBC: 3.61 MIL/uL — ABNORMAL LOW (ref 3.87–5.11)
RDW: 12.1 % (ref 11.5–15.5)
WBC: 3.7 10*3/uL — ABNORMAL LOW (ref 4.0–10.5)
nRBC: 0 % (ref 0.0–0.2)

## 2021-05-26 LAB — COMPREHENSIVE METABOLIC PANEL
ALT: 17 U/L (ref 0–44)
AST: 21 U/L (ref 15–41)
Albumin: 4.2 g/dL (ref 3.5–5.0)
Alkaline Phosphatase: 59 U/L (ref 38–126)
Anion gap: 9 (ref 5–15)
BUN: 15 mg/dL (ref 6–20)
CO2: 26 mmol/L (ref 22–32)
Calcium: 9 mg/dL (ref 8.9–10.3)
Chloride: 103 mmol/L (ref 98–111)
Creatinine, Ser: 1.02 mg/dL — ABNORMAL HIGH (ref 0.44–1.00)
GFR, Estimated: >60 mL/min (ref >60)
Glucose, Bld: 105 mg/dL — ABNORMAL HIGH (ref 70–99)
Potassium: 3.7 mmol/L (ref 3.5–5.1)
Sodium: 138 mmol/L (ref 135–145)
Total Bilirubin: 0.4 mg/dL (ref 0.3–1.2)
Total Protein: 7.2 g/dL (ref 6.5–8.1)

## 2021-05-26 NOTE — Progress Notes (Signed)
[place]  Telephone:(336) [phone] [phone]) [phone]     ID: Autumn Bullock DOB: [birth year]  MR#: [id number]  UGQ#:[id number]  Patient Care Team: Maximino Greenland as PCP - Cira Rue, RN as Oncology Nurse Navigator Rockwell Germany, RN as Oncology Nurse Navigator Donnie Mesa, MD as Consulting Physician (General Surgery) Eppie Gibson, MD as Attending Physician (Radiation Oncology) Autumn Bullock, Autumn Dad, MD as Consulting Physician (Oncology) Vanessa Kick, MD as Consulting Physician (Obstetrics and Gynecology) Lovett Calender, MD as Consulting Physician (Orthopedic Surgery) Chauncey Cruel, MD OTHER MD:  CHIEF COMPLAINT: Estrogen receptor positive breast cancer  CURRENT TREATMENT: Tamoxifen   INTERVAL HISTORY: Autumn Bullock returns today for follow up of her estrogen receptor positive breast cancer.  She is accompanied by her husband Autumn Bullock  She started tamoxifen on 03/17/2021.  She is having some hot flashes as the main side effect.  These were waking her up at night but they are now a little bit less intense.  She does not find that gabapentin makes more of a difference and she is planning to come off that medication  She inadvertently went off the Effexor without tapering and it was a perfectly horrible experience.  She eventually called [place] and we gave her a slower taper and she is finally got off that medication about 2 weeks ago.  "I never want to be on that drug again".  REVIEW OF SYSTEMS: Autumn Bullock is back to work full-time and really enjoys it.  In addition she goes to boot camp about 4 days a week.  She has not had a period since starting chemotherapy.  A detailed review of systems today was otherwise stable   COVID 19 VACCINATION STATUS: Status post [employer] x2,.  Patient also was diagnosed with COVID-19 disease November 2020.  She subsequently received 1 booster   HISTORY OF CURRENT ILLNESS: From the original intake note:  Autumn Bullock had routine  screening mammography on 06/02/2020 showing a possible abnormality in the right breast. She underwent right diagnostic mammography with tomography and right breast ultrasonography at [facility] on 06/08/2020 showing: breast density category C; 2.3 cm right breast mass at 11:30; 0.8 cm mass superficial to dominant mass; no right axillary adenopathy.  Accordingly on 06/08/2020 she proceeded to biopsy of the right breast area in question. The pathology from this procedure ([id number]) showed: invasive and in situ mammary carcinoma, grade 2, e-cadherin positive. Both biopsied masses showed this and were found to be morphologically similar. Prognostic indicators significant for: estrogen receptor, 90% positive and progesterone receptor, 70% positive, both with strong staining intensity. Proliferation marker Ki67 at 5%. HER2 negative by immunohistochemistry (1+).  The patient's subsequent history is as detailed below.   PAST MEDICAL HISTORY: Past Medical History:  Diagnosis Date   Cancer [facility])    breast   Family history of uterine cancer 06/16/2020   PONV (postoperative nausea and vomiting)     PAST SURGICAL HISTORY: Past Surgical History:  Procedure Laterality Date   BREAST LUMPECTOMY WITH RADIOACTIVE SEED AND SENTINEL LYMPH NODE BIOPSY Right 07/14/2020   Procedure: RIGHT BREAST LUMPECTOMY WITH BRACKETED RADIOACTIVE SEED AND SENTINEL LYMPH NODE BIOPSY, BLUE DYE INJECTION;  Surgeon: Donnie Mesa, MD;  Location: [place];  Service: General;  Laterality: Right;  PEC BLOCK, BLUE DYE INJECTION   CESAREAN SECTION     2011   PORT-A-CATH REMOVAL N/A 03/17/2021   Procedure: REMOVAL PORT-A-CATH;  Surgeon: Donnie Mesa, MD;  Location: [place];  Service:  General;  Laterality: N/A;   PORTACATH PLACEMENT Right 07/14/2020   Procedure: INSERTION PORT-A-CATH WITH ULTRASOUND GUIDANCE;  Surgeon: Donnie Mesa, MD;  Location: [place];  Service: General;   Laterality: Right;   RE-EXCISION OF BREAST LUMPECTOMY Right 08/03/2020   Procedure: RE-EXCISION OF RIGHT BREAST LUMPECTOMY SUPERIOR MARGINS;  Surgeon: Donnie Mesa, MD;  Location: [place];  Service: General;  Laterality: Right;  LMA    FAMILY HISTORY: Family History  Problem Relation Age of Onset   Cancer Maternal Grandmother 66       Unknown GYN.  ?Uterine  As of October 2021 her parents are both living, her father at age 96 and her mother at 56, . Nahima has 1 brother and 2 sisters. She reports uterine cancer in her maternal grandmother at age 49. There is no family history of breast, ovarian, or colon cancer to her knowledge.   GYNECOLOGIC HISTORY:  No LMP recorded.--Last menstrual period December 2021 Menarche: 45 years old Age at first live birth: 45 years old [place] P 1 LMP 05/05/2020, regular monthly periods, lasting 3 days with 1-2 heavy days Contraceptive: has used for 15 years, no complications HRT n/a  Hysterectomy? no BSO? 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 Husband Autumn Bullock works in Nature conservation officer. She lives at home with Autumn Bullock and their daughter Autumn Bullock, age 61. She attends [employer] in [place].    ADVANCED DIRECTIVES: In the absence of any documentation to the contrary, the patient's spouse is their HCPOA.    HEALTH MAINTENANCE: Social History   Tobacco Use   Smoking status: Never   Smokeless tobacco: Never  Vaping Use   Vaping Use: Never used  Substance Use Topics   Alcohol use: Yes    Comment: 7/week   Drug use: Never    Comment: Delta 8 gummies     Colonoscopy: 1999?  PAP: 05/31/2020  Bone density: never done   Allergies  Allergen Reactions   Latex    Wound Dressing Adhesive     Tape etc     Current Outpatient Medications  Medication Sig Dispense Refill   gabapentin (NEURONTIN) 300 MG capsule Take 1 capsule (300 mg total) by mouth at bedtime. 90  capsule 4   venlafaxine XR (EFFEXOR-XR) 37.5 MG 24 hr capsule Take 1 capsule (37.5 mg total) by mouth daily with breakfast. 90 capsule 1   No current facility-administered medications for this visit.    OBJECTIVE: White woman who appears well Vitals:   05/26/21 1515  BP: 112/81  Pulse: 63  Resp: 18  Temp: (!) 97.3 F (36.3 C)  SpO2: 100%     Body mass index is 25.68 kg/m.   Wt Readings from Last 3 Encounters:  05/26/21 154 lb 4.8 oz (70 kg)  03/28/21 158 lb 6 oz (71.8 kg)  03/10/21 158 lb 11.7 oz (72 kg)     ECOG FS:1 - Symptomatic but completely ambulatory  Her hair has come and black with a few white strands, quite curly and thick Sclerae unicteric, EOMs intact Wearing a mask No cervical or supraclavicular adenopathy Lungs no rales or rhonchi Heart regular rate and rhythm Abd soft, nontender, positive bowel sounds MSK no focal spinal tenderness, no upper extremity lymphedema Neuro: nonfocal, well oriented, appropriate affect Breasts: The right breast has undergone lumpectomy followed by radiation.  It is a bit firmer and slightly darker than the left but otherwise unremarkable.  Both axillae  are benign   LAB RESULTS:  CMP     Component Value Date/Time   NA 139 03/03/2021 0846   K 4.0 03/03/2021 0846   CL 106 03/03/2021 0846   CO2 22 03/03/2021 0846   GLUCOSE 109 (H) 03/03/2021 0846   BUN 13 03/03/2021 0846   CREATININE 0.71 03/03/2021 0846   CREATININE 0.73 08/17/2020 0905   CALCIUM 8.9 03/03/2021 0846   PROT 7.1 03/03/2021 0846   ALBUMIN 4.0 03/03/2021 0846   AST 28 03/03/2021 0846   AST 18 08/17/2020 0905   ALT 22 03/03/2021 0846   ALT 18 08/17/2020 0905   ALKPHOS 73 03/03/2021 0846   BILITOT 0.4 03/03/2021 0846   BILITOT 0.7 08/17/2020 0905   GFRNONAA >60 03/03/2021 0846   GFRNONAA >60 08/17/2020 0905    No results found for: TOTALPROTELP, ALBUMINELP, A1GS, A2GS, BETS, BETA2SER, GAMS, MSPIKE, SPEI  Lab Results  Component Value Date   WBC 3.7 (L)  05/26/2021   NEUTROABS 2.3 05/26/2021   HGB 12.1 05/26/2021   HCT 35.0 (L) 05/26/2021   MCV 97.0 05/26/2021   PLT 238 05/26/2021    No results found for: LABCA2  No components found for: DHRCBU384  No results for input(s): INR in the last 168 hours.  No results found for: LABCA2  No results found for: TXM468  No results found for: EHO122  No results found for: QMG500  No results found for: CA2729  No components found for: HGQUANT  No results found for: CEA1 / No results found for: CEA1   No results found for: AFPTUMOR  No results found for: CHROMOGRNA  No results found for: KPAFRELGTCHN, LAMBDASER, KAPLAMBRATIO (kappa/lambda light chains)  No results found for: HGBA, HGBA2QUANT, HGBFQUANT, HGBSQUAN (Hemoglobinopathy evaluation)   No results found for: LDH  No results found for: IRON, TIBC, IRONPCTSAT (Iron and TIBC)  No results found for: FERRITIN  Urinalysis    Component Value Date/Time   COLORURINE YELLOW 09/02/2020 0843   APPEARANCEUR HAZY (A) 09/02/2020 0843   LABSPEC 1.010 09/02/2020 0843   PHURINE 6.0 09/02/2020 0843   GLUCOSEU NEGATIVE 09/02/2020 0843   HGBUR SMALL (A) 09/02/2020 0843   BILIRUBINUR NEGATIVE 09/02/2020 0843   KETONESUR NEGATIVE 09/02/2020 0843   PROTEINUR NEGATIVE 09/02/2020 0843   NITRITE NEGATIVE 09/02/2020 0843   LEUKOCYTESUR TRACE (A) 09/02/2020 0843    STUDIES: No results found.   ELIGIBLE FOR AVAILABLE RESEARCH PROTOCOL: AET  ASSESSMENT: 45 y.o. [place] woman status post right breast upper outer quadrant biopsy 06/08/2020 for a clinical mT2 N0, stage IB invasive ductal carcinoma, grade 2, estrogen and progesterone receptor positive, HER-2 not amplified, with an MIB-1 of 5%.  (1) genetics testing 06/22/2020 through the Common Hereditary Cancers Panel offered by [facility] found no deleterious mutations in APC, ATM, AXIN2, BARD1, BMPR1A, BRCA1, BRCA2, BRIP1, CDH1, CDK4, CDKN2A (p14ARF), CDKN2A (p16INK4a), CHEK2, CTNNA1,  DICER1, EPCAM (Deletion/duplication testing only), GREM1 (promoter region deletion/duplication testing only), KIT, MEN1, MLH1, MSH2, MSH3, MSH6, MUTYH, NBN, NF1, NHTL1, PALB2, PDGFRA, PMS2, POLD1, POLE, PTEN, RAD50, RAD51C, RAD51D, RNF43, SDHB, SDHC, SDHD, SMAD4, SMARCA4. STK11, TP53, TSC1, TSC2, and VHL.  The following genes were evaluated for sequence changes only: SDHA and HOXB13 c.251G>A variant only.  (a)  a variant of uncertain significance in MSH3 at c.2731T>G (p.Leu911Val).   (2) right lumpectomy and sentinel lymph node sampling 07/14/2020 showed a pT2 pN1(mic), stage IIA invasive ductal carcinoma, grade 2, with a positive superior margin  (a) additional surgery 08/03/2020  (3) MammaPrint obtained  from the initial biopsy showed high risk, predicting a 93% 5-year disease-free survival with chemotherapy and significant benefit from chemotherapy (greater than 12%).  (4) cyclophosphamide and doxorubicin in dose dense fashion x4 started 10/15/2020 08/17/2020, completed 09/28/2020, followed by paclitaxel weekly x12 started 10/15/2020, completed 12/30/2020  (a) echo 07/09/2020 shows an ejection fraction in the 60-65% range.  (5) adjuvant radiation 01/17/2021 - 02/28/2021  (6) tamoxifen to start 03/17/2021   PLAN: Tamirah is tolerating tamoxifen generally well and the plan will be to continue that a minimum of 5 years.  She understands that tamoxifen is not a contraceptive.  We generally discourage people from getting pregnant while on tamoxifen.  On the other hand she is almost a year out from her last menstrual period and very likely is going to be permanently menopausal  She is going to have mammography in November which is the anniversary of her surgery.  She will have lab work in early December and return to see me December 19.  With her December lab work we will do an [facility] and estradiol.  Total encounter time 25 minutes.   Autumn Bullock. Clemon Devaul, MD 05/26/21 3:33 PM Medical Oncology and  Hematology [facility] [place], [place] [address] Tel. [phone]    Fax. [phone]   I, Wilburn Mylar, am acting as scribe for Dr. Virgie Bullock. Demonica Farrey.  I, Lurline Del MD, have reviewed the above documentation for accuracy and completeness, and I agree with the above.   *Total Encounter Time as defined by the Centers for Medicare and Medicaid Services includes, in addition to the face-to-face time of a patient visit (documented in the note above) non-face-to-face time: obtaining and reviewing outside history, ordering and reviewing medications, tests or procedures, care coordination (communications with other health care professionals or caregivers) and documentation in the medical record.

## 2021-06-08 ENCOUNTER — Encounter: Payer: Self-pay | Admitting: Oncology

## 2021-06-12 NOTE — Progress Notes (Signed)
[place]  [phone] Fax:(336) [phone]     ID: Autumn Bullock DOB: 19-Oct-1975  MR#: [id number]  KPT#:[id number]  Patient Care Team: Maximino Greenland as PCP - Cira Rue, RN as Oncology Nurse Navigator Rockwell Germany, RN as Oncology Nurse Navigator Donnie Mesa, MD as Consulting Physician (General Surgery) Eppie Gibson, MD as Attending Physician (Radiation Oncology) Khrystyne Arpin, Virgie Dad, MD as Consulting Physician (Oncology) Vanessa Kick, MD as Consulting Physician (Obstetrics and Gynecology) Lovett Calender, MD as Consulting Physician (Orthopedic Surgery) Chauncey Cruel, MD OTHER MD:  CHIEF COMPLAINT: Estrogen receptor positive breast cancer  CURRENT TREATMENT: Tamoxifen   INTERVAL HISTORY: [name] returns today for follow up of her estrogen receptor positive breast cancer.  She is accompanied by her husband Ronalee Belts  She had her port removed 03/17/2021.  She started tamoxifen on 03/17/2021.  She did fine on that for about 3 months while taking venlafaxine and gabapentin.  She went off both drugs early October. She contacted [place] on 06/08/2021 via MyChart with concerns regarding : Mood swings, depression, sleep deprivation, and anxiety.  She tells me she does not want to go back on Effexor when she forgot to take it 1 day she felt horrible.  She does not want to expose herself to the same problem again.  She might be willing to give gabapentin a try since it was helping with the nighttime sleeping issue   REVIEW OF SYSTEMS: Autumn Bullock is very irritable with her husband she says she cries very easily.  She just past the first anniversary of her surgery and "it was like it was all happening all over again".  She is having some vaginal dryness issues as well.  All this is beginning to affect her job as well as her marriage.  Detailed review of systems was otherwise stable   COVID 19 VACCINATION STATUS: Status post [employer] x2,.  Patient  also was diagnosed with COVID-19 disease November 2020.  She subsequently received 1 booster   HISTORY OF CURRENT ILLNESS: From the original intake note:  Brae had routine screening mammography on 06/02/2020 showing a possible abnormality in the right breast. She underwent right diagnostic mammography with tomography and right breast ultrasonography at [facility] on 06/08/2020 showing: breast density category C; 2.3 cm right breast mass at 11:30; 0.8 cm mass superficial to dominant mass; no right axillary adenopathy.  Accordingly on 06/08/2020 she proceeded to biopsy of the right breast area in question. The pathology from this procedure ([id number]) showed: invasive and in situ mammary carcinoma, grade 2, e-cadherin positive. Both biopsied masses showed this and were found to be morphologically similar. Prognostic indicators significant for: estrogen receptor, 90% positive and progesterone receptor, 70% positive, both with strong staining intensity. Proliferation marker Ki67 at 5%. HER2 negative by immunohistochemistry (1+).  The patient's subsequent history is as detailed below.   PAST MEDICAL HISTORY: Past Medical History:  Diagnosis Date   Cancer [facility])    breast   Family history of uterine cancer 06/16/2020   PONV (postoperative nausea and vomiting)     PAST SURGICAL HISTORY: Past Surgical History:  Procedure Laterality Date   BREAST LUMPECTOMY WITH RADIOACTIVE SEED AND SENTINEL LYMPH NODE BIOPSY Right 07/14/2020   Procedure: RIGHT BREAST LUMPECTOMY WITH BRACKETED RADIOACTIVE SEED AND SENTINEL LYMPH NODE BIOPSY, BLUE DYE INJECTION;  Surgeon: Donnie Mesa, MD;  Location: [place];  Service: General;  Laterality: Right;  PEC BLOCK, BLUE DYE INJECTION   CESAREAN SECTION  2011   PORT-A-CATH REMOVAL N/A 03/17/2021   Procedure: REMOVAL PORT-A-CATH;  Surgeon: Donnie Mesa, MD;  Location: [place];  Service: General;  Laterality: N/A;    PORTACATH PLACEMENT Right 07/14/2020   Procedure: INSERTION PORT-A-CATH WITH ULTRASOUND GUIDANCE;  Surgeon: Donnie Mesa, MD;  Location: [place];  Service: General;  Laterality: Right;   RE-EXCISION OF BREAST LUMPECTOMY Right 08/03/2020   Procedure: RE-EXCISION OF RIGHT BREAST LUMPECTOMY SUPERIOR MARGINS;  Surgeon: Donnie Mesa, MD;  Location: [place];  Service: General;  Laterality: Right;  LMA    FAMILY HISTORY: Family History  Problem Relation Age of Onset   Cancer Maternal Grandmother 11       Unknown GYN.  ?Uterine  As of October 2021 her parents are both living, her father at age 17 and her mother at 45, . [name] has 1 brother and 2 sisters. She reports uterine cancer in her maternal grandmother at age 28. There is no family history of breast, ovarian, or colon cancer to her knowledge.   GYNECOLOGIC HISTORY:  No LMP recorded.--Last menstrual period December 2021 Menarche: 45 years old Age at first live birth: 45 years old [place] P 1 LMP 05/05/2020, regular monthly periods, lasting 3 days with 1-2 heavy days Contraceptive: has used for 15 years, no complications HRT n/a  Hysterectomy? no BSO? 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 Husband Autumn Bullock works in Nature conservation officer. She lives at home with Autumn Bullock and their daughter [name], age 43. She attends [employer] in [place].    ADVANCED DIRECTIVES: In the absence of any documentation to the contrary, the patient's spouse is their HCPOA.    HEALTH MAINTENANCE: Social History   Tobacco Use   Smoking status: Never   Smokeless tobacco: Never  Vaping Use   Vaping Use: Never used  Substance Use Topics   Alcohol use: Yes    Comment: 7/week   Drug use: Never    Comment: Delta 8 gummies     Colonoscopy: 1999?  PAP: 05/31/2020  Bone density: never done   Allergies  Allergen Reactions   Latex    Wound Dressing  Adhesive     Tape etc     Current Outpatient Medications  Medication Sig Dispense Refill   gabapentin (NEURONTIN) 300 MG capsule Take 1 capsule (300 mg total) by mouth at bedtime. 90 capsule 4   venlafaxine XR (EFFEXOR-XR) 37.5 MG 24 hr capsule Take 1 capsule (37.5 mg total) by mouth daily with breakfast. 90 capsule 1   No current facility-administered medications for this visit.    OBJECTIVE: White woman who appears stated age 45:   06/13/21 1600  BP: 109/62  Pulse: 61  Resp: 18  Temp: 97.6 F (36.4 C)  SpO2: 100%      Body mass index is 25.63 kg/m.   Wt Readings from Last 3 Encounters:  06/13/21 154 lb (69.9 kg)  05/26/21 154 lb 4.8 oz (70 kg)  03/28/21 158 lb 6 oz (71.8 kg)     ECOG FS:1 - Symptomatic but completely ambulatory  Sclerae unicteric, EOMs intact Wearing a mask No cervical or supraclavicular adenopathy Lungs no rales or rhonchi Heart regular rate and rhythm Abd soft, nontender, positive bowel sounds MSK no focal spinal tenderness, no upper extremity lymphedema Neuro: nonfocal, well oriented, tearful and frustrated affect Breasts: Deferred   LAB RESULTS:  CMP     Component Value Date/Time  NA 138 05/26/2021 1459   K 3.7 05/26/2021 1459   CL 103 05/26/2021 1459   CO2 26 05/26/2021 1459   GLUCOSE 105 (H) 05/26/2021 1459   BUN 15 05/26/2021 1459   CREATININE 1.02 (H) 05/26/2021 1459   CREATININE 0.73 08/17/2020 0905   CALCIUM 9.0 05/26/2021 1459   PROT 7.2 05/26/2021 1459   ALBUMIN 4.2 05/26/2021 1459   AST 21 05/26/2021 1459   AST 18 08/17/2020 0905   ALT 17 05/26/2021 1459   ALT 18 08/17/2020 0905   ALKPHOS 59 05/26/2021 1459   BILITOT 0.4 05/26/2021 1459   BILITOT 0.7 08/17/2020 0905   GFRNONAA >60 05/26/2021 1459   GFRNONAA >60 08/17/2020 0905    No results found for: TOTALPROTELP, ALBUMINELP, A1GS, A2GS, BETS, BETA2SER, GAMS, MSPIKE, SPEI  Lab Results  Component Value Date   WBC 3.7 (L) 05/26/2021   NEUTROABS 2.3  05/26/2021   HGB 12.1 05/26/2021   HCT 35.0 (L) 05/26/2021   MCV 97.0 05/26/2021   PLT 238 05/26/2021    No results found for: LABCA2  No components found for: MAUQJF354  No results for input(s): INR in the last 168 hours.  No results found for: LABCA2  No results found for: TGY563  No results found for: SLH734  No results found for: KAJ681  No results found for: CA2729  No components found for: HGQUANT  No results found for: CEA1 / No results found for: CEA1   No results found for: AFPTUMOR  No results found for: CHROMOGRNA  No results found for: KPAFRELGTCHN, LAMBDASER, KAPLAMBRATIO (kappa/lambda light chains)  No results found for: HGBA, HGBA2QUANT, HGBFQUANT, HGBSQUAN (Hemoglobinopathy evaluation)   No results found for: LDH  No results found for: IRON, TIBC, IRONPCTSAT (Iron and TIBC)  No results found for: FERRITIN  Urinalysis    Component Value Date/Time   COLORURINE YELLOW 09/02/2020 0843   APPEARANCEUR HAZY (A) 09/02/2020 0843   LABSPEC 1.010 09/02/2020 0843   PHURINE 6.0 09/02/2020 0843   GLUCOSEU NEGATIVE 09/02/2020 0843   HGBUR SMALL (A) 09/02/2020 0843   BILIRUBINUR NEGATIVE 09/02/2020 0843   KETONESUR NEGATIVE 09/02/2020 0843   PROTEINUR NEGATIVE 09/02/2020 0843   NITRITE NEGATIVE 09/02/2020 0843   LEUKOCYTESUR TRACE (A) 09/02/2020 0843    STUDIES: No results found.   ELIGIBLE FOR AVAILABLE RESEARCH PROTOCOL: AET  ASSESSMENT: 45 y.o. [place] woman status post right breast upper outer quadrant biopsy 06/08/2020 for a clinical mT2 N0, stage IB invasive ductal carcinoma, grade 2, estrogen and progesterone receptor positive, HER-2 not amplified, with an MIB-1 of 5%.  (1) genetics testing 06/22/2020 through the Common Hereditary Cancers Panel offered by [facility] found no deleterious mutations in APC, ATM, AXIN2, BARD1, BMPR1A, BRCA1, BRCA2, BRIP1, CDH1, CDK4, CDKN2A (p14ARF), CDKN2A (p16INK4a), CHEK2, CTNNA1, DICER1, EPCAM  (Deletion/duplication testing only), GREM1 (promoter region deletion/duplication testing only), KIT, MEN1, MLH1, MSH2, MSH3, MSH6, MUTYH, NBN, NF1, NHTL1, PALB2, PDGFRA, PMS2, POLD1, POLE, PTEN, RAD50, RAD51C, RAD51D, RNF43, SDHB, SDHC, SDHD, SMAD4, SMARCA4. STK11, TP53, TSC1, TSC2, and VHL.  The following genes were evaluated for sequence changes only: SDHA and HOXB13 c.251G>A variant only.  (a)  a variant of uncertain significance in MSH3 at c.2731T>G (p.Leu911Val).   (2) right lumpectomy and sentinel lymph node sampling 07/14/2020 showed a pT2 pN1(mic), stage IIA invasive ductal carcinoma, grade 2, with a positive superior margin  (a) additional surgery 08/03/2020  (3) MammaPrint obtained from the initial biopsy showed high risk, predicting a 93% 5-year disease-free survival with chemotherapy and  significant benefit from chemotherapy (greater than 12%).  (4) cyclophosphamide and doxorubicin in dose dense fashion x4 started 10/15/2020 08/17/2020, completed 09/28/2020, followed by paclitaxel weekly x12 started 10/15/2020, completed 12/30/2020  (a) echo 07/09/2020 shows an ejection fraction in the 60-65% range.  (5) adjuvant radiation 01/17/2021 - 02/28/2021  (6) tamoxifen to start 03/17/2021   PLAN: Zyaire did fine with tamoxifen until she went off the venlafaxine.  She went off the venlafaxine because she had not taking it briefly and that made her feel horrible which is indeed the common experience.  She had difficulty staying on venlafaxine because she was told she had to take it with food and sometimes she would leave in the morning without having breakfast and then she would forget to take it in the evening and so on.  She does not want to go back on that medication  I think we could be having 1 of 3 possibilities.  The first is all the symptoms are really due to tamoxifen.  If so then if she goes off tamoxifen now and we wait 4 weeks all the symptoms should be significantly improved.  We are  doing that: She will not take tamoxifen beginning tomorrow and we will have a virtual visit July 07, 2021 to assess whether the symptoms have pretty much resolved.  A second possibility which I think is certainly part of a problem at least this posttraumatic stress.  The fact that she just had her first anniversary from her surgery placed into it and as she describes it "brought everything back".  The treatment for posttraumatic stress is Effexor or similar drugs so if the symptoms do not significantly improve on tamoxifen we will have to consider a different antidepressant regardless of whether she goes back on tamoxifen or not  Finally she simply may be having menopause.  Certainly mood alteration and irritability and insomnia are very common menopausal symptoms.  I think it would be reasonable for her to go back on gabapentin now so she can sleep a little better.  I gave her a copy of the summary comparison between tamoxifen and anastrozole in preparation for discussion July 07, 2021 in case we decide to switch to anastrozole at that time  She was interested in counseling.  I will alert our chaplain.  Total encounter time 35 minutes.*  Total encounter time 25 minutes.   Virgie Dad. Magrinat, MD 06/13/21 5:10 PM Medical Oncology and Hematology [facility] [place], [place] [address] Tel. [phone]    Fax. [phone]   I, Wilburn Mylar, am acting as scribe for Dr. Virgie Dad. Magrinat.  I, Lurline Del MD, have reviewed the above documentation for accuracy and completeness, and I agree with the above.   *Total Encounter Time as defined by the Centers for Medicare and Medicaid Services includes, in addition to the face-to-face time of a patient visit (documented in the note above) non-face-to-face time: obtaining and reviewing outside history, ordering and reviewing medications, tests or procedures, care coordination (communications with other  health care professionals or caregivers) and documentation in the medical record.

## 2021-06-13 ENCOUNTER — Other Ambulatory Visit: Payer: Self-pay

## 2021-06-13 ENCOUNTER — Inpatient Hospital Stay: Payer: BC Managed Care – PPO | Attending: Oncology | Admitting: Oncology

## 2021-06-13 VITALS — BP 109/62 | HR 61 | Temp 97.6°F | Resp 18 | Wt 154.0 lb

## 2021-06-13 DIAGNOSIS — Z79810 Long term (current) use of selective estrogen receptor modulators (SERMs): Secondary | ICD-10-CM | POA: Insufficient documentation

## 2021-06-13 DIAGNOSIS — C50411 Malignant neoplasm of upper-outer quadrant of right female breast: Secondary | ICD-10-CM | POA: Insufficient documentation

## 2021-06-13 DIAGNOSIS — Z170 Estrogen receptor positive status [ER+]: Secondary | ICD-10-CM | POA: Diagnosis not present

## 2021-06-14 ENCOUNTER — Other Ambulatory Visit: Payer: Self-pay | Admitting: *Deleted

## 2021-06-14 DIAGNOSIS — C50411 Malignant neoplasm of upper-outer quadrant of right female breast: Secondary | ICD-10-CM

## 2021-06-14 DIAGNOSIS — Z170 Estrogen receptor positive status [ER+]: Secondary | ICD-10-CM

## 2021-06-15 ENCOUNTER — Encounter: Payer: Self-pay | Admitting: General Practice

## 2021-06-15 NOTE — Progress Notes (Signed)
[facility] Spiritual Care Note  Referred by Dr Jana Hakim for emotional support and possible counseling referral. Left introductory voicemail of care and support, encouraging return call.   [place], [place], [facility] Pager [phone] Voicemail [phone]

## 2021-06-16 ENCOUNTER — Encounter: Payer: Self-pay | Admitting: General Practice

## 2021-06-16 NOTE — Progress Notes (Signed)
[place] Spiritual Care Note  Received return call from [facility] and spoke at length about support resources for processing her cancer-treatment experience and moving into survivorship. We have signed her up for Breast Cancer Support Group, the Finding Your New Normal [facility]) program winter interest list, and plan to investigate whether there might be an Manufacturing engineer for her at this stage as well. Recommended a counselor in the community who interned with [place] at [facility] and also set up a [place] appointment for Friday 10/21 at 3:30 pm.   Suzette Battiest, [place], [facility] Pager [phone] Voicemail [phone]

## 2021-06-19 ENCOUNTER — Encounter: Payer: Self-pay | Admitting: Oncology

## 2021-06-20 ENCOUNTER — Telehealth: Payer: Self-pay | Admitting: *Deleted

## 2021-06-20 NOTE — Telephone Encounter (Signed)
This RN spoke with the patient- per her My Chart message stating concern for new " leakage from nipple of surgical side "  She states leakage is clear in color- with minimal amount  " not enough to wet through my clothes"  She denies any increased tenderness -and states no swelling or increased warmth or redness.  Of note pt has recently stopped the tamoxifen.  This RN discussed above may be transitory post completing chemo and currently off the tamoxifen as her body is having hormonal changes- leakage may be related to that and prior surgery interfering with breast ducts.  This RN asked that she monitor the above- and let us know if symptoms asked occur- informed her concern is more related to possible cellulitis.  Pt stated appreciation of above discussion and reassurance.  She also verbalized understanding to call if symptoms continue or change.

## 2021-06-24 ENCOUNTER — Encounter: Payer: Self-pay | Admitting: General Practice

## 2021-06-24 NOTE — Progress Notes (Signed)
[place] Spiritual Care Note  Met with Illiana for an hour in my office as planned. She is doing deep work with processing and integrating the very common feelings that patients encounter on the other side of treatment. Provided pastoral presence, reflective listening, normalization of feelings, and emotional support. We connected well and set up a follow-up appointment for next Thursday while she is still trying to make arrangements with a community-based counselor for long-term support.   [place], [place], [facility] Pager [phone] Voicemail [phone]

## 2021-06-27 ENCOUNTER — Ambulatory Visit: Payer: BC Managed Care – PPO

## 2021-06-30 ENCOUNTER — Encounter: Payer: Self-pay | Admitting: General Practice

## 2021-06-30 NOTE — Progress Notes (Signed)
[facility] Spiritual Care Note  Followed up with Autumn Bullock as planned in my office. She is doing a lot of reflection and processing related to her diagnosis, treatment, and adjustment to life after treatment.  She notes that this week she has felt much better than last week, citing a number of details to celebrate and be grateful for. The natural buoyancy of her personality was shining through today.  One theme we discussed in detail was, "How do you want to be remembered?" which is a theme her church is exploring right now. This helped her identify aspects of herself that she really values and that others tend to reflect back to her, including loving people right where they are.  We plan to follow up in my office next week, and she is also registered for the [facility] healing garden mini retreat on Friday 11/11.   [place], [place], [facility] Pager [phone] Voicemail [phone]

## 2021-07-04 ENCOUNTER — Ambulatory Visit: Payer: BC Managed Care – PPO | Admitting: Physical Therapy

## 2021-07-07 ENCOUNTER — Encounter: Payer: Self-pay | Admitting: General Practice

## 2021-07-07 ENCOUNTER — Other Ambulatory Visit: Payer: Self-pay | Admitting: Oncology

## 2021-07-07 ENCOUNTER — Inpatient Hospital Stay: Payer: BC Managed Care – PPO | Attending: Oncology | Admitting: Oncology

## 2021-07-07 ENCOUNTER — Other Ambulatory Visit: Payer: Self-pay

## 2021-07-07 VITALS — BP 104/54 | HR 60 | Temp 97.8°F | Resp 18 | Ht 65.0 in | Wt 151.1 lb

## 2021-07-07 DIAGNOSIS — Z170 Estrogen receptor positive status [ER+]: Secondary | ICD-10-CM | POA: Diagnosis not present

## 2021-07-07 DIAGNOSIS — C50411 Malignant neoplasm of upper-outer quadrant of right female breast: Secondary | ICD-10-CM | POA: Diagnosis not present

## 2021-07-07 DIAGNOSIS — Z79810 Long term (current) use of selective estrogen receptor modulators (SERMs): Secondary | ICD-10-CM | POA: Insufficient documentation

## 2021-07-07 MED ORDER — PAROXETINE HCL 10 MG PO TABS: 10.0000 mg | ORAL_TABLET | Freq: Every day | ORAL | 4 refills | Status: DC

## 2021-07-07 NOTE — Progress Notes (Addendum)
[place]  [phone] [phone]     ID: AYLYN WENZLER DOB: Jan 14, 1976  MR#: [id number]  MBW#:[id number]  Patient Care Team: Maximino Greenland as PCP - Cira Rue, RN as Oncology Nurse Navigator Rockwell Germany, RN as Oncology Nurse Navigator Donnie Mesa, MD as Consulting Physician (General Surgery) Eppie Gibson, MD as Attending Physician (Radiation Oncology) Magrinat, Virgie Dad, MD as Consulting Physician (Oncology) Vanessa Kick, MD as Consulting Physician (Obstetrics and Gynecology) Lovett Calender, MD as Consulting Physician (Orthopedic Surgery) Chauncey Cruel, MD OTHER MD:   CHIEF COMPLAINT: Estrogen receptor positive breast cancer  CURRENT TREATMENT: Tamoxifen   INTERVAL HISTORY: Autumn Bullock was scheduled for a virtual visit today for follow up of her estrogen receptor positive breast cancer.  However she had an appointment with support staff and since she was here many that was changed to in person.  She is here with her mother who just arrived last night to visit.  She has held tamoxifen since her last visit on 06/13/2021 due to side effects.  Mostly she felt anxious, irritable, depressed, and moody.  Whether that had anything to do with tamoxifen or was simply due to menopause and the posttraumatic stress that my patients tend to experience at the completion of treatment, remains an open question.  In any case she is now feeling much better after doing some counseling with our chaplain and is ready to give tamoxifen a second try.  She is scheduled for diagnostic mammography 07/15/2021   REVIEW OF SYSTEMS: Autumn Bullock is doing a little running and a little exercising but not nearly as much as she was before.  She has not had a period since December 2022.  A detailed review of systems was otherwise stable.   COVID 19 VACCINATION STATUS: Status post [employer] x2,.  Patient also was diagnosed with COVID-19 disease November  2020.  She subsequently received 1 booster   HISTORY OF CURRENT ILLNESS: From the original intake note:  Autumn Bullock had routine screening mammography on 06/02/2020 showing a possible abnormality in the right breast. She underwent right diagnostic mammography with tomography and right breast ultrasonography at [facility] on 06/08/2020 showing: breast density category C; 2.3 cm right breast mass at 11:30; 0.8 cm mass superficial to dominant mass; no right axillary adenopathy.  Accordingly on 06/08/2020 she proceeded to biopsy of the right breast area in question. The pathology from this procedure ([id number]) showed: invasive and in situ mammary carcinoma, grade 2, e-cadherin positive. Both biopsied masses showed this and were found to be morphologically similar. Prognostic indicators significant for: estrogen receptor, 90% positive and progesterone receptor, 70% positive, both with strong staining intensity. Proliferation marker Ki67 at 5%. HER2 negative by immunohistochemistry (1+).  The patient's subsequent history is as detailed below.   PAST MEDICAL HISTORY: Past Medical History:  Diagnosis Date   Cancer [facility])    breast   Family history of uterine cancer 06/16/2020   PONV (postoperative nausea and vomiting)     PAST SURGICAL HISTORY: Past Surgical History:  Procedure Laterality Date   BREAST LUMPECTOMY WITH RADIOACTIVE SEED AND SENTINEL LYMPH NODE BIOPSY Right 07/14/2020   Procedure: RIGHT BREAST LUMPECTOMY WITH BRACKETED RADIOACTIVE SEED AND SENTINEL LYMPH NODE BIOPSY, BLUE DYE INJECTION;  Surgeon: Donnie Mesa, MD;  Location: [place];  Service: General;  Laterality: Right;  PEC BLOCK, BLUE DYE INJECTION   CESAREAN SECTION     2011   PORT-A-CATH REMOVAL N/A 03/17/2021   Procedure:  REMOVAL PORT-A-CATH;  Surgeon: Donnie Mesa, MD;  Location: [place];  Service: General;  Laterality: N/A;   PORTACATH PLACEMENT Right 07/14/2020   Procedure:  INSERTION PORT-A-CATH WITH ULTRASOUND GUIDANCE;  Surgeon: Donnie Mesa, MD;  Location: [place];  Service: General;  Laterality: Right;   RE-EXCISION OF BREAST LUMPECTOMY Right 08/03/2020   Procedure: RE-EXCISION OF RIGHT BREAST LUMPECTOMY SUPERIOR MARGINS;  Surgeon: Donnie Mesa, MD;  Location: [place];  Service: General;  Laterality: Right;  LMA    FAMILY HISTORY: Family History  Problem Relation Age of Onset   Cancer Maternal Grandmother 7       Unknown GYN.  ?Uterine  As of October 2021 her parents are both living, her father at age 68 and her mother at 23, . Autumn Bullock has 1 brother and 2 sisters. She reports uterine cancer in her maternal grandmother at age 82. There is no family history of breast, ovarian, or colon cancer to her knowledge.   GYNECOLOGIC HISTORY:  No LMP recorded.--Last menstrual period December 2021 Menarche: 46 years old Age at first live birth: 45 years old [place] P 1 LMP 05/05/2020, regular monthly periods, lasting 3 days with 1-2 heavy days Contraceptive: has used for 15 years, no complications HRT n/a  Hysterectomy? no BSO? 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 Husband [name] works in Nature conservation officer. She lives at home with [name] and their daughter Autumn Bullock, age 68. She attends [employer] in [place].    ADVANCED DIRECTIVES: In the absence of any documentation to the contrary, the patient's spouse is their HCPOA.    HEALTH MAINTENANCE: Social History   Tobacco Use   Smoking status: Never   Smokeless tobacco: Never  Vaping Use   Vaping Use: Never used  Substance Use Topics   Alcohol use: Yes    Comment: 7/week   Drug use: Never    Comment: Delta 8 gummies     Colonoscopy: 1999?  PAP: 05/31/2020  Bone density: never done   Allergies  Allergen Reactions   Latex    Wound Dressing Adhesive     Tape etc     Current Outpatient  Medications  Medication Sig Dispense Refill   gabapentin (NEURONTIN) 300 MG capsule Take 1 capsule (300 mg total) by mouth at bedtime. 90 capsule 4   venlafaxine XR (EFFEXOR-XR) 37.5 MG 24 hr capsule Take 1 capsule (37.5 mg total) by mouth daily with breakfast. 90 capsule 1   No current facility-administered medications for this visit.    OBJECTIVE: White woman who appears stated age 45:   06/13/21 1600  BP: 109/62  Pulse: 61  Resp: 18  Temp: 97.6 F (36.4 C)  SpO2: 100%      Body mass index is 25.63 kg/m.   Wt Readings from Last 3 Encounters:  06/13/21 154 lb (69.9 kg)  05/26/21 154 lb 4.8 oz (70 kg)  03/28/21 158 lb 6 oz (71.8 kg)     ECOG FS:1 - Symptomatic but completely ambulatory  She wanted me to check her right ear.  This is pretty plugged with wax.  LAB RESULTS:  CMP     Component Value Date/Time   NA 138 05/26/2021 1459   K 3.7 05/26/2021 1459   CL 103 05/26/2021 1459   CO2 26 05/26/2021 1459   GLUCOSE 105 (H) 05/26/2021 1459   BUN 15 05/26/2021 1459   CREATININE 1.02 (H) 05/26/2021 1459  CREATININE 0.73 08/17/2020 0905   CALCIUM 9.0 05/26/2021 1459   PROT 7.2 05/26/2021 1459   ALBUMIN 4.2 05/26/2021 1459   AST 21 05/26/2021 1459   AST 18 08/17/2020 0905   ALT 17 05/26/2021 1459   ALT 18 08/17/2020 0905   ALKPHOS 59 05/26/2021 1459   BILITOT 0.4 05/26/2021 1459   BILITOT 0.7 08/17/2020 0905   GFRNONAA >60 05/26/2021 1459   GFRNONAA >60 08/17/2020 0905    No results found for: TOTALPROTELP, ALBUMINELP, A1GS, A2GS, BETS, BETA2SER, GAMS, MSPIKE, SPEI  Lab Results  Component Value Date   WBC 3.7 (L) 05/26/2021   NEUTROABS 2.3 05/26/2021   HGB 12.1 05/26/2021   HCT 35.0 (L) 05/26/2021   MCV 97.0 05/26/2021   PLT 238 05/26/2021    No results found for: LABCA2  No components found for: PJRPZP688  No results for input(s): INR in the last 168 hours.  No results found for: LABCA2  No results found for: AYG472  No results found for:  WTK182  No results found for: EQF374  No results found for: CA2729  No components found for: HGQUANT  No results found for: CEA1 / No results found for: CEA1   No results found for: AFPTUMOR  No results found for: CHROMOGRNA  No results found for: KPAFRELGTCHN, LAMBDASER, KAPLAMBRATIO (kappa/lambda light chains)  No results found for: HGBA, HGBA2QUANT, HGBFQUANT, HGBSQUAN (Hemoglobinopathy evaluation)   No results found for: LDH  No results found for: IRON, TIBC, IRONPCTSAT (Iron and TIBC)  No results found for: FERRITIN  Urinalysis    Component Value Date/Time   COLORURINE YELLOW 09/02/2020 0843   APPEARANCEUR HAZY (A) 09/02/2020 0843   LABSPEC 1.010 09/02/2020 0843   PHURINE 6.0 09/02/2020 0843   GLUCOSEU NEGATIVE 09/02/2020 0843   HGBUR SMALL (A) 09/02/2020 0843   BILIRUBINUR NEGATIVE 09/02/2020 0843   KETONESUR NEGATIVE 09/02/2020 0843   PROTEINUR NEGATIVE 09/02/2020 0843   NITRITE NEGATIVE 09/02/2020 0843   LEUKOCYTESUR TRACE (A) 09/02/2020 0843    STUDIES: No results found.   ELIGIBLE FOR AVAILABLE RESEARCH PROTOCOL: AET  ASSESSMENT: 45 y.o. [place] woman status post right breast upper outer quadrant biopsy 06/08/2020 for a clinical mT2 N0, stage IB invasive ductal carcinoma, grade 2, estrogen and progesterone receptor positive, HER-2 not amplified, with an MIB-1 of 5%.  (1) genetics testing 06/22/2020 through the Common Hereditary Cancers Panel offered by [facility] found no deleterious mutations in APC, ATM, AXIN2, BARD1, BMPR1A, BRCA1, BRCA2, BRIP1, CDH1, CDK4, CDKN2A (p14ARF), CDKN2A (p16INK4a), CHEK2, CTNNA1, DICER1, EPCAM (Deletion/duplication testing only), GREM1 (promoter region deletion/duplication testing only), KIT, MEN1, MLH1, MSH2, MSH3, MSH6, MUTYH, NBN, NF1, NHTL1, PALB2, PDGFRA, PMS2, POLD1, POLE, PTEN, RAD50, RAD51C, RAD51D, RNF43, SDHB, SDHC, SDHD, SMAD4, SMARCA4. STK11, TP53, TSC1, TSC2, and VHL.  The following genes were evaluated for  sequence changes only: SDHA and HOXB13 c.251G>A variant only.  (a)  a variant of uncertain significance in MSH3 at c.2731T>G (p.Leu911Val).   (2) right lumpectomy and sentinel lymph node sampling 07/14/2020 showed a pT2 pN1(mic), stage IIA invasive ductal carcinoma, grade 2, with a positive superior margin  (a) additional surgery 08/03/2020  (3) MammaPrint obtained from the initial biopsy showed high risk, predicting a 93% 5-year disease-free survival with chemotherapy and significant benefit from chemotherapy (greater than 12%).  (4) cyclophosphamide and doxorubicin in dose dense fashion x4 started 10/15/2020 08/17/2020, completed 09/28/2020, followed by paclitaxel weekly x12 started 10/15/2020, completed 12/30/2020  (a) echo 07/09/2020 shows an ejection fraction in the 60-65% range.  (  5) adjuvant radiation 01/17/2021 - 02/28/2021  (6) tamoxifen started 03/17/2021, interrupted October 2022, resumed November 2022   PLAN: Tosca is benefiting from counseling and is beginning to feel "more like herself".  She does not want to give venlafaxine a second try.  She is concerned that when she goes off that medication she feels so poorly.  I think that is going to be true of any antidepressant that she tries.  After much discussion we are going to give paroxetine a try.  She understands there is a chemical interaction between paroxetine and tamoxifen but that clinically it makes absolutely no difference and that tamoxifen will continue to work well for her despite her taking this drug.  I went ahead and placed that prescription for her and she will let me know how that goes.  She has wax in her right ear.  I explained to her how to deal with that problem and how to not deal with that problem.  Also encouraged her to exercise regularly to the point of sweating at least every other day.  That is an excellent treatment for anxiety.  Otherwise she already has an appointment with me in December.  She knows  to call for any other issue that may develop before then.  Total encounter time 20 minutes.Sarajane Jews C. Georgana Romain, MD 06/13/21 5:10 PM Medical Oncology and Hematology [facility] [place], [place] [address] Tel. [phone]    Fax. [phone]   I, Wilburn Mylar, am acting as scribe for Dr. Virgie Dad. Cristobal Advani.  I, Lurline Del MD, have reviewed the above documentation for accuracy and completeness, and I agree with the above.   *Total Encounter Time as defined by the Centers for Medicare and Medicaid Services includes, in addition to the face-to-face time of a patient visit (documented in the note above) non-face-to-face time: obtaining and reviewing outside history, ordering and reviewing medications, tests or procedures, care coordination (communications with other health care professionals or caregivers) and documentation in the medical record.

## 2021-07-07 NOTE — Progress Notes (Signed)
[place] Spiritual Care Note  Met with Autumn Bullock and Autumn Bullock, who is visiting, in my office.  Autumn Bullock used the opportunity to celebrate how far she has come recently in terms of mood, motivation, and self-care, as well as to prepare questions and observations to share with Autumn Bullock this afternoon.  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 We also discussed the phenomenon of "after-school restraint collapse" that children can experience when they get home to their safe place after days filled with high and specific expectations--and that we adults probably experience our own version of this after our busy and scripted days at work.  Autumn Bullock has met the goal of using Spiritual Care as a bridge until she could find a counselor in the community and is scheduled to meet with Autumn Bullock, a previous counseling intern at [facility], on Wednesday. Autumn Bullock and I plan to follow up by phone in ca two weeks to check in about fit and progress.   [place], [place], [facility] Pager [phone] Voicemail [phone]

## 2021-07-08 ENCOUNTER — Encounter: Payer: Self-pay | Admitting: Oncology

## 2021-07-15 ENCOUNTER — Ambulatory Visit
Admission: RE | Admit: 2021-07-15 | Discharge: 2021-07-15 | Disposition: A | Discharge status: Home / Self Care | Payer: BC Managed Care – PPO | Source: Ambulatory Visit | Attending: Oncology | Admitting: Oncology

## 2021-07-15 ENCOUNTER — Encounter: Payer: Self-pay | Admitting: Oncology

## 2021-07-15 ENCOUNTER — Other Ambulatory Visit: Payer: Self-pay

## 2021-07-15 DIAGNOSIS — C50411 Malignant neoplasm of upper-outer quadrant of right female breast: Secondary | ICD-10-CM

## 2021-07-15 DIAGNOSIS — Z170 Estrogen receptor positive status [ER+]: Secondary | ICD-10-CM

## 2021-07-15 HISTORY — DX: Personal history of irradiation: Z92.3

## 2021-07-15 HISTORY — DX: Malignant neoplasm of unspecified site of unspecified female breast: C50.919

## 2021-07-20 ENCOUNTER — Ambulatory Visit: Payer: BC Managed Care – PPO | Attending: Oncology

## 2021-07-20 ENCOUNTER — Other Ambulatory Visit: Payer: Self-pay

## 2021-07-20 DIAGNOSIS — R600 Localized edema: Secondary | ICD-10-CM | POA: Insufficient documentation

## 2021-07-20 DIAGNOSIS — Z483 Aftercare following surgery for neoplasm: Secondary | ICD-10-CM | POA: Insufficient documentation

## 2021-07-20 DIAGNOSIS — Z170 Estrogen receptor positive status [ER+]: Secondary | ICD-10-CM | POA: Diagnosis present

## 2021-07-20 DIAGNOSIS — R293 Abnormal posture: Secondary | ICD-10-CM | POA: Diagnosis present

## 2021-07-20 DIAGNOSIS — C50411 Malignant neoplasm of upper-outer quadrant of right female breast: Secondary | ICD-10-CM | POA: Diagnosis present

## 2021-07-20 NOTE — Therapy (Signed)
[facility] @ [address] [place], [place], [address] Phone: [phone]   Fax:  [phone]  Physical Therapy Treatment  Patient Details  Name: LOLETA FROMMELT MRN: [id number] Date of Birth: [birth year] Referring Provider (PT): Dr. Jana Hakim   Encounter Date: 07/20/2021   PT End of Session - 07/20/21 1722     Visit Number 1    Number of Visits 9    Date for PT Re-Evaluation 08/17/21    PT Start Time 1501    PT Stop Time 7357    PT Time Calculation (min) 56 min    Activity Tolerance Patient tolerated treatment well    Behavior During Therapy [facility] for tasks assessed/performed             Past Medical History:  Diagnosis Date   Breast cancer ([place])    Cancer ([place])    breast   Family history of uterine cancer 06/16/2020   Personal history of radiation therapy    PONV (postoperative nausea and vomiting)     Past Surgical History:  Procedure Laterality Date   BREAST LUMPECTOMY     BREAST LUMPECTOMY WITH RADIOACTIVE SEED AND SENTINEL LYMPH NODE BIOPSY Right 07/14/2020   Procedure: RIGHT BREAST LUMPECTOMY WITH BRACKETED RADIOACTIVE SEED AND SENTINEL LYMPH NODE BIOPSY, BLUE DYE INJECTION;  Surgeon: Donnie Mesa, MD;  Location: [place];  Service: General;  Laterality: Right;  PEC BLOCK, BLUE DYE INJECTION   CESAREAN SECTION     2011   PORT-A-CATH REMOVAL N/A 03/17/2021   Procedure: REMOVAL PORT-A-CATH;  Surgeon: Donnie Mesa, MD;  Location: [place];  Service: General;  Laterality: N/A;   PORTACATH PLACEMENT Right 07/14/2020   Procedure: INSERTION PORT-A-CATH WITH ULTRASOUND GUIDANCE;  Surgeon: Donnie Mesa, MD;  Location: [place];  Service: General;  Laterality: Right;   RE-EXCISION OF BREAST LUMPECTOMY Right 08/03/2020   Procedure: RE-EXCISION OF RIGHT BREAST LUMPECTOMY SUPERIOR MARGINS;  Surgeon: Donnie Mesa, MD;  Location: [place];   Service: General;  Laterality: Right;  LMA    There were no vitals filed for this visit.   Subjective Assessment - 07/20/21 1509     Subjective Pt was feeling better and then after radiation her shoulder got tight again.  she has been working on it but it pulls when she reaches to cabinets.  Chest pulls with activities, and feels tight. Pt reports no swelling in her breast after radiation.  Not having any pain but annoyed because she can't reach like she wants to.    Pertinent History Patient was diagnosed on 06/02/2020 with right grade II invasive ductal carcinoma breast cancer. She underwent a right lumpectomy and sentinel node biopsy (1/1 with micromets) on 07/14/2020.  It is ER/PR positive and HER2 negative with a Ki67 of 5%. Radiation ended on 02/28/2021    Patient Stated Goals See how my arm is doing    Currently in Pain? No/denies    Multiple Pain Sites No                [facility] PT Assessment - 07/20/21 0001       Assessment   Medical Diagnosis s/p right lumpectomy and SLNB    Referring Provider (PT) Dr. Jana Hakim    Onset Date/Surgical Date 08/03/20    Hand Dominance Right    Prior Therapy Baselines      Precautions   Precaution Comments recent surgery; right arm lymphedema risk  Restrictions   Weight Bearing Restrictions No      Balance Screen   Has the patient fallen in the past 6 months No    Has the patient had a decrease in activity level because of a fear of falling?  No    Is the patient reluctant to leave their home because of a fear of falling?  No      Home Environment   Living Environment Private residence    Living Arrangements Spouse/significant other;Children   Husband and 47 y.o. 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      Cognition   Overall Cognitive  Status Within Functional Limits for tasks assessed      Observation/Other Assessments   Observations right breast incisions healed,  Pts breast appears mildly swollen at proximal and lateral breast      Posture/Postural Control   Posture/Postural Control Postural limitations    Postural Limitations Rounded Shoulders;Forward head      ROM / Strength   AROM / PROM / Strength AROM      AROM   AROM Assessment Site Shoulder    Right Shoulder Extension 48 Degrees    Right Shoulder Flexion 150 Degrees    Right Shoulder ABduction 133 Degrees    Right Shoulder Internal Rotation 45 Degrees    Right Shoulder External Rotation 105 Degrees    Left Shoulder Extension 44 Degrees    Left Shoulder Flexion 164 Degrees    Left Shoulder ABduction 180 Degrees    Left Shoulder Internal Rotation 65 Degrees    Left Shoulder External Rotation 110 Degrees      Strength   Overall Strength Within functional limits for tasks performed      Palpation   Palpation comment tender right pectorals, lats, UT/Levator                L-DEX FLOWSHEETS - 07/20/21 1500       L-DEX LYMPHEDEMA SCREENING   Measurement Type Unilateral    L-DEX MEASUREMENT EXTREMITY Upper Extremity    POSITION  Standing    DOMINANT SIDE Right    At Risk Side Right    BASELINE SCORE (UNILATERAL) -2.7    L-DEX SCORE (UNILATERAL) -2.9    VALUE CHANGE (UNILAT) -0.2                                PT Education - 07/20/21 1721     Education Details supine wand flexion and scaption, stargazer stretch, abd wall slide, cervical rotation and SB    Person(s) Educated Patient    Methods Explanation;Handout    Comprehension Returned demonstration                 PT Long Term Goals - 07/20/21 1731       PT LONG TERM GOAL #1   Title Patient will demonstrate she has regained full shoulder ROM and function compared to baselines and  initial evaluation    Time 4    Period Weeks    Status New     Target Date 08/17/21      PT LONG TERM GOAL #2   Title Pt will report decreased chest tightness by greater than 50%  Time 4    Period Weeks    Status New    Target Date 08/17/21      PT LONG TERM GOAL #3   Title Pt will be independent with HEP to improve right shoulder ROM    Time 4    Period Weeks    Status New    Target Date 08/17/21      PT LONG TERM GOAL #4   Title Pt will be independent in Right breast MLD prn to decrease swelling   Time 4    Period Weeks    Target Date 08/17/21                   Plan - 07/20/21 1725     Clinical Impression Statement Pt returns with complaints of tightness in the right chest region and limitations in Right shoulder ROM after completing radiation in June.  Performed 3 month L-Dex screen with good result.  She does have limitations in right shoulder ROM particularly for flexion and abduction.  She has tenderness at the right pectorals, lats and UT/Levator.  Cervical rotation and SB to the left is mildly limited. There appears to be some mild right breast edema that should be reassessed next visit. She will benefit from skilled PT to address deficits and return to [facility]    Stability/Clinical Decision Making Stable/Uncomplicated    Clinical Decision Making Low    Rehab Potential Excellent    PT Frequency 2x / week    PT Duration 4 weeks    PT Treatment/Interventions ADLs/Self Care Home Management;Therapeutic exercise;Patient/family education;Manual techniques;Passive range of motion;Manual lymph drainage;Orthotic Fit/Training;Scar mobilization    PT Next Visit Plan STM to pectorals, Lats, UT, scapular area, PROM, AAROM, assess right breast for swelling, review HEP given today prn    PT Home Exercise Plan supine wand flexion and scaption, stargazer stretch, abd wall slides, cervical rotation and SB    Recommended Other Services ABC class, Sleeve, continue L-Dex    Consulted and Agree with Plan of Care Patient             Patient  will benefit from skilled therapeutic intervention in order to improve the following deficits and impairments:  Postural dysfunction, Decreased range of motion, Impaired UE functional use, Decreased knowledge of precautions, Increased edema  Visit Diagnosis: Malignant neoplasm of upper-outer quadrant of right breast in female, estrogen receptor positive [facility])  Aftercare following surgery for neoplasm  Abnormal posture  Localized edema     Problem List Patient Active Problem List   Diagnosis Date Noted   Port-A-Cath in place 10/15/2020   Genetic testing 06/23/2020   Family history of uterine cancer 06/16/2020   Malignant neoplasm of upper-outer quadrant of right breast in female, estrogen receptor positive ([place]) 06/11/2020    Claris Pong, PT 07/20/2021, 5:34 PM  [facility] @ [place] [place], [place], [address] Phone: [phone]   Fax:  [phone]  Name: LEKISHA MCGHEE MRN: [id number] Date of Birth: 29-Jan-1976

## 2021-07-20 NOTE — Patient Instructions (Signed)
SHOULDER: Flexion - Supine (Cane)        Cancer Rehab [phone]    Hold cane in both hands. Raise arms up overhead. Do not allow back to arch. Hold _5__ seconds. Do __5-10__ times; __1-2__ times a day. Hands shoulder width Hands slightly wider than shoulder width. V position   Shoulder Blade Stretch    Clasp fingers behind head with elbows touching in front of face. Pull elbows back while pressing shoulder blades together. Relax and hold as tolerated, can place pillow under elbow here for comfort as needed and to allow for prolonged stretch.  Repeat __5__ times. Do __1-2__ sessions per day.

## 2021-08-08 ENCOUNTER — Inpatient Hospital Stay: Payer: BC Managed Care – PPO | Attending: Oncology

## 2021-08-08 ENCOUNTER — Other Ambulatory Visit: Payer: Self-pay

## 2021-08-08 DIAGNOSIS — Z170 Estrogen receptor positive status [ER+]: Secondary | ICD-10-CM | POA: Diagnosis not present

## 2021-08-08 DIAGNOSIS — Z79810 Long term (current) use of selective estrogen receptor modulators (SERMs): Secondary | ICD-10-CM | POA: Insufficient documentation

## 2021-08-08 DIAGNOSIS — C50411 Malignant neoplasm of upper-outer quadrant of right female breast: Secondary | ICD-10-CM | POA: Insufficient documentation

## 2021-08-08 LAB — COMPREHENSIVE METABOLIC PANEL
ALT: 18 U/L (ref 0–44)
AST: 26 U/L (ref 15–41)
Albumin: 4.2 g/dL (ref 3.5–5.0)
Alkaline Phosphatase: 71 U/L (ref 38–126)
Anion gap: 12 (ref 5–15)
BUN: 11 mg/dL (ref 6–20)
CO2: 22 mmol/L (ref 22–32)
Calcium: 8.6 mg/dL — ABNORMAL LOW (ref 8.9–10.3)
Chloride: 104 mmol/L (ref 98–111)
Creatinine, Ser: 0.79 mg/dL (ref 0.44–1.00)
GFR, Estimated: >60 mL/min (ref >60)
Glucose, Bld: 90 mg/dL (ref 70–99)
Potassium: 3.7 mmol/L (ref 3.5–5.1)
Sodium: 138 mmol/L (ref 135–145)
Total Bilirubin: 0.3 mg/dL (ref 0.3–1.2)
Total Protein: 7.3 g/dL (ref 6.5–8.1)

## 2021-08-08 LAB — CBC WITH DIFFERENTIAL/PLATELET
Abs Immature Granulocytes: 0.02 10*3/uL (ref 0.00–0.07)
Basophils Absolute: 0 10*3/uL (ref 0.0–0.1)
Basophils Relative: 0 %
Eosinophils Absolute: 0.1 10*3/uL (ref 0.0–0.5)
Eosinophils Relative: 2 %
HCT: 35.4 % — ABNORMAL LOW (ref 36.0–46.0)
Hemoglobin: 11.9 g/dL — ABNORMAL LOW (ref 12.0–15.0)
Immature Granulocytes: 1 %
Lymphocytes Relative: 22 %
Lymphs Abs: 0.8 10*3/uL (ref 0.7–4.0)
MCH: 32.1 pg (ref 26.0–34.0)
MCHC: 33.6 g/dL (ref 30.0–36.0)
MCV: 95.4 fL (ref 80.0–100.0)
Monocytes Absolute: 0.3 10*3/uL (ref 0.1–1.0)
Monocytes Relative: 9 %
Neutro Abs: 2.4 10*3/uL (ref 1.7–7.7)
Neutrophils Relative %: 66 %
Platelets: 244 10*3/uL (ref 150–400)
RBC: 3.71 MIL/uL — ABNORMAL LOW (ref 3.87–5.11)
RDW: 12.2 % (ref 11.5–15.5)
WBC: 3.6 10*3/uL — ABNORMAL LOW (ref 4.0–10.5)
nRBC: 0 % (ref 0.0–0.2)

## 2021-08-09 ENCOUNTER — Encounter: Payer: Self-pay | Admitting: General Practice

## 2021-08-09 LAB — FOLLICLE STIMULATING HORMONE: FSH: 64.9 m[IU]/mL

## 2021-08-09 NOTE — Progress Notes (Signed)
[place] Spiritual Care Note  Missed Dalisha when she stopped by yesterday, so reached her by phone briefly today. She plans to call as needed/desired.   [place], [place], [facility] Pager [phone] Voicemail [phone]

## 2021-08-10 ENCOUNTER — Ambulatory Visit: Payer: BC Managed Care – PPO | Attending: Oncology

## 2021-08-10 ENCOUNTER — Other Ambulatory Visit: Payer: Self-pay

## 2021-08-10 DIAGNOSIS — R600 Localized edema: Secondary | ICD-10-CM | POA: Insufficient documentation

## 2021-08-10 DIAGNOSIS — R293 Abnormal posture: Secondary | ICD-10-CM | POA: Insufficient documentation

## 2021-08-10 DIAGNOSIS — C50411 Malignant neoplasm of upper-outer quadrant of right female breast: Secondary | ICD-10-CM | POA: Insufficient documentation

## 2021-08-10 DIAGNOSIS — Z483 Aftercare following surgery for neoplasm: Secondary | ICD-10-CM | POA: Insufficient documentation

## 2021-08-10 DIAGNOSIS — Z170 Estrogen receptor positive status [ER+]: Secondary | ICD-10-CM | POA: Insufficient documentation

## 2021-08-10 NOTE — Therapy (Signed)
[facility] @ [facility] [place], [place], [address] Phone: [phone]   Fax:  [phone]  Physical Therapy Treatment  Patient Details  Name: Autumn Bullock MRN: [id number] Date of Birth: 10-03-1975 Referring Provider (PT): Dr. Jana Hakim   Encounter Date: 08/10/2021   PT End of Session - 08/10/21 1511     Visit Number 2    Number of Visits 9    Date for PT Re-Evaluation 08/17/21    PT Start Time 6962    PT Stop Time 9528    PT Time Calculation (min) 50 min    Activity Tolerance Patient tolerated treatment well    Behavior During Therapy [facility] for tasks assessed/performed             Past Medical History:  Diagnosis Date   Breast cancer ([place])    Cancer ([place])    breast   Family history of uterine cancer 06/16/2020   Personal history of radiation therapy    PONV (postoperative nausea and vomiting)     Past Surgical History:  Procedure Laterality Date   BREAST LUMPECTOMY     BREAST LUMPECTOMY WITH RADIOACTIVE SEED AND SENTINEL LYMPH NODE BIOPSY Right 07/14/2020   Procedure: RIGHT BREAST LUMPECTOMY WITH BRACKETED RADIOACTIVE SEED AND SENTINEL LYMPH NODE BIOPSY, BLUE DYE INJECTION;  Surgeon: Donnie Mesa, MD;  Location: [place];  Service: General;  Laterality: Right;  PEC BLOCK, BLUE DYE INJECTION   CESAREAN SECTION     2011   PORT-A-CATH REMOVAL N/A 03/17/2021   Procedure: REMOVAL PORT-A-CATH;  Surgeon: Donnie Mesa, MD;  Location: [place];  Service: General;  Laterality: N/A;   PORTACATH PLACEMENT Right 07/14/2020   Procedure: INSERTION PORT-A-CATH WITH ULTRASOUND GUIDANCE;  Surgeon: Donnie Mesa, MD;  Location: [place];  Service: General;  Laterality: Right;   RE-EXCISION OF BREAST LUMPECTOMY Right 08/03/2020   Procedure: RE-EXCISION OF RIGHT BREAST LUMPECTOMY SUPERIOR MARGINS;  Surgeon: Donnie Mesa, MD;  Location: [place];   Service: General;  Laterality: Right;  LMA    There were no vitals filed for this visit.   Subjective Assessment - 08/10/21 1505     Subjective Feel like I have more mobility in my arm, and the gym is getting easier.  I can even do better pushups now. I still feel pulling with the extremes of reaching. I can reach into my cabinet better now.    Pertinent History Patient was diagnosed on 06/02/2020 with right grade II invasive ductal carcinoma breast cancer. She underwent a right lumpectomy and sentinel node biopsy (1/1 with micromets) on 07/14/2020.  It is ER/PR positive and HER2 negative with a Ki67 of 5%. Radiation ended on 02/28/2021    Patient Stated Goals See how my arm is doing    Currently in Pain? No/denies    Multiple Pain Sites No                               [facility] Adult PT Treatment/Exercise - 08/10/21 0001       Shoulder Exercises: Supine   Other Supine Exercises wand flexion, scaption x 5      Shoulder Exercises: Pulleys   Flexion --   1:30   Scaption --   1:30   ABduction --   1:30     Manual Therapy   Soft tissue mobilization Supine to pectorals, lats, SL to serratus, lats  and scapular area and UT    Passive ROM PROM right shoulder flexion, scaption, abduction, ER                          PT Long Term Goals - 07/20/21 1731       PT LONG TERM GOAL #1   Title Patient will demonstrate she has regained full shoulder ROM and function post operatively compared to initial evaluation    Time 4    Period Weeks    Status New    Target Date 08/17/21      PT LONG TERM GOAL #2   Title Pt will report decreased chest tightness by greater than 50%    Time 4    Period Weeks    Status New    Target Date 08/17/21      PT LONG TERM GOAL #3   Title Pt will be independent with HEP to improve right shoulder ROM    Time 4    Period Weeks    Status New    Target Date 08/17/21      PT LONG TERM GOAL #4   Title Pt will be independent in  Right breast MLD prn    Time 4    Period Weeks    Target Date 08/17/21                   Plan - 08/10/21 1513     Clinical Impression Statement Pt started with pulleys which she really liked and did well with and will think about purchasing some for home use.  Performed soft tissue work in supine and SL to address tenderness and tightness and performed PROM to right shoulder.  Pt with significant improvement in right shoulder ROM and improved function. Gave pt script for compression sleeve and she will go [facility] to be fit.    Stability/Clinical Decision Making Stable/Uncomplicated    Rehab Potential Excellent    PT Frequency 2x / week    PT Duration 4 weeks    PT Treatment/Interventions ADLs/Self Care Home Management;Therapeutic exercise;Patient/family education;Manual techniques;Passive range of motion;Manual lymph drainage;Orthotic Fit/Training;Scar mobilization    PT Next Visit Plan STM to pectorals, Lats, UT, scapular area, PROM, AAROM, assess right breast for swelling, progress to strength    PT Home Exercise Plan supine wand flexion and scaption, stargazer stretch, abd wall slides, cervical rotation and SB    Recommended Other Services ABC class 12/19, gave script for sleeve    Consulted and Agree with Plan of Care Patient             Patient will benefit from skilled therapeutic intervention in order to improve the following deficits and impairments:  Postural dysfunction, Decreased range of motion, Impaired UE functional use, Decreased knowledge of precautions, Increased edema  Visit Diagnosis: Malignant neoplasm of upper-outer quadrant of right breast in female, estrogen receptor positive [facility])  Aftercare following surgery for neoplasm  Abnormal posture  Localized edema     Problem List Patient Active Problem List   Diagnosis Date Noted   Port-A-Cath in place 10/15/2020   Genetic testing 06/23/2020   Family history of uterine cancer  06/16/2020   Malignant neoplasm of upper-outer quadrant of right breast in female, estrogen receptor positive ([place]) 06/11/2020    Claris Pong, PT 08/10/2021, 4:01 PM  [facility] @ [facility] [place], [place], [address] Phone: [phone]  Fax:  [phone]  Name: Autumn Bullock MRN: [id number] Date of Birth: 1976-07-07

## 2021-08-12 LAB — ESTRADIOL, ULTRA SENS: Estradiol, Sensitive: 4 pg/mL

## 2021-08-21 NOTE — Progress Notes (Addendum)
Autumn Bullock  [phone] [phone]     ID: Autumn Bullock DOB: [birth year]  MR#: [id number]  SFS#:[id number]  Patient Care Team: Maximino Greenland as PCP - Cira Rue, RN as Oncology Nurse Navigator Rockwell Germany, RN as Oncology Nurse Navigator Donnie Mesa, MD as Consulting Physician (General Surgery) Eppie Gibson, MD as Attending Physician (Radiation Oncology) Denny Mccree, Virgie Dad, MD as Consulting Physician (Oncology) Vanessa Kick, MD as Consulting Physician (Obstetrics and Gynecology) Lovett Calender, MD as Consulting Physician (Orthopedic Surgery) Chauncey Cruel, MD OTHER MD:   CHIEF COMPLAINT: Estrogen receptor positive breast cancer  CURRENT TREATMENT: Tamoxifen   INTERVAL HISTORY: Autumn Bullock returns today for follow up of her estrogen receptor positive breast cancer.    She restarted tamoxifen at her last visit on 07/07/2021.  She is now taking it with paroxetine.  She tolerates this much better than venlafaxine and she is having no problems with either of those drugs at present.  Since her last visit, she underwent bilateral diagnostic mammography with tomography at [facility] on 07/15/2021 showing: breast density category C; no evidence of malignancy in either breast.    REVIEW OF SYSTEMS: Autumn Bullock's hair is quite strong and dark and curly.  She tells me she is having some constipation issues and she is taking 2 stool softeners daily without much success.  She is also trying to drink more water.  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  Detailed review of systems was otherwise noncontributory   COVID 19 VACCINATION STATUS: Status post [employer] x2,.  Patient also was diagnosed with COVID-19 disease November 2020.  She subsequently received 1 booster   HISTORY OF CURRENT ILLNESS: From the original intake note:  Autumn Bullock had routine screening mammography on 06/02/2020 showing a possible  abnormality in the right breast. She underwent right diagnostic mammography with tomography and right breast ultrasonography at [facility] on 06/08/2020 showing: breast density category C; 2.3 cm right breast mass at 11:30; 0.8 cm mass superficial to dominant mass; no right axillary adenopathy.  Accordingly on 06/08/2020 she proceeded to biopsy of the right breast area in question. The pathology from this procedure ([id number]) showed: invasive and in situ mammary carcinoma, grade 2, e-cadherin positive. Both biopsied masses showed this and were found to be morphologically similar. Prognostic indicators significant for: estrogen receptor, 90% positive and progesterone receptor, 70% positive, both with strong staining intensity. Proliferation marker Ki67 at 5%. HER2 negative by immunohistochemistry (1+).  The patient's subsequent history is as detailed below.   PAST MEDICAL HISTORY: Past Medical History:  Diagnosis Date   Breast cancer ([place])    Cancer ([place])    breast   Family history of uterine cancer 06/16/2020   Personal history of radiation therapy    PONV (postoperative nausea and vomiting)     PAST SURGICAL HISTORY: Past Surgical History:  Procedure Laterality Date   BREAST LUMPECTOMY     BREAST LUMPECTOMY WITH RADIOACTIVE SEED AND SENTINEL LYMPH NODE BIOPSY Right 07/14/2020   Procedure: RIGHT BREAST LUMPECTOMY WITH BRACKETED RADIOACTIVE SEED AND SENTINEL LYMPH NODE BIOPSY, BLUE DYE INJECTION;  Surgeon: Donnie Mesa, MD;  Location: [place];  Service: General;  Laterality: Right;  PEC BLOCK, BLUE DYE INJECTION   CESAREAN SECTION     2011   PORT-A-CATH REMOVAL N/A 03/17/2021   Procedure: REMOVAL PORT-A-CATH;  Surgeon: Donnie Mesa, MD;  Location: [place];  Service: General;  Laterality: N/A;  PORTACATH PLACEMENT Right 07/14/2020   Procedure: INSERTION PORT-A-CATH WITH ULTRASOUND GUIDANCE;  Surgeon: Donnie Mesa, MD;  Location: [place];  Service: General;  Laterality: Right;   RE-EXCISION OF BREAST LUMPECTOMY Right 08/03/2020   Procedure: RE-EXCISION OF RIGHT BREAST LUMPECTOMY SUPERIOR MARGINS;  Surgeon: Donnie Mesa, MD;  Location: [place];  Service: General;  Laterality: Right;  LMA    FAMILY HISTORY: Family History  Problem Relation Age of Onset   Cancer Maternal Grandmother 92       Unknown GYN.  ?Uterine  As of October 2021 her parents are both living, her father at age 7 and her mother at 77, . Autumn Bullock has 1 brother and 2 sisters. She reports uterine cancer in her maternal grandmother at age 37. There is no family history of breast, ovarian, or colon cancer to her knowledge.   GYNECOLOGIC HISTORY:  No LMP recorded.--Last menstrual period December 2021 Menarche: 45 years old Age at first live birth: 45 years old [place] P 1 LMP 05/05/2020, regular monthly periods, lasting 3 days with 1-2 heavy days Contraceptive: has used for 15 years, no complications HRT n/a  Hysterectomy? no BSO? 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 Husband Autumn Bullock works in Nature conservation officer. She lives at home with Autumn Bullock and their daughter Autumn Bullock, age 2. She attends [employer] in [place].    ADVANCED DIRECTIVES: In the absence of any documentation to the contrary, the patient's spouse is their HCPOA.    HEALTH MAINTENANCE: Social History   Tobacco Use   Smoking status: Never   Smokeless tobacco: Never  Vaping Use   Vaping Use: Never used  Substance Use Topics   Alcohol use: Yes    Comment: 7/week   Drug use: Never    Comment: Delta 8 gummies     Colonoscopy: 1999?  PAP: 05/31/2020  Bone density: never done   Allergies  Allergen Reactions   Latex    Wound Dressing Adhesive     Tape etc     Current Outpatient Medications  Medication Sig Dispense Refill   tamoxifen (NOLVADEX) 20 MG tablet Take 1 tablet (20 mg  total) by mouth daily. 90 tablet 4   PAXIL 10 MG tablet TAKE 1 TABLET DAILY. 90 tablet 4   No current facility-administered medications for this visit.    OBJECTIVE: White woman in no acute distress Vitals:   08/22/21 0955  BP: 115/65  Pulse: 64  Resp: 17  Temp: 98.1 F (36.7 C)  SpO2: 100%       Body mass index is 24.08 kg/m.   Wt Readings from Last 3 Encounters:  08/22/21 144 lb 11.2 oz (65.6 kg)  07/07/21 151 lb 1.6 oz (68.5 kg)  06/13/21 154 lb (69.9 kg)     ECOG FS:1 - Symptomatic but completely ambulatory  Sclerae unicteric, EOMs intact Wearing a mask No cervical or supraclavicular adenopathy Lungs no rales or rhonchi Heart regular rate and rhythm Abd soft, nontender, positive bowel sounds MSK no focal spinal tenderness, no upper extremity lymphedema Neuro: nonfocal, well oriented, appropriate affect Breasts: The right breast is status postlumpectomy and radiation.  There is minimal residual hyperpigmentation.  The cosmetic result is excellent.  There is no evidence of disease recurrence.  The left breast and both axillae are benign.   LAB RESULTS:  CMP     Component Value Date/Time   NA 138 08/08/2021 1534   K 3.7  08/08/2021 1534   CL 104 08/08/2021 1534   CO2 22 08/08/2021 1534   GLUCOSE 90 08/08/2021 1534   BUN 11 08/08/2021 1534   CREATININE 0.79 08/08/2021 1534   CREATININE 0.73 08/17/2020 0905   CALCIUM 8.6 (L) 08/08/2021 1534   PROT 7.3 08/08/2021 1534   ALBUMIN 4.2 08/08/2021 1534   AST 26 08/08/2021 1534   AST 18 08/17/2020 0905   ALT 18 08/08/2021 1534   ALT 18 08/17/2020 0905   ALKPHOS 71 08/08/2021 1534   BILITOT 0.3 08/08/2021 1534   BILITOT 0.7 08/17/2020 0905   GFRNONAA >60 08/08/2021 1534   GFRNONAA >60 08/17/2020 0905    No results found for: TOTALPROTELP, ALBUMINELP, A1GS, A2GS, BETS, BETA2SER, GAMS, MSPIKE, SPEI  Lab Results  Component Value Date   WBC 3.6 (L) 08/08/2021   NEUTROABS 2.4 08/08/2021   HGB 11.9 (L) 08/08/2021    HCT 35.4 (L) 08/08/2021   MCV 95.4 08/08/2021   PLT 244 08/08/2021    No results found for: LABCA2  No components found for: [id number]  No results for input(s): INR in the last 168 hours.  No results found for: LABCA2  No results found for: JKK938  No results found for: HWE993  No results found for: ZJI967  No results found for: CA2729  No components found for: HGQUANT  No results found for: CEA1 / No results found for: CEA1   No results found for: AFPTUMOR  No results found for: CHROMOGRNA  No results found for: KPAFRELGTCHN, LAMBDASER, KAPLAMBRATIO (kappa/lambda light chains)  No results found for: HGBA, HGBA2QUANT, HGBFQUANT, HGBSQUAN (Hemoglobinopathy evaluation)   No results found for: LDH  No results found for: IRON, TIBC, IRONPCTSAT (Iron and TIBC)  No results found for: FERRITIN  Urinalysis    Component Value Date/Time   COLORURINE YELLOW 09/02/2020 0843   APPEARANCEUR HAZY (A) 09/02/2020 0843   LABSPEC 1.010 09/02/2020 0843   PHURINE 6.0 09/02/2020 0843   GLUCOSEU NEGATIVE 09/02/2020 0843   HGBUR SMALL (A) 09/02/2020 0843   BILIRUBINUR NEGATIVE 09/02/2020 0843   KETONESUR NEGATIVE 09/02/2020 0843   PROTEINUR NEGATIVE 09/02/2020 0843   NITRITE NEGATIVE 09/02/2020 0843   LEUKOCYTESUR TRACE (A) 09/02/2020 0843    STUDIES: No results found.   ELIGIBLE FOR AVAILABLE RESEARCH PROTOCOL: AET  ASSESSMENT: 45 y.o. [place] woman status post right breast upper outer quadrant biopsy 06/08/2020 for a clinical mT2 N0, stage IB invasive ductal carcinoma, grade 2, estrogen and progesterone receptor positive, HER-2 not amplified, with an MIB-1 of 5%.  (1) genetics testing 06/22/2020 through the Common Hereditary Cancers Panel offered by [facility] found no deleterious mutations in APC, ATM, AXIN2, BARD1, BMPR1A, BRCA1, BRCA2, BRIP1, CDH1, CDK4, CDKN2A (p14ARF), CDKN2A (p16INK4a), CHEK2, CTNNA1, DICER1, EPCAM (Deletion/duplication testing only), GREM1  (promoter region deletion/duplication testing only), KIT, MEN1, MLH1, MSH2, MSH3, MSH6, MUTYH, NBN, NF1, NHTL1, PALB2, PDGFRA, PMS2, POLD1, POLE, PTEN, RAD50, RAD51C, RAD51D, RNF43, SDHB, SDHC, SDHD, SMAD4, SMARCA4. STK11, TP53, TSC1, TSC2, and VHL.  The following genes were evaluated for sequence changes only: SDHA and HOXB13 c.251G>A variant only.  (a)  a variant of uncertain significance in MSH3 at c.2731T>G (p.Leu911Val).   (2) right lumpectomy and sentinel lymph node sampling 07/14/2020 showed a pT2 pN1(mic), stage IIA invasive ductal carcinoma, grade 2, with a positive superior margin  (a) additional surgery 08/03/2020  (3) MammaPrint obtained from the initial biopsy showed high risk, predicting a 93% 5-year disease-free survival with chemotherapy and significant benefit from chemotherapy (greater than 12%).  (  4) cyclophosphamide and doxorubicin in dose dense fashion x4 started 10/15/2020 08/17/2020, completed 09/28/2020, followed by paclitaxel weekly x12 started 10/15/2020, completed 12/30/2020  (a) echo 07/09/2020 shows an ejection fraction in the 60-65% range.  (5) adjuvant radiation 01/17/2021 - 02/28/2021  (6) tamoxifen started 03/17/2021, interrupted October 2022, resumed November 2022  (A) [facility] and estradiol 08/08/2021 in menopausal range (64.9 and 4.0 respectively)   PLAN: Autumn Bullock is doing very well on her current combination of tamoxifen and paroxetine and the plan is to continue that a minimum of 5 years.  We have discussed the fact that paroxetine does affect the metabolism of tamoxifen but that there is no clinical data that it makes tamoxifen less effective.    She will see [place] again in April during spring break.  She can then see Dr. Georgette Dover her surgeon in October or November and she will have her next mammogram in mid November.  She knows to call for any other issue that may develop before the next visit  Total encounter time 20 minutes.Sarajane Jews C. Tacy Chavis, MD 08/22/21  10:12 AM Medical Oncology and Hematology [facility] [place], [place] [address] Tel. [phone]    Fax. [phone]   I, Wilburn Mylar, am acting as scribe for Dr. Virgie Dad. Jahaira Earnhart.  I, Lurline Del MD, have reviewed the above documentation for accuracy and completeness, and I agree with the above.   *Total Encounter Time as defined by the Centers for Medicare and Medicaid Services includes, in addition to the face-to-face time of a patient visit (documented in the note above) non-face-to-face time: obtaining and reviewing outside history, ordering and reviewing medications, tests or procedures, care coordination (communications with other health care professionals or caregivers) and documentation in the medical record.

## 2021-08-22 ENCOUNTER — Other Ambulatory Visit: Payer: Self-pay

## 2021-08-22 ENCOUNTER — Inpatient Hospital Stay (HOSPITAL_BASED_OUTPATIENT_CLINIC_OR_DEPARTMENT_OTHER): Payer: BC Managed Care – PPO | Admitting: Oncology

## 2021-08-22 VITALS — BP 115/65 | HR 64 | Temp 98.1°F | Resp 17 | Wt 144.7 lb

## 2021-08-22 DIAGNOSIS — C50411 Malignant neoplasm of upper-outer quadrant of right female breast: Secondary | ICD-10-CM

## 2021-08-22 DIAGNOSIS — Z170 Estrogen receptor positive status [ER+]: Secondary | ICD-10-CM | POA: Diagnosis not present

## 2021-08-22 MED ORDER — TAMOXIFEN CITRATE 20 MG PO TABS: 20.0000 mg | ORAL_TABLET | Freq: Every day | ORAL | 4 refills | Status: AC

## 2021-08-24 ENCOUNTER — Encounter: Payer: Self-pay | Admitting: Rehabilitation

## 2021-08-24 ENCOUNTER — Ambulatory Visit: Payer: BC Managed Care – PPO | Admitting: Rehabilitation

## 2021-08-24 ENCOUNTER — Other Ambulatory Visit: Payer: Self-pay

## 2021-08-24 DIAGNOSIS — R600 Localized edema: Secondary | ICD-10-CM

## 2021-08-24 DIAGNOSIS — C50411 Malignant neoplasm of upper-outer quadrant of right female breast: Secondary | ICD-10-CM | POA: Diagnosis not present

## 2021-08-24 DIAGNOSIS — R293 Abnormal posture: Secondary | ICD-10-CM

## 2021-08-24 DIAGNOSIS — Z483 Aftercare following surgery for neoplasm: Secondary | ICD-10-CM

## 2021-08-24 NOTE — Therapy (Addendum)
 [facility] @ [facility] [address] [place], [name], [address] Phone: [phone]   Fax:  [phone]  Physical Therapy Treatment  Patient Details  Name: Autumn Bullock MRN: [id number] Date of Birth: 04-09-1976 Referring Provider (PT): Dr. Darnelle Catalan   Encounter Date: 08/24/2021   PT End of Session - 08/24/21 1042     Visit Number 3    Number of Visits 9    Date for PT Re-Evaluation 08/17/21    PT Start Time 1000    PT Stop Time 1042    PT Time Calculation (min) 42 min    Activity Tolerance Patient tolerated treatment well    Behavior During Therapy [facility] for tasks assessed/performed             Past Medical History:  Diagnosis Date   Breast cancer ([facility])    Cancer ([facility])    breast   Family history of uterine cancer 06/16/2020   Personal history of radiation therapy    PONV (postoperative nausea and vomiting)     Past Surgical History:  Procedure Laterality Date   BREAST LUMPECTOMY     BREAST LUMPECTOMY WITH RADIOACTIVE SEED AND SENTINEL LYMPH NODE BIOPSY Right 07/14/2020   Procedure: RIGHT BREAST LUMPECTOMY WITH BRACKETED RADIOACTIVE SEED AND SENTINEL LYMPH NODE BIOPSY, BLUE DYE INJECTION;  Surgeon: Manus Rudd, MD;  Location: [facility];  Service: General;  Laterality: Right;  PEC BLOCK, BLUE DYE INJECTION   CESAREAN SECTION     2011   PORT-A-CATH REMOVAL N/A 03/17/2021   Procedure: REMOVAL PORT-A-CATH;  Surgeon: Manus Rudd, MD;  Location: [facility];  Service: General;  Laterality: N/A;   PORTACATH PLACEMENT Right 07/14/2020   Procedure: INSERTION PORT-A-CATH WITH ULTRASOUND GUIDANCE;  Surgeon: Manus Rudd, MD;  Location: [facility];  Service: General;  Laterality: Right;   RE-EXCISION OF BREAST LUMPECTOMY Right 08/03/2020   Procedure: RE-EXCISION OF RIGHT BREAST LUMPECTOMY SUPERIOR MARGINS;  Surgeon: Manus Rudd, MD;  Location: [facility];   Service: General;  Laterality: Right;  LMA    There were no vitals filed for this visit.   Subjective Assessment - 08/24/21 1004     Subjective It is a lot better.  It has some pulling in the chest put it is better.  I got some pulley for at home. I think I can be done and just keep stretching    Pertinent History Patient was diagnosed on 06/02/2020 with right grade II invasive ductal carcinoma breast cancer. She underwent a right lumpectomy and sentinel node biopsy (1/1 with micromets) on 07/14/2020.  It is ER/PR positive and HER2 negative with a Ki67 of 5%. Radiation ended on 02/28/2021    Currently in Pain? No/denies                [facility] PT Assessment - 08/24/21 0001       AROM   Right Shoulder Flexion 165 Degrees    Right Shoulder ABduction 160 Degrees                           [facility] Adult PT Treatment/Exercise - 08/24/21 0001       Manual Therapy   Soft tissue mobilization Supine to pectorals, lats, SL to serratus, lats and scapular area and UT    Passive ROM PROM right shoulder flexion, scaption, abduction, ER  PT Long Term Goals - 08/24/21 1043       PT LONG TERM GOAL #1   Title Patient will demonstrate she has regained full shoulder ROM and function post operatively compared to initial evaluation    Status Achieved      PT LONG TERM GOAL #2   Title Pt will report decreased chest tightness by greater than 50%    Status Achieved      PT LONG TERM GOAL #3   Title Pt will be independent with HEP to improve right shoulder ROM    Status Achieved      PT LONG TERM GOAL #4   Title Pt will be independent in Right breast MLD prn    Status Deferred                   Plan - 08/24/21 1042     Clinical Impression Statement Pt has met all goals and feels ready to start stretching at home and continuing [employer] bootcamp classes.  Discussed continued tightness and reasons post radiation and how stretching  remains important    PT Frequency 2x / week    PT Duration 4 weeks    PT Treatment/Interventions ADLs/Self Care Home Management;Therapeutic exercise;Patient/family education;Manual techniques;Passive range of motion;Manual lymph drainage;Orthotic Fit/Training;Scar mobilization    PT Next Visit Plan STM to pectorals, Lats, UT, scapular area, PROM, AAROM, assess right breast for swelling, progress to strength    Consulted and Agree with Plan of Care Patient             Patient will benefit from skilled therapeutic intervention in order to improve the following deficits and impairments:  Postural dysfunction, Decreased range of motion, Impaired UE functional use, Decreased knowledge of precautions, Increased edema  Visit Diagnosis: Malignant neoplasm of upper-outer quadrant of right breast in female, estrogen receptor positive [facility])  Aftercare following surgery for neoplasm  Abnormal posture  Localized edema     Problem List Patient Active Problem List   Diagnosis Date Noted   Port-A-Cath in place 10/15/2020   Genetic testing 06/23/2020   Family history of uterine cancer 06/16/2020   Malignant neoplasm of upper-outer quadrant of right breast in female, estrogen receptor positive ([facility]) 06/11/2020    Idamae Lusher, PT 08/24/2021, 10:44 AM  [facility] Outpatient & Specialty Re[facility] @ [facility] [address] [place], [name], [address] Phone: [phone]   Fax:  [phone]  Name: Autumn Bullock MRN: [id number] Date of Birth: 12/20/1975   PHYSICAL THERAPY DISCHARGE SUMMARY  Visits from Start of Care: 3  Current functional level related to goals / functional outcomes: See above   Remaining deficits: Lymphedema risk   Education / Equipment: HEP  Plan: Patient agrees to discharge.  Patient is being discharged due to meeting the stated rehab goals.

## 2021-08-30 ENCOUNTER — Encounter: Payer: BC Managed Care – PPO | Admitting: Rehabilitation

## 2021-10-18 ENCOUNTER — Encounter (HOSPITAL_COMMUNITY): Payer: Self-pay

## 2021-10-31 ENCOUNTER — Other Ambulatory Visit: Payer: Self-pay

## 2021-10-31 ENCOUNTER — Ambulatory Visit: Payer: BC Managed Care – PPO | Attending: Oncology

## 2021-10-31 VITALS — Wt 145.2 lb

## 2021-10-31 DIAGNOSIS — Z483 Aftercare following surgery for neoplasm: Secondary | ICD-10-CM | POA: Insufficient documentation

## 2021-10-31 NOTE — Therapy (Signed)
[facility] @ [address] [place], [place], [address] Phone: [phone]   Fax:  [phone]  Physical Therapy Treatment  Patient Details  Name: Autumn Bullock MRN: [id number] Date of Birth: 06/14/1976 Referring Provider (PT): Dr. Jana Hakim   Encounter Date: 10/31/2021   PT End of Session - 10/31/21 1543     Visit Number 3   # unchanged due to screen only   PT Start Time 2500    PT Stop Time 1545    PT Time Calculation (min) 4 min    Activity Tolerance Patient tolerated treatment well    Behavior During Therapy [facility] for tasks assessed/performed             Past Medical History:  Diagnosis Date   Breast cancer ([place])    Cancer ([place])    breast   Family history of uterine cancer 06/16/2020   Personal history of radiation therapy    PONV (postoperative nausea and vomiting)     Past Surgical History:  Procedure Laterality Date   BREAST LUMPECTOMY     BREAST LUMPECTOMY WITH RADIOACTIVE SEED AND SENTINEL LYMPH NODE BIOPSY Right 07/14/2020   Procedure: RIGHT BREAST LUMPECTOMY WITH BRACKETED RADIOACTIVE SEED AND SENTINEL LYMPH NODE BIOPSY, BLUE DYE INJECTION;  Surgeon: Donnie Mesa, MD;  Location: [facility];  Service: General;  Laterality: Right;  PEC BLOCK, BLUE DYE INJECTION   CESAREAN SECTION     2011   PORT-A-CATH REMOVAL N/A 03/17/2021   Procedure: REMOVAL PORT-A-CATH;  Surgeon: Donnie Mesa, MD;  Location: [facility];  Service: General;  Laterality: N/A;   PORTACATH PLACEMENT Right 07/14/2020   Procedure: INSERTION PORT-A-CATH WITH ULTRASOUND GUIDANCE;  Surgeon: Donnie Mesa, MD;  Location: [facility];  Service: General;  Laterality: Right;   RE-EXCISION OF BREAST LUMPECTOMY Right 08/03/2020   Procedure: RE-EXCISION OF RIGHT BREAST LUMPECTOMY SUPERIOR MARGINS;  Surgeon: Donnie Mesa, MD;  Location: [facility];  Service: General;  Laterality: Right;   LMA    Vitals:   10/31/21 1542  Weight: 145 lb 4 oz (65.9 kg)     Subjective Assessment - 10/31/21 1542     Subjective Pt returns for her 3 month L-Dex screen.    Pertinent History Patient was diagnosed on 06/02/2020 with right grade II invasive ductal carcinoma breast cancer. She underwent a right lumpectomy and sentinel node biopsy (1/1 with micromets) on 07/14/2020.  It is ER/PR positive and HER2 negative with a Ki67 of 5%. Radiation ended on 02/28/2021                    L-DEX FLOWSHEETS - 10/31/21 1500       L-DEX LYMPHEDEMA SCREENING   Measurement Type Unilateral    L-DEX MEASUREMENT EXTREMITY Upper Extremity    POSITION  Standing    DOMINANT SIDE Right    At Risk Side Right    BASELINE SCORE (UNILATERAL) -2.7    L-DEX SCORE (UNILATERAL) -4.9    VALUE CHANGE (UNILAT) -2.2                                     PT Long Term Goals - 08/24/21 1043       PT LONG TERM GOAL #1   Title Patient will demonstrate she has regained full shoulder ROM and function post operatively compared to initial evaluation  Status Achieved      PT LONG TERM GOAL #2   Title Pt will report decreased chest tightness by greater than 50%    Status Achieved      PT LONG TERM GOAL #3   Title Pt will be independent with HEP to improve right shoulder ROM    Status Achieved      PT LONG TERM GOAL #4   Title Pt will be independent in Right breast MLD prn    Status Deferred                   Plan - 10/31/21 1544     Clinical Impression Statement Pt returns for her 3 month L-Dex screen. Pts change from baseline of -2.2 is WNLs so no further treatment is required at this time except to cont every 3 month L-Dex screens which pt is agreeable to.    PT Next Visit Plan Cont every 3 month L-dex screens for up to 2 years from her SLNB (~07/14/2022)    Consulted and Agree with Plan of Care Patient             Patient will benefit from skilled  therapeutic intervention in order to improve the following deficits and impairments:     Visit Diagnosis: Aftercare following surgery for neoplasm     Problem List Patient Active Problem List   Diagnosis Date Noted   Port-A-Cath in place 10/15/2020   Genetic testing 06/23/2020   Family history of uterine cancer 06/16/2020   Malignant neoplasm of upper-outer quadrant of right breast in female, estrogen receptor positive ([place]) 06/11/2020    Otelia Limes, PTA 10/31/2021, 3:47 PM  [facility] @ [address] [place], [place], [address] Phone: [phone]   Fax:  [phone]  Name: Autumn Bullock MRN: [id number] Date of Birth: Nov 12, 1975

## 2021-11-22 ENCOUNTER — Telehealth: Payer: Self-pay | Admitting: Hematology and Oncology

## 2021-11-22 NOTE — Telephone Encounter (Signed)
Rescheduled appointment per providers template. Left message.  ? ?

## 2021-11-27 ENCOUNTER — Encounter: Payer: Self-pay | Admitting: Hematology and Oncology

## 2021-11-28 ENCOUNTER — Other Ambulatory Visit: Payer: Self-pay | Admitting: *Deleted

## 2021-11-28 MED ORDER — PAROXETINE HCL 10 MG PO TABS: 10.0000 mg | ORAL_TABLET | Freq: Every day | ORAL | 1 refills | Status: DC

## 2021-11-28 MED ORDER — TAMOXIFEN CITRATE 20 MG PO TABS: 20.0000 mg | ORAL_TABLET | Freq: Every day | ORAL | 1 refills | Status: DC

## 2021-12-12 ENCOUNTER — Ambulatory Visit: Payer: BC Managed Care – PPO | Admitting: Hematology and Oncology

## 2021-12-12 ENCOUNTER — Other Ambulatory Visit: Payer: BC Managed Care – PPO

## 2021-12-12 NOTE — Progress Notes (Signed)
? ?Patient Care Team: ?Maximino Greenland as PCP - General ?Mauro Kaufmann, RN as Oncology Nurse Navigator ?Rockwell Germany, RN as Oncology Nurse Navigator ?Donnie Mesa, Autumn Bullock as Consulting Physician (General Surgery) ?Eppie Gibson, Autumn Bullock as Attending Physician (Radiation Oncology) ?Magrinat, Virgie Dad, Autumn Bullock (Inactive) as Consulting Physician (Oncology) ?Vanessa Kick, Autumn Bullock as Consulting Physician (Obstetrics and Gynecology) ?Lovett Calender, Autumn Bullock as Consulting Physician (Orthopedic Surgery) ? ?DIAGNOSIS:  ?Encounter Diagnosis  ?Name Primary?  ? Malignant neoplasm of upper-outer quadrant of right breast in female, estrogen receptor positive ([place])   ? ? ?SUMMARY OF ONCOLOGIC HISTORY: ?Oncology History  ?Malignant neoplasm of upper-outer quadrant of right breast in female, estrogen receptor positive ([place])  ?06/11/2020 Initial Diagnosis  ? Malignant neoplasm of upper-outer quadrant of right breast in female, estrogen receptor positive ([place]) ?  ?06/22/2020 Genetic Testing  ? Negative genetic testing: no pathogenic variants detected in [facility] Common Hereditary Cancers Panel.  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  The report date is June 22, 2020.   ? ?The Common Hereditary Cancers Panel offered by [facility] includes sequencing and/or deletion duplication testing of the following 48 genes: APC, ATM, AXIN2, BARD1, BMPR1A, BRCA1, BRCA2, BRIP1, CDH1, CDK4, CDKN2A (p14ARF), CDKN2A (p16INK4a), CHEK2, CTNNA1, DICER1, EPCAM (Deletion/duplication testing only), GREM1 (promoter region deletion/duplication testing only), KIT, MEN1, MLH1, MSH2, MSH3, MSH6, MUTYH, NBN, NF1, NHTL1, PALB2, PDGFRA, PMS2, POLD1, POLE, PTEN, RAD50, RAD51C, RAD51D, RNF43, SDHB, SDHC, SDHD, SMAD4, SMARCA4. STK11, TP53, TSC1, TSC2, and VHL.  The following genes were evaluated for sequence changes only: SDHA and HOXB13 c.251G>A variant only. ?  ?08/17/2020 - 12/30/2020 Chemotherapy  ? Patient is on Treatment Plan : BREAST  ADJUVANT DOSE DENSE AC q14d / PACLitaxel q7d  ? ?  ?  ? ? ?CHIEF COMPLIANT: Follow-up on Tamoxifen  ? ?INTERVAL HISTORY: Autumn Bullock is a 46 y.o. with the above mention Estrogen receptor positive breast cancer. She presents to the clinic today for a follow-up. She states that she a hard time with the Tamoxifen. She had depression and anxiety. ?She states the the hot flashes is horrible. She states that she doesn't sleep well at night from the hot flashes waking her up. She states that she has skin yeast but she has been taking cream for it.  ? ?ALLERGIES:  is allergic to latex and wound dressing adhesive. ? ?MEDICATIONS:  ?Current Outpatient Medications  ?Medication Sig Dispense Refill  ? fluconazole (DIFLUCAN) 100 MG tablet Take 1 tablet (100 mg total) by mouth daily. 30 tablet 0  ? PARoxetine (PAXIL) 10 MG tablet Take 1 tablet (10 mg total) by mouth daily. 90 tablet 1  ? tamoxifen (NOLVADEX) 20 MG tablet Take 1 tablet (20 mg total) by mouth daily. 90 tablet 1  ? ?No current facility-administered medications for this visit.  ? ? ?PHYSICAL EXAMINATION: ?ECOG PERFORMANCE STATUS: 1 - Symptomatic but completely ambulatory ? ?Vitals:  ? 12/26/21 1539  ?BP: 118/75  ?Pulse: (!) 53  ?Resp: 18  ?Temp: (!) 97.2 ?F (36.2 ?C)  ?SpO2: 100%  ? ?Filed Weights  ? 12/26/21 1539  ?Weight: 152 lb 9.6 oz (69.2 kg)  ? ? ?BREAST: No palpable masses or nodules in either right or left breasts. No palpable axillary supraclavicular or infraclavicular adenopathy no breast tenderness or nipple discharge. (exam performed in the presence of a chaperone) ? ?LABORATORY DATA:  ?I have reviewed the data as listed ? ?  Latest Ref Rng & Units 12/26/2021  ?  2:53 PM 08/08/2021  ?  3:34 PM 05/26/2021  ?  2:59 PM  ?CMP  ?Glucose 70 - 99 mg/dL 95   90   105    ?BUN 6 - 20 mg/dL _0 ?Creatinine 0.44 - 1.00 mg/dL 0.85   0.79   1.02    ?Sodium 135 - 145 mmol/L 137   138   138    ?Potassium 3.5 - 5.1 mmol/L 4.1   3.7   3.7    ?Chloride 98 - 111  mmol/L 103   104   103    ?CO2 22 - 32 mmol/L _1 ?Calcium 8.9 - 10.3 mg/dL 8.9   8.6   9.0    ?Total Protein 6.5 - 8.1 g/dL 7.1   7.3   7.2    ?Total Bilirubin 0.3 - 1.2 mg/dL 0.3   0.3   0.4    ?Alkaline Phos 38 - 126 U/L 64   71   59    ?AST 15 - 41 U/L _2 ?ALT 0 - 44 U/L _3 ? ? ?Lab Results  ?Component Value Date  ? WBC 3.9 (L) 12/26/2021  ? HGB 12.6 12/26/2021  ? HCT 35.9 (L) 12/26/2021  ? MCV 97.8 12/26/2021  ? PLT 205 12/26/2021  ? NEUTROABS 2.1 12/26/2021  ? ? ?ASSESSMENT & PLAN:  ?Malignant neoplasm of upper-outer quadrant of right breast in female, estrogen receptor positive ([place]) ?Clinical10/01/2020: T2N0 stage Ib grade 2 IDC ER/PR positive HER2 negative Ki-67 5%, genetics negative ?07/14/2020: Right lumpectomy: T2N1 microscopic stage IIa grade 2 IDC with positive margin (margin clearance 08/03/2020), MammaPrint: High risk ?10/15/2020-12/30/2020: Adriamycin and Cytoxan x4 followed by Taxol weekly x12 ?01/18/2019 to 02/28/2021: Adjuvant radiation ? ?Current treatment: Tamoxifen started 03/17/2021, reduce the dosage to 10 mg daily on 12/26/2021 ?Tamoxifen toxicities: ?Severe hot flashes for which she is currently on Taxol: This is making her life miserable.  She has not been able to sleep and therefore during the daytime she is feeling drowsy and cannot function well.  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 ?I believe that she is a slow metabolizer of tamoxifen and therefore I recommended that she cut down the dose of tamoxifen in half. ? ?Breast cancer surveillance: ?1.  Breast exam 12/26/2021: Benign ?2. mammogram 07/15/2021: Benign breast density category C ? ?Return to clinic in 1 year for follow-up ? ?No orders of the defined types were placed in this encounter. ? ?The patient has a good understanding of the overall plan. she agrees with it. she will call with any problems that may develop before the next visit here. ?Total time spent: 30 mins including face to  face time and time spent for planning, charting and co-ordination of care ? ? Autumn Ohara, Autumn Bullock ?12/26/21 ? ? ? I Gardiner Coins am scribing for Dr. Lindi Adie ? ?I have reviewed the above documentation for accuracy and completeness, and I agree with the above. ?  ?

## 2021-12-22 ENCOUNTER — Other Ambulatory Visit: Payer: Self-pay | Admitting: *Deleted

## 2021-12-22 DIAGNOSIS — Z170 Estrogen receptor positive status [ER+]: Secondary | ICD-10-CM

## 2021-12-26 ENCOUNTER — Inpatient Hospital Stay: Payer: BC Managed Care – PPO | Attending: Hematology and Oncology

## 2021-12-26 ENCOUNTER — Inpatient Hospital Stay (HOSPITAL_BASED_OUTPATIENT_CLINIC_OR_DEPARTMENT_OTHER): Payer: BC Managed Care – PPO | Admitting: Hematology and Oncology

## 2021-12-26 ENCOUNTER — Other Ambulatory Visit: Payer: Self-pay

## 2021-12-26 DIAGNOSIS — Z170 Estrogen receptor positive status [ER+]: Secondary | ICD-10-CM | POA: Diagnosis not present

## 2021-12-26 DIAGNOSIS — C50411 Malignant neoplasm of upper-outer quadrant of right female breast: Secondary | ICD-10-CM | POA: Diagnosis present

## 2021-12-26 DIAGNOSIS — F419 Anxiety disorder, unspecified: Secondary | ICD-10-CM | POA: Insufficient documentation

## 2021-12-26 DIAGNOSIS — Z79810 Long term (current) use of selective estrogen receptor modulators (SERMs): Secondary | ICD-10-CM | POA: Insufficient documentation

## 2021-12-26 DIAGNOSIS — R232 Flushing: Secondary | ICD-10-CM | POA: Insufficient documentation

## 2021-12-26 DIAGNOSIS — F32A Depression, unspecified: Secondary | ICD-10-CM | POA: Insufficient documentation

## 2021-12-26 DIAGNOSIS — Z79899 Other long term (current) drug therapy: Secondary | ICD-10-CM | POA: Insufficient documentation

## 2021-12-26 LAB — CBC WITH DIFFERENTIAL (CANCER CENTER ONLY)
Abs Immature Granulocytes: 0.01 10*3/uL (ref 0.00–0.07)
Basophils Absolute: 0 10*3/uL (ref 0.0–0.1)
Basophils Relative: 1 %
Eosinophils Absolute: 0.1 10*3/uL (ref 0.0–0.5)
Eosinophils Relative: 2 %
HCT: 35.9 % — ABNORMAL LOW (ref 36.0–46.0)
Hemoglobin: 12.6 g/dL (ref 12.0–15.0)
Immature Granulocytes: 0 %
Lymphocytes Relative: 33 %
Lymphs Abs: 1.3 10*3/uL (ref 0.7–4.0)
MCH: 34.3 pg — ABNORMAL HIGH (ref 26.0–34.0)
MCHC: 35.1 g/dL (ref 30.0–36.0)
MCV: 97.8 fL (ref 80.0–100.0)
Monocytes Absolute: 0.4 10*3/uL (ref 0.1–1.0)
Monocytes Relative: 10 %
Neutro Abs: 2.1 10*3/uL (ref 1.7–7.7)
Neutrophils Relative %: 54 %
Platelet Count: 205 10*3/uL (ref 150–400)
RBC: 3.67 MIL/uL — ABNORMAL LOW (ref 3.87–5.11)
RDW: 12 % (ref 11.5–15.5)
WBC Count: 3.9 10*3/uL — ABNORMAL LOW (ref 4.0–10.5)
nRBC: 0 % (ref 0.0–0.2)

## 2021-12-26 LAB — CMP (CANCER CENTER ONLY)
ALT: 17 U/L (ref 0–44)
AST: 23 U/L (ref 15–41)
Albumin: 4.3 g/dL (ref 3.5–5.0)
Alkaline Phosphatase: 64 U/L (ref 38–126)
Anion gap: 5 (ref 5–15)
BUN: 19 mg/dL (ref 6–20)
CO2: 29 mmol/L (ref 22–32)
Calcium: 8.9 mg/dL (ref 8.9–10.3)
Chloride: 103 mmol/L (ref 98–111)
Creatinine: 0.85 mg/dL (ref 0.44–1.00)
GFR, Estimated: >60 mL/min (ref >60)
Glucose, Bld: 95 mg/dL (ref 70–99)
Potassium: 4.1 mmol/L (ref 3.5–5.1)
Sodium: 137 mmol/L (ref 135–145)
Total Bilirubin: 0.3 mg/dL (ref 0.3–1.2)
Total Protein: 7.1 g/dL (ref 6.5–8.1)

## 2021-12-26 MED ORDER — FLUCONAZOLE 100 MG PO TABS: 100.0000 mg | ORAL_TABLET | Freq: Every day | ORAL | 0 refills | Status: DC

## 2021-12-26 NOTE — Assessment & Plan Note (Signed)
Clinical10/01/2020: T2N0 stage Ib grade 2 IDC ER/PR positive HER2 negative Ki-67 5%, genetics negative ?07/14/2020: Right lumpectomy: T2N1 microscopic stage IIa grade 2 IDC with positive margin (margin clearance 08/03/2020), MammaPrint: High risk ?10/15/2020-12/30/2020: Adriamycin and Cytoxan x4 followed by Taxol weekly x12 ?01/18/2019 to 02/28/2021: Adjuvant radiation ? ?Current treatment: Tamoxifen started 03/17/2021 ?Tamoxifen toxicities: ? ?Breast cancer surveillance: ?1.  Breast exam 12/26/2021: Benign ?2. mammogram 07/15/2021: Benign breast density category C ?

## 2021-12-27 ENCOUNTER — Telehealth: Payer: Self-pay | Admitting: Hematology and Oncology

## 2021-12-27 NOTE — Telephone Encounter (Signed)
Scheduled appointment per 4/24 los. Patient is aware. ?

## 2022-01-24 ENCOUNTER — Encounter: Payer: Self-pay | Admitting: Hematology and Oncology

## 2022-02-27 ENCOUNTER — Ambulatory Visit: Payer: BC Managed Care – PPO

## 2022-04-28 ENCOUNTER — Other Ambulatory Visit: Payer: Self-pay | Admitting: *Deleted

## 2022-04-28 DIAGNOSIS — Z170 Estrogen receptor positive status [ER+]: Secondary | ICD-10-CM

## 2022-04-28 NOTE — Progress Notes (Signed)
Received call from pt with complaint of ongoing right sided rib pain.  Pt describes pain as a constant soreness that is not alleviated by rest.  Pt denies recent injury, trauma, shortness or shortness of breath.  Pt states she is very anxious considering this is the side she had breast cancer on. Per MD pt needing chest xray, labs, and [facility] visit for further evaluation and tx.  Orders placed, appt scheduled and pt verbalized understanding.

## 2022-05-02 ENCOUNTER — Other Ambulatory Visit: Payer: Self-pay

## 2022-05-02 ENCOUNTER — Inpatient Hospital Stay (HOSPITAL_BASED_OUTPATIENT_CLINIC_OR_DEPARTMENT_OTHER): Payer: BC Managed Care – PPO | Admitting: Adult Health

## 2022-05-02 ENCOUNTER — Ambulatory Visit (HOSPITAL_COMMUNITY)
Admission: RE | Admit: 2022-05-02 | Discharge: 2022-05-02 | Disposition: A | Discharge status: Home / Self Care | Payer: BC Managed Care – PPO | Source: Ambulatory Visit | Attending: Hematology and Oncology | Admitting: Hematology and Oncology

## 2022-05-02 ENCOUNTER — Inpatient Hospital Stay: Payer: BC Managed Care – PPO | Attending: Adult Health

## 2022-05-02 ENCOUNTER — Encounter: Payer: Self-pay | Admitting: Adult Health

## 2022-05-02 VITALS — BP 120/89 | HR 61 | Temp 97.7°F | Resp 16 | Ht 65.0 in | Wt 154.2 lb

## 2022-05-02 DIAGNOSIS — C50411 Malignant neoplasm of upper-outer quadrant of right female breast: Secondary | ICD-10-CM | POA: Diagnosis present

## 2022-05-02 DIAGNOSIS — Z170 Estrogen receptor positive status [ER+]: Secondary | ICD-10-CM | POA: Insufficient documentation

## 2022-05-02 DIAGNOSIS — Z79810 Long term (current) use of selective estrogen receptor modulators (SERMs): Secondary | ICD-10-CM | POA: Insufficient documentation

## 2022-05-02 LAB — CBC WITH DIFFERENTIAL (CANCER CENTER ONLY)
Abs Immature Granulocytes: 0.01 10*3/uL (ref 0.00–0.07)
Basophils Absolute: 0 10*3/uL (ref 0.0–0.1)
Basophils Relative: 1 %
Eosinophils Absolute: 0.1 10*3/uL (ref 0.0–0.5)
Eosinophils Relative: 2 %
HCT: 40 % (ref 36.0–46.0)
Hemoglobin: 14 g/dL (ref 12.0–15.0)
Immature Granulocytes: 0 %
Lymphocytes Relative: 31 %
Lymphs Abs: 1.2 10*3/uL (ref 0.7–4.0)
MCH: 33.7 pg (ref 26.0–34.0)
MCHC: 35 g/dL (ref 30.0–36.0)
MCV: 96.2 fL (ref 80.0–100.0)
Monocytes Absolute: 0.4 10*3/uL (ref 0.1–1.0)
Monocytes Relative: 9 %
Neutro Abs: 2.2 10*3/uL (ref 1.7–7.7)
Neutrophils Relative %: 57 %
Platelet Count: 232 10*3/uL (ref 150–400)
RBC: 4.16 MIL/uL (ref 3.87–5.11)
RDW: 11.9 % (ref 11.5–15.5)
WBC Count: 3.9 10*3/uL — ABNORMAL LOW (ref 4.0–10.5)
nRBC: 0 % (ref 0.0–0.2)

## 2022-05-02 LAB — CMP (CANCER CENTER ONLY)
ALT: 16 U/L (ref 0–44)
AST: 20 U/L (ref 15–41)
Albumin: 4.7 g/dL (ref 3.5–5.0)
Alkaline Phosphatase: 63 U/L (ref 38–126)
Anion gap: 5 (ref 5–15)
BUN: 17 mg/dL (ref 6–20)
CO2: 29 mmol/L (ref 22–32)
Calcium: 9.4 mg/dL (ref 8.9–10.3)
Chloride: 104 mmol/L (ref 98–111)
Creatinine: 0.73 mg/dL (ref 0.44–1.00)
GFR, Estimated: >60 mL/min (ref >60)
Glucose, Bld: 98 mg/dL (ref 70–99)
Potassium: 4.3 mmol/L (ref 3.5–5.1)
Sodium: 138 mmol/L (ref 135–145)
Total Bilirubin: 0.4 mg/dL (ref 0.3–1.2)
Total Protein: 7.6 g/dL (ref 6.5–8.1)

## 2022-05-02 NOTE — Progress Notes (Signed)
[facility] Follow up:    Debroah Loop, PA-C [address] [place] [address]   DIAGNOSIS:  Cancer Staging  Malignant neoplasm of upper-outer quadrant of right breast in female, estrogen receptor positive ([place]) Staging form: Breast, AJCC 8th Edition - Clinical stage from 06/16/2020: Stage IB (cT2, cN0, cM0, G2, ER+, PR+, HER2-) - Unsigned Stage prefix: Initial diagnosis Histologic grading system: 3 grade system Laterality: Right Staged by: Pathologist and managing physician Stage used in treatment planning: Yes National guidelines used in treatment planning: Yes Type of national guideline used in treatment planning: NCCN   SUMMARY OF ONCOLOGIC HISTORY: Oncology History  Malignant neoplasm of upper-outer quadrant of right breast in female, estrogen receptor positive ([place])  06/11/2020 Initial Diagnosis   Malignant neoplasm of upper-outer quadrant of right breast in female, estrogen receptor positive ([place])   06/22/2020 Genetic Testing   Negative genetic testing: no pathogenic variants detected in [facility] Common Hereditary Cancers Panel.  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  The report date is June 22, 2020.    The Common Hereditary Cancers Panel offered by [facility] includes sequencing and/or deletion duplication testing of the following 48 genes: APC, ATM, AXIN2, BARD1, BMPR1A, BRCA1, BRCA2, BRIP1, CDH1, CDK4, CDKN2A (p14ARF), CDKN2A (p16INK4a), CHEK2, CTNNA1, DICER1, EPCAM (Deletion/duplication testing only), GREM1 (promoter region deletion/duplication testing only), KIT, MEN1, MLH1, MSH2, MSH3, MSH6, MUTYH, NBN, NF1, NHTL1, PALB2, PDGFRA, PMS2, POLD1, POLE, PTEN, RAD50, RAD51C, RAD51D, RNF43, SDHB, SDHC, SDHD, SMAD4, SMARCA4. STK11, TP53, TSC1, TSC2, and VHL.  The following genes were evaluated for sequence changes only: SDHA and HOXB13 c.251G>A variant only.   07/14/2020 Surgery   07/14/2020: Right lumpectomy: T2N1  microscopic stage IIa grade 2 IDC with positive margin (margin clearance 08/03/2020), MammaPrint: High risk   08/17/2020 - 12/30/2020 Chemotherapy   Patient is on Treatment Plan : BREAST ADJUVANT DOSE DENSE AC q14d / PACLitaxel q7d     01/17/2021 - 02/28/2021 Radiation Therapy   Adjuvant radiation   03/17/2021 -  Anti-estrogen oral therapy   Tamoxifen daily     CURRENT THERAPY: Tamoxifen  INTERVAL HISTORY: Annlouise R Delbuono 46 y.o. female returns for urgent evaluation of right sided chest wall pain, began about 6 weeks ago.  Thought she pulled a muscle at the gym.  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  She was not exercising, and the pain worsened.  She then would wake up during the night to understand it better.  No bruising.  No pleuritic pain or shortness of breath, she can feel when raising her arm, and notes tenderness on palpation.  More insidious onset, not sharp sudden.  Sharp pain, when reaching up grabs her, wakes her up in night.  Has a chronic cough, nothing unusual or worsening.  Constant, but worse with certain aggravating factors.   Patient Active Problem List   Diagnosis Date Noted   Genetic testing 06/23/2020   Family history of uterine cancer 06/16/2020   Malignant neoplasm of upper-outer quadrant of right breast in female, estrogen receptor positive ([place]) 06/11/2020    is allergic to latex and wound dressing adhesive.  MEDICAL HISTORY: Past Medical History:  Diagnosis Date   Breast cancer ([place])    Cancer ([place])    breast   Family history of uterine cancer 06/16/2020   Personal history of radiation therapy    PONV (postoperative nausea and vomiting)    Port-A-Cath in  place 10/15/2020    SURGICAL HISTORY: Past Surgical History:  Procedure Laterality Date   BREAST LUMPECTOMY     BREAST LUMPECTOMY WITH RADIOACTIVE SEED AND SENTINEL LYMPH NODE BIOPSY Right 07/14/2020   Procedure: RIGHT BREAST  LUMPECTOMY WITH BRACKETED RADIOACTIVE SEED AND SENTINEL LYMPH NODE BIOPSY, BLUE DYE INJECTION;  Surgeon: Donnie Mesa, MD;  Location: [facility];  Service: General;  Laterality: Right;  PEC BLOCK, BLUE DYE INJECTION   CESAREAN SECTION     2011   PORT-A-CATH REMOVAL N/A 03/17/2021   Procedure: REMOVAL PORT-A-CATH;  Surgeon: Donnie Mesa, MD;  Location: [facility];  Service: General;  Laterality: N/A;   PORTACATH PLACEMENT Right 07/14/2020   Procedure: INSERTION PORT-A-CATH WITH ULTRASOUND GUIDANCE;  Surgeon: Donnie Mesa, MD;  Location: [facility];  Service: General;  Laterality: Right;   RE-EXCISION OF BREAST LUMPECTOMY Right 08/03/2020   Procedure: RE-EXCISION OF RIGHT BREAST LUMPECTOMY SUPERIOR MARGINS;  Surgeon: Donnie Mesa, MD;  Location: [facility];  Service: General;  Laterality: Right;  LMA    SOCIAL HISTORY: Social History   Socioeconomic History   Marital status: Married    Spouse name: Not on file   Number of children: Not on file   Years of education: Not on file   Highest education level: Not on file  Occupational History   Not on file  Tobacco Use   Smoking status: Never   Smokeless tobacco: Never  Vaping Use   Vaping Use: Never used  Substance and Sexual Activity   Alcohol use: Yes    Comment: 7/week   Drug use: Never    Comment: Delta 8 gummies   Sexual activity: Not Currently  Other Topics Concern   Not on file  Social History Narrative   Not on file   Social Determinants of Health   Financial Resource Strain: Low Risk  (06/16/2020)   Overall Financial Resource Strain (CARDIA)    Difficulty of Paying Living Expenses: Not hard at all  Food Insecurity: No Food Insecurity (06/16/2020)   Hunger Vital Sign    Worried About Running Out of Food in the Last Year: Never true    Ran Out of Food in the Last Year: Never true  Transportation Needs: No Transportation Needs (06/16/2020)   PRAPARE -  Hydrologist (Medical): No    Lack of Transportation (Non-Medical): No  Physical Activity: Not on file  Stress: Not on file  Social Connections: Not on file  Intimate Partner Violence: Not on file    FAMILY HISTORY: Family History  Problem Relation Age of Onset   Cancer Maternal Grandmother 39       Unknown GYN.  ?Uterine    Review of Systems  Constitutional:  Negative for appetite change, chills, fatigue, fever and unexpected weight change.  HENT:   Negative for hearing loss, lump/mass and trouble swallowing.   Eyes:  Negative for eye problems and icterus.  Respiratory:  Negative for chest tightness, cough and shortness of breath.   Cardiovascular:  Negative for chest pain, leg swelling and palpitations.  Gastrointestinal:  Negative for abdominal distention, abdominal pain, constipation, diarrhea, nausea and vomiting.  Endocrine: Negative for hot flashes.  Genitourinary:  Negative for difficulty urinating.   Musculoskeletal:  Negative for arthralgias.  Skin:  Negative for itching and rash.  Neurological:  Negative for dizziness, extremity weakness, headaches and numbness.  Hematological:  Negative for adenopathy. Does not bruise/bleed easily.  Psychiatric/Behavioral:  Negative for  depression. The patient is not nervous/anxious.       PHYSICAL EXAMINATION  ECOG PERFORMANCE STATUS: 1 - Symptomatic but completely ambulatory  Vitals:   05/02/22 1017  BP: 120/89  Pulse: 61  Resp: 16  Temp: 97.7 F (36.5 C)  SpO2: 99%    Physical Exam Constitutional:      General: She is not in acute distress.    Appearance: Normal appearance. She is not toxic-appearing.  HENT:     Head: Normocephalic and atraumatic.  Eyes:     General: No scleral icterus. Cardiovascular:     Rate and Rhythm: Normal rate and regular rhythm.     Pulses: Normal pulses.     Heart sounds: Normal heart sounds.  Pulmonary:     Effort: Pulmonary effort is normal.     Breath  sounds: Normal breath sounds.  Abdominal:     General: Abdomen is flat. Bowel sounds are normal. There is no distension.     Palpations: Abdomen is soft.     Tenderness: There is no abdominal tenderness.  Musculoskeletal:        General: No swelling.     Cervical back: Neck supple.     Comments: +TTP over right anterior lateral chest wall  Lymphadenopathy:     Cervical: No cervical adenopathy.  Skin:    General: Skin is warm and dry.     Findings: No rash.  Neurological:     General: No focal deficit present.     Mental Status: She is alert.  Psychiatric:        Mood and Affect: Mood normal.        Behavior: Behavior normal.     LABORATORY DATA:  CBC    Component Value Date/Time   WBC 3.9 (L) 05/02/2022 0938   WBC 3.6 (L) 08/08/2021 1534   RBC 4.16 05/02/2022 0938   HGB 14.0 05/02/2022 0938   HCT 40.0 05/02/2022 0938   PLT 232 05/02/2022 0938   MCV 96.2 05/02/2022 0938   MCH 33.7 05/02/2022 0938   MCHC 35.0 05/02/2022 0938   RDW 11.9 05/02/2022 0938   LYMPHSABS 1.2 05/02/2022 0938   MONOABS 0.4 05/02/2022 0938   EOSABS 0.1 05/02/2022 0938   BASOSABS 0.0 05/02/2022 0938    CMP     Component Value Date/Time   NA 138 05/02/2022 0938   K 4.3 05/02/2022 0938   CL 104 05/02/2022 0938   CO2 29 05/02/2022 0938   GLUCOSE 98 05/02/2022 0938   BUN 17 05/02/2022 0938   CREATININE 0.73 05/02/2022 0938   CALCIUM 9.4 05/02/2022 0938   PROT 7.6 05/02/2022 0938   ALBUMIN 4.7 05/02/2022 0938   AST 20 05/02/2022 0938   ALT 16 05/02/2022 0938   ALKPHOS 63 05/02/2022 0938   BILITOT 0.4 05/02/2022 0938   GFRNONAA >60 05/02/2022 0938        ASSESSMENT and THERAPY PLAN:   Malignant neoplasm of upper-outer quadrant of right breast in female, estrogen receptor positive ([place]) Pietrina is a 46 year old woman with history of stage Ib estrogen positive breast cancer diagnosed in 2021 treated with lumpectomy, adjuvant chemotherapy, adjuvant radiation, and continues on tamoxifen  daily.  Pranika has had this persistent chest pain that has been very bothersome to her.  Upon my review of her chest x-ray I do not see anything glaringly abnormal.  We will await over read however I will go ahead and order CT chest to evaluate this discomfort.  We discussed taking 1 Aleve and  1 Tylenol as much as 3 times a day as needed to help decrease inflammation and pain.   Juanisha already has follow-up scheduled with Dr. Lindi Adie in April 2024.  However we will schedule follow-up with her to touch base about her CT scan results once that gets scheduled.    All questions were answered. The patient knows to call the clinic with any problems, questions or concerns. We can certainly see the patient much sooner if necessary.  Total encounter time:20 minutes*in face-to-face visit time, chart review, lab review, care coordination, order entry, and documentation of the encounter time.    Wilber Bihari, NP 05/05/22 1:49 AM Medical Oncology and Hematology [facility] [place], [place] [address] Tel. [phone]    Fax. [phone]  *Total Encounter Time as defined by the Centers for Medicare and Medicaid Services includes, in addition to the face-to-face time of a patient visit (documented in the note above) non-face-to-face time: obtaining and reviewing outside history, ordering and reviewing medications, tests or procedures, care coordination (communications with other health care professionals or caregivers) and documentation in the medical record.

## 2022-05-05 ENCOUNTER — Ambulatory Visit (HOSPITAL_COMMUNITY)
Admission: RE | Admit: 2022-05-05 | Discharge: 2022-05-05 | Disposition: A | Discharge status: Home / Self Care | Payer: BC Managed Care – PPO | Source: Ambulatory Visit | Attending: Adult Health | Admitting: Adult Health

## 2022-05-05 DIAGNOSIS — Z170 Estrogen receptor positive status [ER+]: Secondary | ICD-10-CM | POA: Insufficient documentation

## 2022-05-05 DIAGNOSIS — C50411 Malignant neoplasm of upper-outer quadrant of right female breast: Secondary | ICD-10-CM | POA: Diagnosis present

## 2022-05-05 MED ORDER — SODIUM CHLORIDE (PF) 0.9 % IJ SOLN
INTRAMUSCULAR | Status: AC
  Filled 2022-05-05: qty 50

## 2022-05-05 MED ORDER — IOHEXOL 300 MG/ML  SOLN
75.0000 mL | Freq: Once | INTRAMUSCULAR | Status: AC | PRN
  Administered 2022-05-05: 75 mL via INTRAVENOUS

## 2022-05-05 NOTE — Assessment & Plan Note (Signed)
Autumn Bullock is a 46 year old woman with history of stage Ib estrogen positive breast cancer diagnosed in 2021 treated with lumpectomy, adjuvant chemotherapy, adjuvant radiation, and continues on tamoxifen daily.  Autumn Bullock has had this persistent chest pain that has been very bothersome to her.  Upon my review of her chest x-ray I do not see anything glaringly abnormal.  We will await over read however I will go ahead and order CT chest to evaluate this discomfort.  We discussed taking 1 Aleve and 1 Tylenol as much as 3 times a day as needed to help decrease inflammation and pain.   Autumn Bullock already has follow-up scheduled with Dr. Lindi Adie in April 2024.  However we will schedule follow-up with her to touch base about her CT scan results once that gets scheduled.

## 2022-05-11 ENCOUNTER — Encounter: Payer: Self-pay | Admitting: *Deleted

## 2022-05-11 ENCOUNTER — Other Ambulatory Visit: Payer: Self-pay | Admitting: *Deleted

## 2022-05-11 ENCOUNTER — Telehealth: Payer: Self-pay | Admitting: *Deleted

## 2022-05-11 DIAGNOSIS — Z170 Estrogen receptor positive status [ER+]: Secondary | ICD-10-CM

## 2022-05-11 MED ORDER — AZITHROMYCIN 250 MG PO TABS: ORAL_TABLET | ORAL | 0 refills | Status: DC

## 2022-05-11 NOTE — Telephone Encounter (Addendum)
Follow up w/patient post CT. Patient stated she is experiencing persistent "nagging" cough. She is in agreement with an antibiotic as recommended by provider. Z pak prescription sent to [facility] per verbal order from L. Delice Bison, NP.

## 2022-06-06 ENCOUNTER — Other Ambulatory Visit: Payer: Self-pay | Admitting: Hematology and Oncology

## 2022-06-08 IMAGING — MG MM PLC BREAST LOC DEV 1ST LESION INC*R*
8 of 9 series · 8 of 9 positions shown · non-contrast
Comparison: Previous exam(s).

CLINICAL DATA: 44-year-old with biopsy-proven multifocal invasive
ductal carcinoma involving the UPPER OUTER QUADRANT of the RIGHT
breast. The patient presents for bracketed radioactive seed
localization of the adjacent masses in anticipation of lumpectomy
which is scheduled for tomorrow.

EXAM:
MAMMOGRAPHIC GUIDED RADIOACTIVE SEED LOCALIZATION OF THE RIGHT
BREAST x 2

[R ML (1 of 5)]
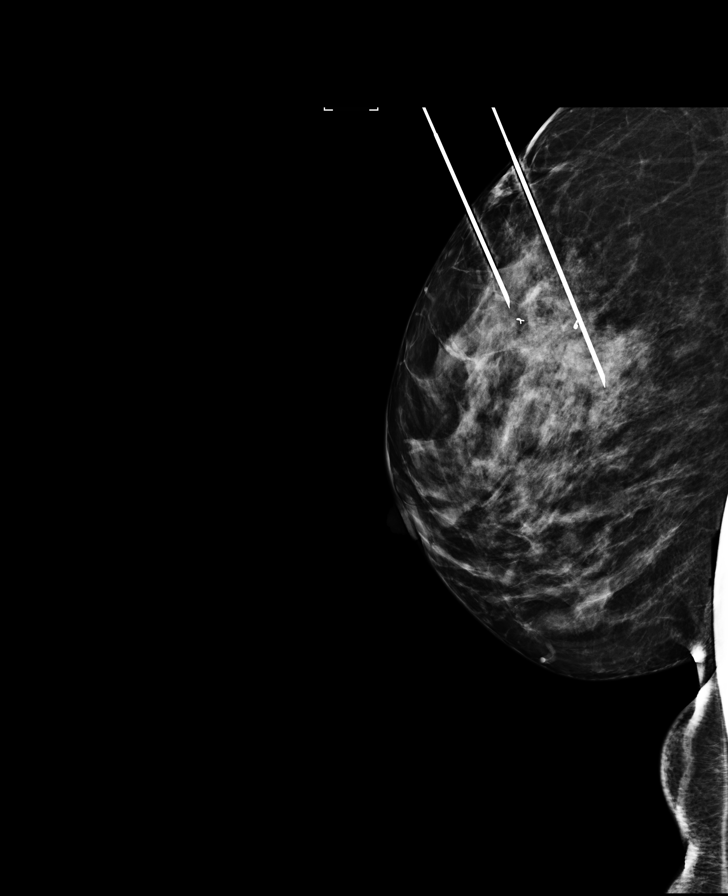

[R ML (2 of 5)]
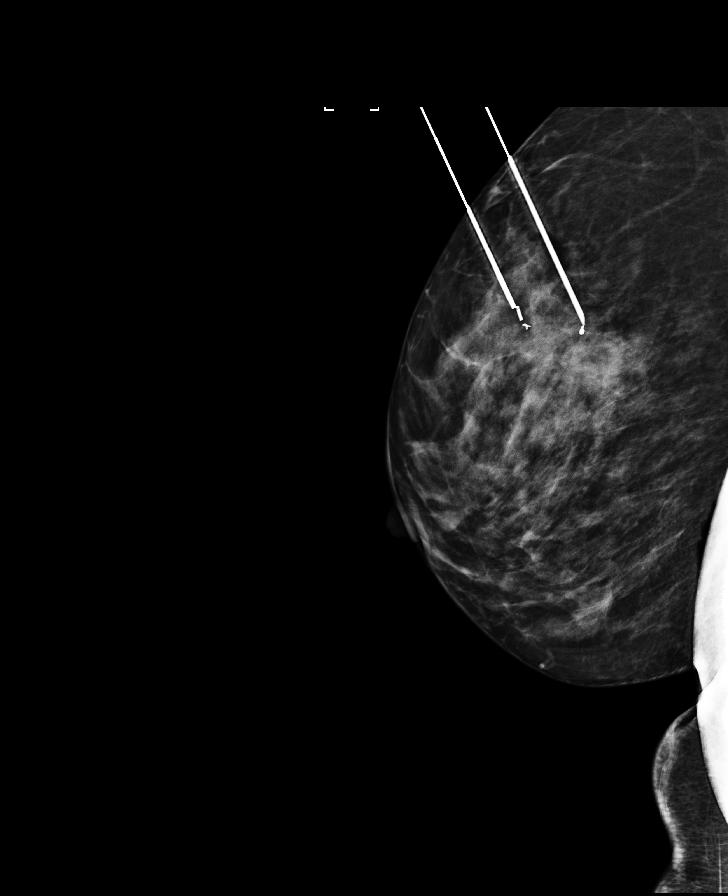

[R ML (3 of 5)]
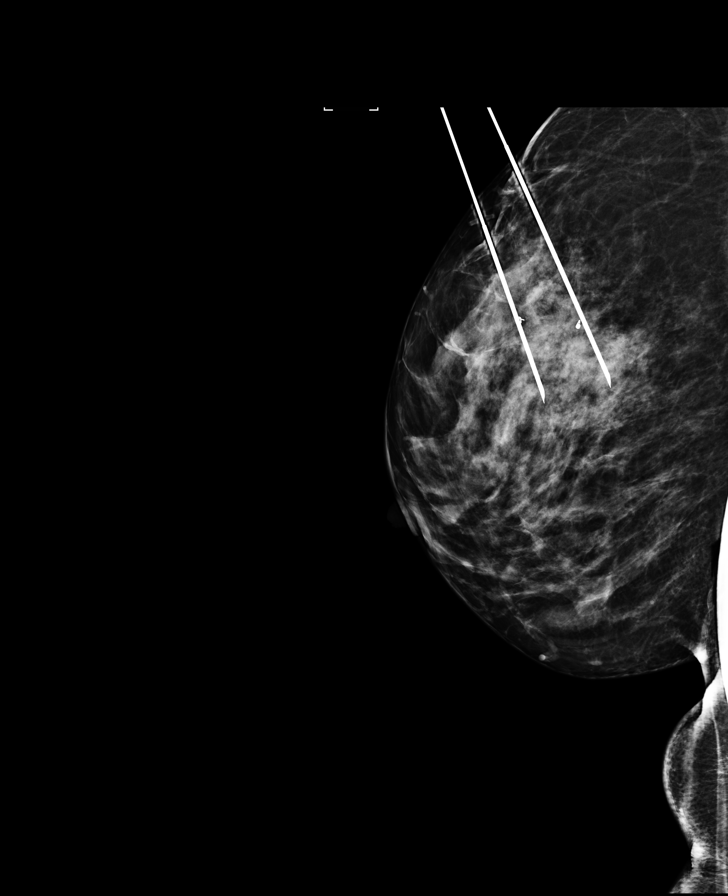

[R CC (1 of 3)]
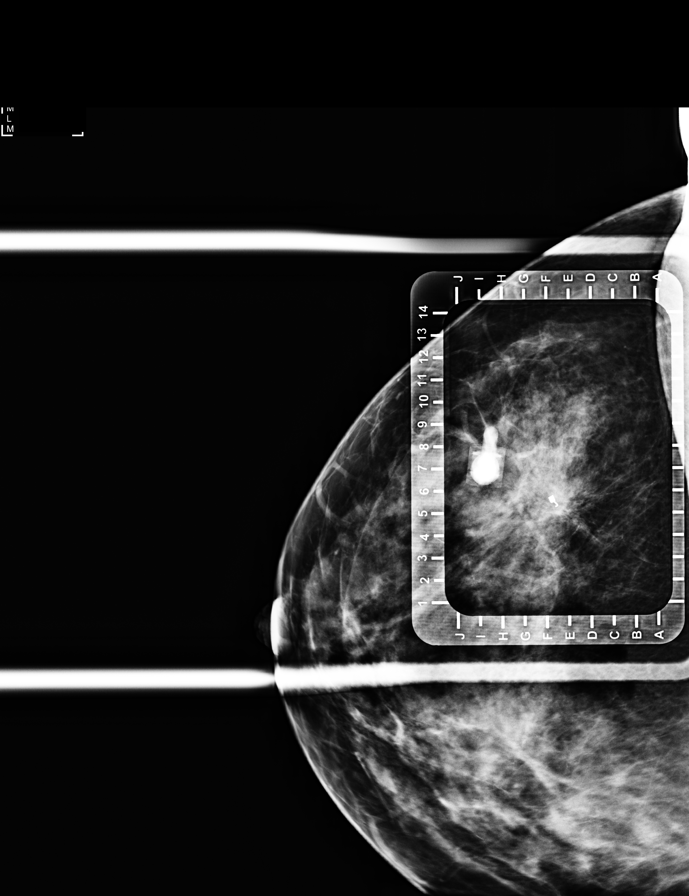

[R CC (2 of 3)]
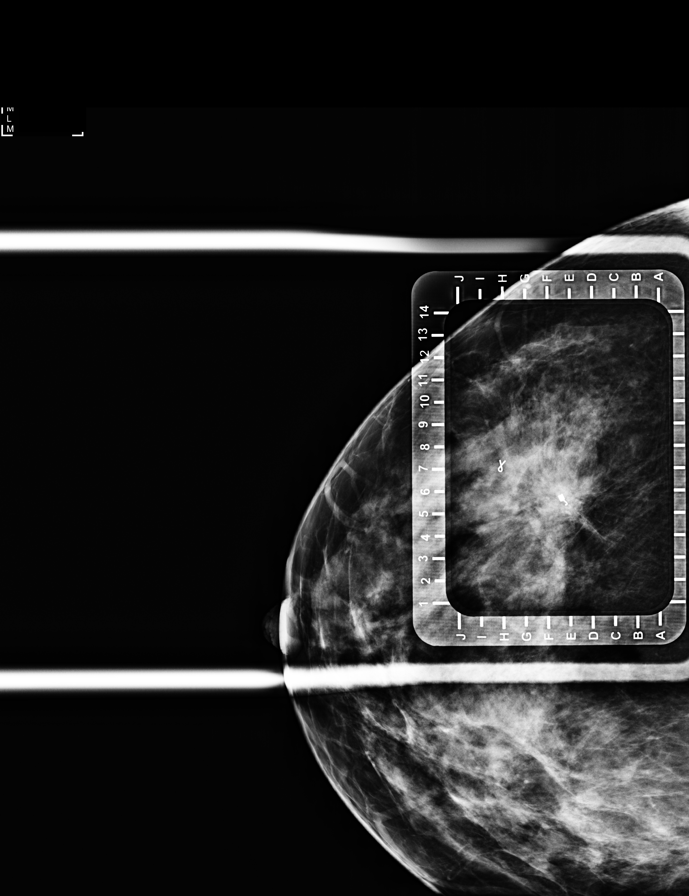

[R ML (4 of 5)]
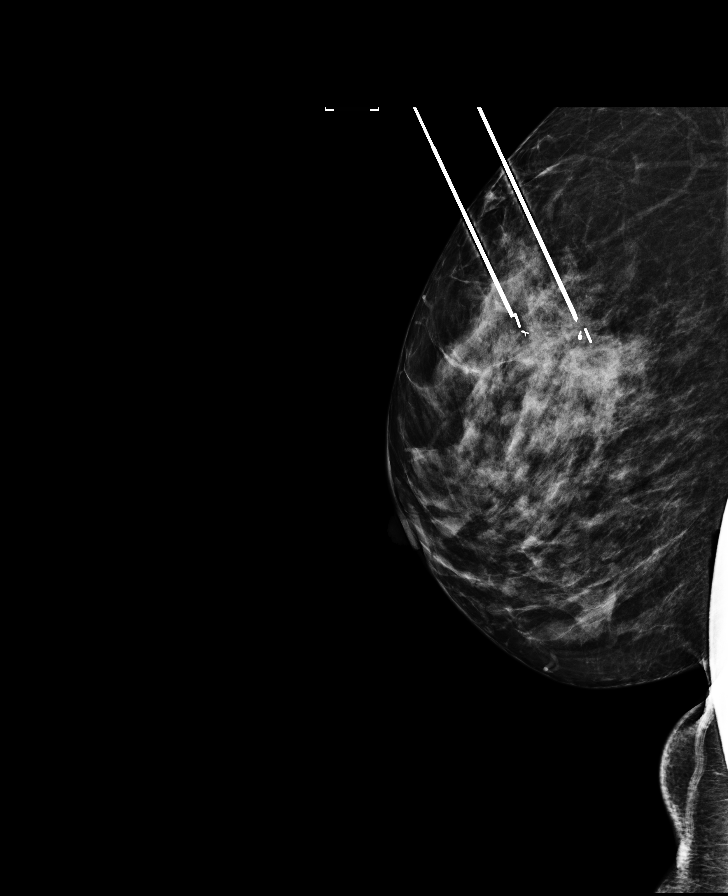

[R ML (5 of 5)]
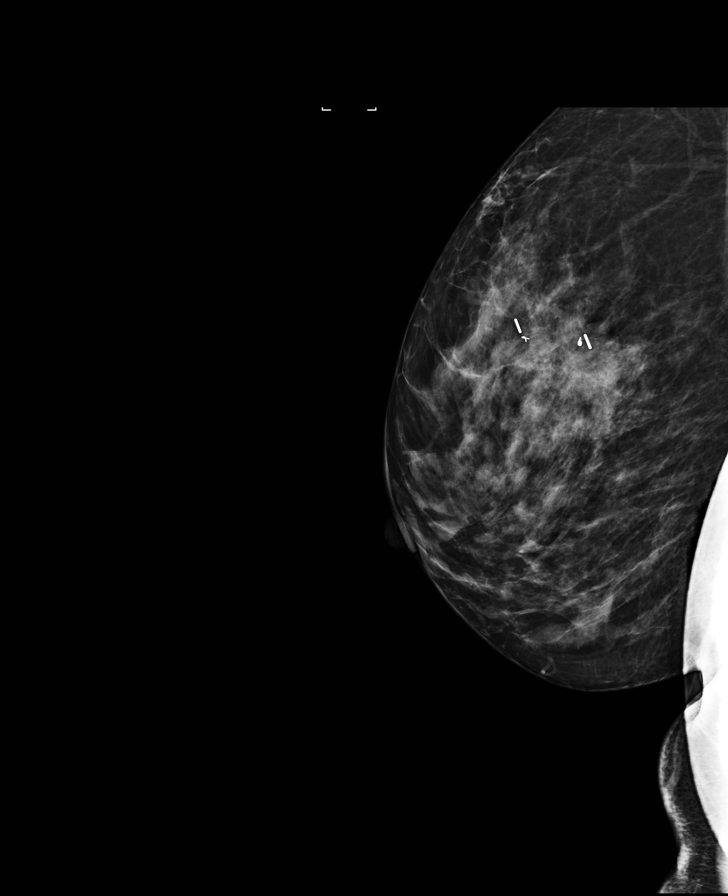

[R CC (3 of 3)]
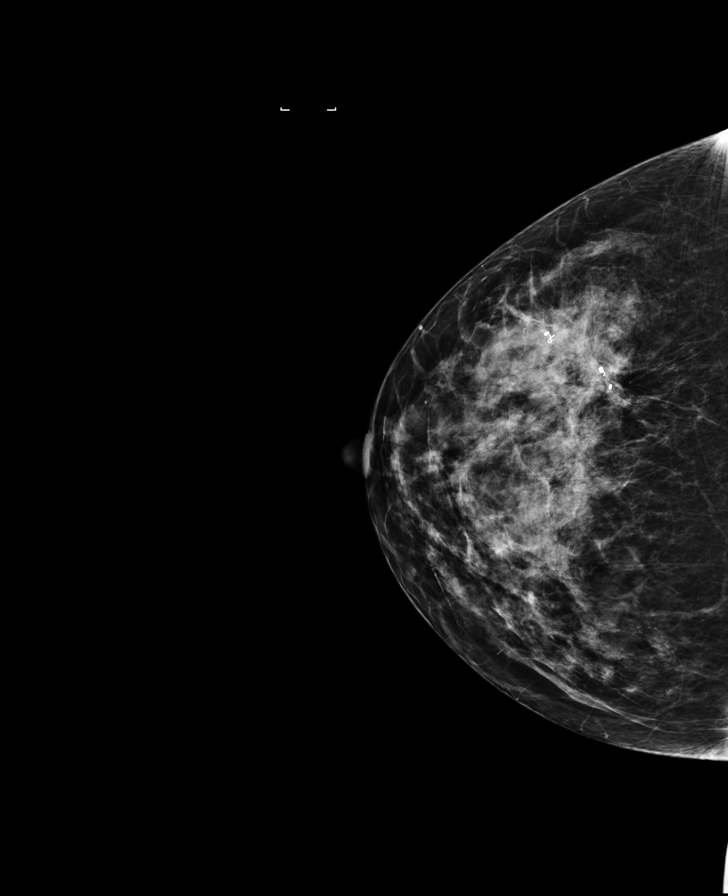

[8 of 9 positions shown; findings below may reference images not displayed]

FINDINGS: Patient presents for radioactive seed localization prior to RIGHT
breast lumpectomy. I met with the patient and we discussed the
procedure of seed localization including benefits and alternatives.
We discussed the high likelihood of a successful procedure. We
discussed the risks of the procedure including infection, bleeding,
tissue injury and further surgery. We discussed the low dose of
radioactivity involved in the procedure. Informed, written consent
was given.

The usual time-out protocol was performed immediately prior to the
procedure.

Using mammographic guidance, sterile technique with chlorhexidine as
skin antisepsis, 1% lidocaine as local anesthetic, 2 separate 7-EV1
radioactive seeds were used to bracket the ribbon shaped tissue
marker clip in the coil shaped tissue marker clip associated with
the biopsy-proven multifocal IDC in the UPPER OUTER RIGHT breast
using a superior approach. The follow-up mammogram images confirm
that the seeds are appropriately positioned, bracketing the clips
and masses. The images are marked for Dr. Antho.

Follow-up survey of the patient confirms the presence of the
radioactive seed.

Order number of 7-EV1 seed (associated with the ribbon clip
anterolaterally): [id number]

Total activity: 0.241 mCi

Reference Date: 06/25/2020

Order number of 7-EV1 seed (associated with the coil clip
posteromedially): [id number]

Total activity: 0.248 mCi

Reference Date: 06/04/2020

The patient tolerated the procedure well and was released from the
[REDACTED]. She was given instructions regarding seed removal.
IMPRESSION: Bracketed radioactive seed localization (x 2) of the multifocal
malignancy involving the UPPER OUTER QUADRANT of the RIGHT breast.
No apparent complications.

## 2022-06-15 ENCOUNTER — Other Ambulatory Visit: Payer: Self-pay | Admitting: Hematology and Oncology

## 2022-06-15 DIAGNOSIS — Z853 Personal history of malignant neoplasm of breast: Secondary | ICD-10-CM

## 2022-07-26 ENCOUNTER — Ambulatory Visit
Admission: RE | Admit: 2022-07-26 | Discharge: 2022-07-26 | Disposition: A | Discharge status: Home / Self Care | Payer: 59 | Source: Ambulatory Visit | Attending: Hematology and Oncology | Admitting: Hematology and Oncology

## 2022-07-26 DIAGNOSIS — Z853 Personal history of malignant neoplasm of breast: Secondary | ICD-10-CM

## 2022-11-22 ENCOUNTER — Other Ambulatory Visit (HOSPITAL_BASED_OUTPATIENT_CLINIC_OR_DEPARTMENT_OTHER): Payer: Self-pay

## 2022-12-15 ENCOUNTER — Encounter: Payer: Self-pay | Admitting: Adult Health

## 2022-12-25 ENCOUNTER — Telehealth: Payer: Self-pay | Admitting: Hematology and Oncology

## 2022-12-25 NOTE — Telephone Encounter (Signed)
Rescheduled appointment per room/resource. Patient is aware of the changes made to her upcoming appointment. 

## 2022-12-27 ENCOUNTER — Inpatient Hospital Stay: Payer: 59 | Attending: Hematology and Oncology | Admitting: Hematology and Oncology

## 2022-12-27 ENCOUNTER — Other Ambulatory Visit: Payer: Self-pay

## 2022-12-27 ENCOUNTER — Inpatient Hospital Stay: Payer: Self-pay | Admitting: Hematology and Oncology

## 2022-12-27 VITALS — BP 116/76 | HR 70 | Temp 97.2°F | Resp 18 | Ht 65.0 in | Wt 155.4 lb

## 2022-12-27 DIAGNOSIS — Z170 Estrogen receptor positive status [ER+]: Secondary | ICD-10-CM

## 2022-12-27 DIAGNOSIS — Z79810 Long term (current) use of selective estrogen receptor modulators (SERMs): Secondary | ICD-10-CM | POA: Insufficient documentation

## 2022-12-27 DIAGNOSIS — C50411 Malignant neoplasm of upper-outer quadrant of right female breast: Secondary | ICD-10-CM

## 2022-12-27 MED ORDER — TAMOXIFEN CITRATE 20 MG PO TABS: 10.0000 mg | ORAL_TABLET | Freq: Every day | ORAL | 3 refills | Status: DC

## 2022-12-27 MED ORDER — ESCITALOPRAM OXALATE 10 MG PO TABS: 20.0000 mg | ORAL_TABLET | Freq: Every day | ORAL | 3 refills | Status: DC

## 2022-12-27 MED ORDER — TAMOXIFEN CITRATE 10 MG PO TABS: 20.0000 mg | ORAL_TABLET | Freq: Every day | ORAL | 3 refills | Status: DC

## 2022-12-27 NOTE — Progress Notes (Signed)
Patient Care Team: Noralee Chars as PCP - Howell Rucks, MD as Consulting Physician (General Surgery) Lonie Peak, MD as Attending Physician (Radiation Oncology) Waynard Reeds, MD as Consulting Physician (Obstetrics and Gynecology) Vernell Leep, MD as Consulting Physician (Orthopedic Surgery)  DIAGNOSIS:  Encounter Diagnosis  Name Primary?   Malignant neoplasm of upper-outer quadrant of right breast in female, estrogen receptor positive Yes    SUMMARY OF ONCOLOGIC HISTORY: Oncology History  Malignant neoplasm of upper-outer quadrant of right breast in female, estrogen receptor positive  06/11/2020 Initial Diagnosis   Malignant neoplasm of upper-outer quadrant of right breast in female, estrogen receptor positive (HCC)   06/22/2020 Genetic Testing   Negative genetic testing: no pathogenic variants detected in [facility] Common Hereditary Cancers Panel.  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  The report date is June 22, 2020.    The Common Hereditary Cancers Panel offered by [facility] includes sequencing and/or deletion duplication testing of the following 48 genes: APC, ATM, AXIN2, BARD1, BMPR1A, BRCA1, BRCA2, BRIP1, CDH1, CDK4, CDKN2A (p14ARF), CDKN2A (p16INK4a), CHEK2, CTNNA1, DICER1, EPCAM (Deletion/duplication testing only), GREM1 (promoter region deletion/duplication testing only), KIT, MEN1, MLH1, MSH2, MSH3, MSH6, MUTYH, NBN, NF1, NHTL1, PALB2, PDGFRA, PMS2, POLD1, POLE, PTEN, RAD50, RAD51C, RAD51D, RNF43, SDHB, SDHC, SDHD, SMAD4, SMARCA4. STK11, TP53, TSC1, TSC2, and VHL.  The following genes were evaluated for sequence changes only: SDHA and HOXB13 c.251G>A variant only.   07/14/2020 Surgery   07/14/2020: Right lumpectomy: T2N1 microscopic stage IIa grade 2 IDC with positive margin (margin clearance 08/03/2020), MammaPrint: High risk   08/17/2020 - 12/30/2020 Chemotherapy   Patient is on Treatment Plan : BREAST ADJUVANT DOSE  DENSE AC q14d / PACLitaxel q7d     01/17/2021 - 02/28/2021 Radiation Therapy   Adjuvant radiation   03/17/2021 -  Anti-estrogen oral therapy   Tamoxifen daily     CHIEF COMPLIANT: Follow-up on tamoxifen therapy  INTERVAL HISTORY: Autumn Bullock is a 47 year old with above-mentioned history of breast cancer is currently on tamoxifen.  She is tolerating the current dosage fairly well.  Hot flashes have improved with Lexapro.  Denies any lumps or nodules in the breast.  Mammograms done November were normal.  She had menstrual cycle and is going to see a gynecologist for ultrasound and evaluation.  She has not had any menstrual cycle since chemo was completed.   ALLERGIES:  is allergic to latex, wound dressing adhesive, and wound dressings.  MEDICATIONS:  Current Outpatient Medications  Medication Sig Dispense Refill   escitalopram (LEXAPRO) 10 MG tablet Take 2 tablets (20 mg total) by mouth daily. 180 tablet 3   tamoxifen (NOLVADEX) 10 MG tablet Take 2 tablets (20 mg total) by mouth daily. 180 tablet 3   No current facility-administered medications for this visit.    PHYSICAL EXAMINATION: ECOG PERFORMANCE STATUS: 1 - Symptomatic but completely ambulatory  Vitals:   12/27/22 1536  BP: 116/76  Pulse: 70  Resp: 18  Temp: (!) 97.2 F (36.2 C)  SpO2: 100%   Filed Weights   12/27/22 1536  Weight: 155 lb 6.4 oz (70.5 kg)      LABORATORY DATA:  I have reviewed the data as listed    Latest Ref Rng & Units 05/02/2022    9:38 AM 12/26/2021    2:53 PM 08/08/2021    3:34 PM  CMP  Glucose 70 - 99 mg/dL 98  95  90   BUN 6 - 20 mg/dL 17  19  11   Creatinine 0.44 - 1.00 mg/dL 1.61  0.96  0.45   Sodium 135 - 145 mmol/L 138  137  138   Potassium 3.5 - 5.1 mmol/L 4.3  4.1  3.7   Chloride 98 - 111 mmol/L 104  103  104   CO2 22 - 32 mmol/L Calcium 8.9 - 10.3 mg/dL 9.4  8.9  8.6   Total Protein 6.5 - 8.1 g/dL 7.6  7.1  7.3   Total Bilirubin 0.3 - 1.2 mg/dL 0.4  0.3  0.3    Alkaline Phos 38 - 126 U/L 63  64  71   AST 15 - 41 U/L ALT 0 - 44 U/L Lab Results  Component Value Date   WBC 3.9 (L) 05/02/2022   HGB 14.0 05/02/2022   HCT 40.0 05/02/2022   MCV 96.2 05/02/2022   PLT 232 05/02/2022   NEUTROABS 2.2 05/02/2022    ASSESSMENT & PLAN:  Malignant neoplasm of upper-outer quadrant of right breast in female, estrogen receptor positive (HCC) Clinical10/01/2020: T2N0 stage Ib grade 2 IDC ER/PR positive HER2 negative Ki-67 5%, genetics negative 07/14/2020: Right lumpectomy: T2N1 microscopic stage IIa grade 2 IDC with positive margin (margin clearance 08/03/2020), MammaPrint: High risk 10/15/2020-12/30/2020: Adriamycin and Cytoxan x4 followed by Taxol weekly x12 01/18/2019 to 02/28/2021: Adjuvant radiation   Current treatment: Tamoxifen started 03/17/2021, reduce the dosage to 10 mg daily on 12/26/2021  Tamoxifen toxicities: Hot flashes have improved with Lexapro.  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    Breast cancer surveillance: mammogram 07/26/2022: Benign breast density category C.  She would like to continue diagnostic mammograms annually.   Return to clinic in 1 year for follow-up   Orders Placed This Encounter  Procedures   MM DIAG BREAST TOMO BILATERAL    Standing Status:   Future    Standing Expiration Date:   12/27/2023    Order Specific Question:   Reason for Exam (SYMPTOM  OR DIAGNOSIS REQUIRED)    Answer:   Annual diagnostic mammograms    Order Specific Question:   Is the patient pregnant?    Answer:   No    Order Specific Question:   Preferred imaging location?    Answer:   [facility]    Order Specific Question:   Release to patient    Answer:   Immediate   The patient has a good understanding of the overall plan. she agrees with it. she will call with any problems that may develop before the next visit here. Total time spent: 30 mins including face to face time and time spent for planning,  charting and co-ordination of care   Tamsen Meek, MD 12/27/22

## 2022-12-27 NOTE — Assessment & Plan Note (Addendum)
Clinical10/01/2020: T2N0 stage Ib grade 2 IDC ER/PR positive HER2 negative Ki-67 5%, genetics negative 07/14/2020: Right lumpectomy: T2N1 microscopic stage IIa grade 2 IDC with positive margin (margin clearance 08/03/2020), MammaPrint: High risk 10/15/2020-12/30/2020: Adriamycin and Cytoxan x4 followed by Taxol weekly x12 01/18/2019 to 02/28/2021: Adjuvant radiation   Current treatment: Tamoxifen started 03/17/2021, reduce the dosage to 10 mg daily on 12/26/2021  Tamoxifen toxicities: Hot flashes have improved with Lexapro.  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    Breast cancer surveillance: mammogram 07/26/2022: Benign breast density category C.  She would like to continue diagnostic mammograms annually.   Return to clinic in 1 year for follow-up

## 2023-04-24 NOTE — Progress Notes (Signed)
 Consepcion R Bullock presents to primary care clinic for syncope  SUBJECTIVE   Syncope:  Patient is here today for follow up on syncopal episode which occurred last night. Husband thought she was having a seizure, patient says she passed out twice, hit her head when she fell.  She reports over the last 3 months she has had several episodes of passing out, and dizziness. Felt one of the episodes was due to dehydration.  Her husband called EMS last night, the EMS did an EKG and checked her blood pressure and she was told all the tests were within normal limits.  She declined going to the ER last night.  No chest pain, no shortness of breath, no new medication.  She does not monitor her blood pressures at home.  Patient is a cancer survivor and no longer has menstrual cycles  Pt is concerned about the knot on her head, says she hit her head on the ground when she passed out last night.  1 month ago had a similar episode.Husband heard her fall last night, picked her up and she then fainted again. 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   Allergies  Allergen Reactions  . Hydrocodone-Acetaminophen  Other (See Comments)    constipation  . Wound Dressings Rash    Tape etc     Current Outpatient Medications:  .  escitalopram  (LEXAPRO ) 10 mg tablet, Take 10 mg by mouth Once Daily., Disp: 30 tablet, Rfl: 0 .  hydrocortisone-pramoxine (ANAlPRAM-HC) 2.5-1 % crea, INSERT 1 APPLICATION 3 TIMES A DAY BY RECTAL ROUTE AS NEEDED., Disp: , Rfl:  .  tamoxifen  (NOLVADEX ) 20 mg tablet, Take 10 mg by mouth Once Daily., Disp: , Rfl:  .  cyclobenzaprine (FLEXERIL) 5 mg tablet, Take 5 mg by mouth 3 (three) times a day as needed for muscle spasms. (Patient not taking: Reported on 04/24/2023), Disp: 30 tablet, Rfl: 1 .  fluconazole  (DIFLUCAN ) 100 mg tablet, Take 100 mg by mouth Once Daily. (Patient not taking: Reported on 04/24/2023), Disp: ,  Rfl:  .  ondansetron  (ZOFRAN ) 4 mg tablet, Take 4 mg by mouth as needed. (Patient not taking: Reported on 04/24/2023), Disp: , Rfl:  .  traMADoL (ULTRAM) 50 mg tablet, Take 50 mg by mouth as needed. (Patient not taking: Reported on 04/24/2023), Disp: , Rfl:   The following portions of the patient's history were reviewed and updated as appropriate: allergies, current medications, past medical history, past social history, past family history, and problem list.   ROS   See HPI.  OBJECTIVE  BP 103/68 (BP Location: Left arm, Patient Position: Sitting)   Pulse 69   Wt 61.7 kg (136 lb)   BMI 22.63 kg/m   GENERAL APPEARANCE: Well appearing, well developed, no acute distress. HEENT:  Conjunctiva without erythema or drainage. External auditory canals are non-swollen without exudate present. Tympanic membranes are normal. Nasal turbinates are without edema or drainage. Oropharynx is without lesion or exudate.       NECK: Supple without lymphadenopathy, bruit, or thyromegaly. LUNGS: Clear to auscultation bilaterally HEART: Regular rate and rhythm without murmur. ABDOMEN: Normal bowel sounds, soft, non-tender, without hepato-splenomegaly EXTREMITIES: No edema NEUROLOGIC: Alert and oriented x3, no obvious deficits.  SKIN:  No rashes or abnormal lesions visible.   EKG is wnl.  ASSESSMENT/PLAN   1. Syncope, unspecified syncope type  - CBC with Differential; Future - Comprehensive Metabolic Panel; Future - ECG 12 lead -  CBC with Differential - Comprehensive Metabolic Panel  These episodes most like vaso-vagal spells and/or postural hypotension. Less likely would be an arythmia or seizure activity, but will proceed with cardiology and neurology consults.    No follow-ups on file.  This document serves as a record of services personally performed by Alm Formica, MD.  It was created on their behalf by Merlynn Dolly, RMA, a trained medical scribe, and Registered medical Assistant (RMA).  During the course of documenting the history, physical exam and medical decision making, I was functioning as a Stage manager. The creation of this record is the provider's dictation and/or activities during the visit.  Electronically signed by Merlynn Dolly, RMA 04/24/2023 12:58 PM    I agree the documentation is accurate and complete.  Electronically signed by: Alm CHRISTELLA Formica, MD 04/24/2023 1:37 PM

## 2023-04-26 ENCOUNTER — Encounter: Payer: Self-pay | Admitting: Adult Health

## 2023-07-25 NOTE — Telephone Encounter (Signed)
Telephone call  

## 2023-07-30 ENCOUNTER — Ambulatory Visit
Admission: RE | Admit: 2023-07-30 | Discharge: 2023-07-30 | Disposition: A | Discharge status: Home / Self Care | Payer: 59 | Source: Ambulatory Visit | Attending: Hematology and Oncology | Admitting: Hematology and Oncology

## 2023-07-30 DIAGNOSIS — Z170 Estrogen receptor positive status [ER+]: Secondary | ICD-10-CM

## 2023-12-20 NOTE — Progress Notes (Signed)
 Patient presents for  Chief Complaint  Patient presents with  . Cough   SUBJECTIVE   Patient presents for cough x 4 days Associated sxs: Productive cough, post nasal drainage, congestion, runny nose, sore throat, and sneezing Denies fever, chest pain and shob Treating with Antihistamines, Flonase allergy, cough medicine, and cough drops  Sxs are worsening  Exposure: none  She has been traveling a lot recently   [place]  state and [place]  [employer]).  Cough is main sx  Thursday.  Progressively gotten worse.  She doesn't think she has any sinus prssure. She does have allergies this time of year.  She is taking eye drops and nose spray for allergies.  Cough is a new added level.  No sick exposure thtat she knows of. However, she has been around a lot of people.  Denies rashes.  She is having sore throat but ot sure if it's due to coughing.  She is keeping up with fluid okay and able to eat.  No fever.  No allergy to hydrocodone.  She has taken the hycodan cough syrup and done fine before. She takes it at night and it helps with sleep.   She takes tamoxifen  as a preventative and takes lexapro  to offset the side effects.    Allergies  Allergen Reactions  . Hydrocodone-Acetaminophen  Other (See Comments)    constipation  . Wound Dressings Rash    Tape etc     Current Outpatient Medications:  .  escitalopram  (LEXAPRO ) 10 mg tablet, Take 10 mg by mouth Once Daily., Disp: 30 tablet, Rfl: 0 .  tamoxifen  (NOLVADEX ) 20 mg tablet, Take 10 mg by mouth Once Daily., Disp: , Rfl:   The following portions of the patient's history were reviewed and updated as appropriate: allergies, current medications, past medical history, past social history, past family history, and problem list.   ROS   See HPI.  OBJECTIVE  BP 104/71 (BP Location: Left arm, Patient Position: Sitting)   Pulse 84   Ht 1.676 m (5' 6)   Wt 71.2 kg (157 lb)   SpO2 97%   BMI 25.34 kg/m    GENERAL APPEARANCE: Well developed, no acute distress. HEENT:  No pain on palpation of bilateral frontal, maxillary, or ethmoid sinus areas. Evidence of postnasal drip noted in posterior oropharynx. Cerumen obstructs visualization of right TM. Visualized portion of left TM appears unconcerning.       LUNGS: Normal work of breathing. No significantly concerning lung sounds to auscultation. HEART: Regular rate and rhythm. NEUROLOGIC: Alert and oriented x3.   ASSESSMENT/PLAN   1. Acute cough (Primary)  POCT Covid and Strep tests were negative in clinic today. See above for management.  Will treat with Zpack (Given the pt is taking Tamoxifen ) and Hycodan cough syrup as noted below.  She has taken the hycodan cough syrup and done fine before. She takes it at night and it helps with sleep.  CMP from 04/24/23 reviewed.  Follow up if no improvement in symptoms or if symptoms persist despite treatment.  - azithromycin  (ZITHROMAX ) 250 mg tablet; Take 2 tablets (500 mg total) by mouth daily for 1 day, THEN 1 tablet (250 mg total) daily for 4 days.  Dispense: 6 tablet; Refill: 0 - HYDROcodone-homatropine (HYCODAN) 5-1.5 mg/5 mL syrup; Take 5 mL by mouth every 6 (six) hours as needed for cough.  Dispense: 120 mL; Refill: 0  2. Symptoms of upper respiratory infection (URI)  POCT Covid and Strep tests were negative in clinic today. See  above for management.  - POC Rapid Strep A - POCT COVID BD  3. Screening for colon cancer  Referral to GI for colonoscopy placed for pt as noted below.  - Ambulatory referral to Gastroenterology; Future   Return if symptoms worsen or fail to improve.  This document serves as a record of services personally performed by Tawni Bucco, PA-C.  It was created on their behalf by Inocente Fabian, CMA, a trained medical scribe, and Certified Medical Assistant (CMA). During the course of documenting the history, physical exam and medical decision making, I was  functioning as a Stage manager. The creation of this record is the provider's dictation and/or activities during the visit.  Electronically signed by Inocente Fabian, CMA 12/20/2023 9:58 AM  I agree the documentation is accurate and complete.  Electronically signed by: Tawni Earnie Bucco, PA-C 01/10/2024 8:27 PM

## 2023-12-31 ENCOUNTER — Inpatient Hospital Stay: Payer: 59 | Attending: Hematology and Oncology | Admitting: Hematology and Oncology

## 2023-12-31 VITALS — BP 100/72 | HR 57 | Temp 97.6°F | Resp 18 | Wt 156.7 lb

## 2023-12-31 DIAGNOSIS — C50411 Malignant neoplasm of upper-outer quadrant of right female breast: Secondary | ICD-10-CM | POA: Diagnosis not present

## 2023-12-31 DIAGNOSIS — Z79810 Long term (current) use of selective estrogen receptor modulators (SERMs): Secondary | ICD-10-CM | POA: Insufficient documentation

## 2023-12-31 DIAGNOSIS — Z170 Estrogen receptor positive status [ER+]: Secondary | ICD-10-CM | POA: Diagnosis not present

## 2023-12-31 MED ORDER — TAMOXIFEN CITRATE 10 MG PO TABS: 20.0000 mg | ORAL_TABLET | Freq: Every day | ORAL | 3 refills | Status: AC

## 2023-12-31 MED ORDER — ESCITALOPRAM OXALATE 10 MG PO TABS: 20.0000 mg | ORAL_TABLET | Freq: Every day | ORAL | 3 refills | Status: AC

## 2023-12-31 NOTE — Progress Notes (Signed)
 Patient Care Team: Alger Anthony as PCP - Helane Lloyd, MD as Consulting Physician (General Surgery) Colie Dawes, MD as Attending Physician (Radiation Oncology) Reggy Capers, MD as Consulting Physician (Obstetrics and Gynecology) Ernesta Heading, MD as Consulting Physician (Orthopedic Surgery)  DIAGNOSIS:  Encounter Diagnosis  Name Primary?   Malignant neoplasm of upper-outer quadrant of right breast in female, estrogen receptor positive (HCC) Yes    SUMMARY OF ONCOLOGIC HISTORY: Oncology History  Malignant neoplasm of upper-outer quadrant of right breast in female, estrogen receptor positive (HCC)  06/11/2020 Initial Diagnosis   Malignant neoplasm of upper-outer quadrant of right breast in female, estrogen receptor positive (HCC)   06/22/2020 Genetic Testing   Negative genetic testing: no pathogenic variants detected in [facility] Common Hereditary Cancers Panel.  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  The report date is June 22, 2020.    The Common Hereditary Cancers Panel offered by [facility] includes sequencing and/or deletion duplication testing of the following 48 genes: APC, ATM, AXIN2, BARD1, BMPR1A, BRCA1, BRCA2, BRIP1, CDH1, CDK4, CDKN2A (p14ARF), CDKN2A (p16INK4a), CHEK2, CTNNA1, DICER1, EPCAM (Deletion/duplication testing only), GREM1 (promoter region deletion/duplication testing only), KIT, MEN1, MLH1, MSH2, MSH3, MSH6, MUTYH, NBN, NF1, NHTL1, PALB2, PDGFRA, PMS2, POLD1, POLE, PTEN, RAD50, RAD51C, RAD51D, RNF43, SDHB, SDHC, SDHD, SMAD4, SMARCA4. STK11, TP53, TSC1, TSC2, and VHL.  The following genes were evaluated for sequence changes only: SDHA and HOXB13 c.251G>A variant only.   07/14/2020 Surgery   07/14/2020: Right lumpectomy: T2N1 microscopic stage IIa grade 2 IDC with positive margin (margin clearance 08/03/2020), MammaPrint: High risk   08/17/2020 - 12/30/2020 Chemotherapy   Patient is on Treatment Plan : BREAST  ADJUVANT DOSE DENSE AC q14d / PACLitaxel  q7d     01/17/2021 - 02/28/2021 Radiation Therapy   Adjuvant radiation   03/17/2021 -  Anti-estrogen oral therapy   Tamoxifen  daily     CHIEF COMPLIANT: Follow-up on tamoxifen   HISTORY OF PRESENT ILLNESS:   History of Present Illness Autumn Bullock, a breast cancer survivor, presents for her annual follow-up. She reports no significant health issues and is currently on Tamoxifen  and Lexapro , both of which she tolerates well. She had a menstrual cycle last year, which her gynecologist attributed to a buildup of the uterine wall. An ultrasound was performed, and everything appeared normal. She was told that if it happened again, an ablation would be considered, but she has had no further episodes. 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 She also discusses her 46 year old daughter's involvement in volleyball.     ALLERGIES:  is allergic to latex, wound dressing adhesive, and wound dressings.  MEDICATIONS:  Current Outpatient Medications  Medication Sig Dispense Refill   escitalopram  (LEXAPRO ) 10 MG tablet Take 2 tablets (20 mg total) by mouth daily. 180 tablet 3   tamoxifen  (NOLVADEX ) 10 MG tablet Take 2 tablets (20 mg total) by mouth daily. 180 tablet 3   No current facility-administered medications for this visit.    PHYSICAL EXAMINATION: ECOG PERFORMANCE STATUS: 1 - Symptomatic but completely ambulatory  Vitals:   12/31/23 1115  BP: 100/72  Pulse: (!) 57  Resp: 18  Temp: 97.6 F (36.4 C)  SpO2: 100%   Filed Weights   12/31/23 1115  Weight: 156 lb 11.2 oz (71.1 kg)     LABORATORY DATA:  I have reviewed the data as listed    Latest Ref Rng & Units 05/02/2022    9:38  AM 12/26/2021    2:53 PM 08/08/2021    3:34 PM  CMP  Glucose 70 - 99 mg/dL 98  95  90   BUN 6 - 20 mg/dL 17  19  11    Creatinine 0.44 - 1.00 mg/dL 9.56  3.87  5.64   Sodium 135 - 145 mmol/L 138   137  138   Potassium 3.5 - 5.1 mmol/L 4.3  4.1  3.7   Chloride 98 - 111 mmol/L 104  103  104   CO2 22 - 32 mmol/L 29  29  22    Calcium 8.9 - 10.3 mg/dL 9.4  8.9  8.6   Total Protein 6.5 - 8.1 g/dL 7.6  7.1  7.3   Total Bilirubin 0.3 - 1.2 mg/dL 0.4  0.3  0.3   Alkaline Phos 38 - 126 U/L 63  64  71   AST 15 - 41 U/L 20  23  26    ALT 0 - 44 U/L 16  17  18      Lab Results  Component Value Date   WBC 3.9 (L) 05/02/2022   HGB 14.0 05/02/2022   HCT 40.0 05/02/2022   MCV 96.2 05/02/2022   PLT 232 05/02/2022   NEUTROABS 2.2 05/02/2022    ASSESSMENT & PLAN:  Malignant neoplasm of upper-outer quadrant of right breast in female, estrogen receptor positive (HCC) Clinical10/01/2020: T2N0 stage Ib grade 2 IDC ER/PR positive HER2 negative Ki-67 5%, genetics negative 07/14/2020: Right lumpectomy: T2N1 microscopic stage IIa grade 2 IDC with positive margin (margin clearance 08/03/2020), MammaPrint: High risk 10/15/2020-12/30/2020: Adriamycin  and Cytoxan  x4 followed by Taxol  weekly x12 01/18/2019 to 02/28/2021: Adjuvant radiation   Current treatment: Tamoxifen  started 03/17/2021, reduce the dosage to 10 mg daily on 12/26/2021   Tamoxifen  toxicities: Hot flashes have improved with Lexapro .   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  Breast cancer surveillance: mammogram 07/30/2023: Benign breast density category C.  She would like to continue diagnostic mammograms annually. Breast exam 12/23/2023: Benign I recommended guardant reveal for MRD testing  I told her that we will contact her when [facility] clinical trial becomes open. Return to clinic in 1 year for follow-up   Assessment & Plan Malignant neoplasm of upper-outer quadrant of right breast Estrogen receptor-positive breast cancer, previously treated with chemotherapy. Currently on Tamoxifen  20 mg daily without issues. Discussed potential participation in a  clinical trial for CDK inhibitors, which have shown improvement in metastatic and high-risk early-stage breast cancer. She is considered high-risk due to age and cancer type, with potential benefit in reducing recurrence. Breast density is category C, not requiring additional imaging beyond diagnostic mammograms. Introduced a new blood test for early detection of cancer DNA in the bloodstream, capable of detecting cancer a year before it appears on scans, covered by insurance or not billed by the company. - Continue Tamoxifen  20 mg oral daily. - Continue Escitalopram  20 mg oral daily. - Consider enrollment in clinical trial for CDK inhibitors when available. - Order annual diagnostic mammograms. - Enroll in blood test for early detection of cancer DNA every six months. - Renew prescriptions at [facility] in [place]. - Provide brochures on the new blood test for early detection.      No orders of the defined types were placed in this encounter.  The patient has a good understanding of the overall plan. she agrees with it. she  will call with any problems that may develop before the next visit here. Total time spent: 30 mins including face to face time and time spent for planning, charting and co-ordination of care   Margert Sheerer, MD 12/31/23

## 2023-12-31 NOTE — Assessment & Plan Note (Signed)
 Clinical10/01/2020: T2N0 stage Ib grade 2 IDC ER/PR positive HER2 negative Ki-67 5%, genetics negative 07/14/2020: Right lumpectomy: T2N1 microscopic stage IIa grade 2 IDC with positive margin (margin clearance 08/03/2020), MammaPrint: High risk 10/15/2020-12/30/2020: Adriamycin  and Cytoxan  x4 followed by Taxol  weekly x12 01/18/2019 to 02/28/2021: Adjuvant radiation   Current treatment: Tamoxifen  started 03/17/2021, reduce the dosage to 10 mg daily on 12/26/2021   Tamoxifen  toxicities: Hot flashes have improved with Lexapro .  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    Breast cancer surveillance: mammogram 07/30/2023: Benign breast density category C.  She would like to continue diagnostic mammograms annually. Breast exam 12/23/2023: Benign  Return to clinic in 1 year for follow-up

## 2024-01-02 ENCOUNTER — Telehealth: Payer: Self-pay

## 2024-01-02 NOTE — Telephone Encounter (Signed)
 Per md orders entered for Guardant Reveal and all supported documents faxed to [phone]. Faxed confirmation was received.

## 2024-01-16 ENCOUNTER — Encounter: Payer: Self-pay | Admitting: *Deleted

## 2024-01-24 ENCOUNTER — Telehealth: Payer: Self-pay | Admitting: *Deleted

## 2024-01-24 NOTE — Telephone Encounter (Signed)
Per MD request RN placed call to pt with recent Guardant Reveal results being negative.  No answer, LVM for pt to return call to the office.

## 2024-01-29 ENCOUNTER — Encounter: Payer: Self-pay | Admitting: Hematology and Oncology

## 2024-07-07 ENCOUNTER — Other Ambulatory Visit: Payer: Self-pay | Admitting: Hematology and Oncology

## 2024-07-07 DIAGNOSIS — Z9889 Other specified postprocedural states: Secondary | ICD-10-CM

## 2024-07-17 ENCOUNTER — Encounter: Payer: Self-pay | Admitting: Hematology and Oncology

## 2024-07-17 ENCOUNTER — Encounter: Payer: Self-pay | Admitting: *Deleted

## 2024-07-18 ENCOUNTER — Encounter: Payer: Self-pay | Admitting: *Deleted

## 2024-07-21 ENCOUNTER — Encounter: Payer: Self-pay | Admitting: Hematology and Oncology

## 2024-08-04 ENCOUNTER — Ambulatory Visit
Admission: RE | Admit: 2024-08-04 | Discharge: 2024-08-04 | Disposition: A | Source: Ambulatory Visit | Attending: Hematology and Oncology

## 2024-08-04 DIAGNOSIS — Z9889 Other specified postprocedural states: Secondary | ICD-10-CM

## 2024-08-04 HISTORY — DX: Personal history of antineoplastic chemotherapy: Z92.21

## 2024-12-30 ENCOUNTER — Ambulatory Visit: Admitting: Hematology and Oncology
